# Patient Record
Sex: Male | Born: 1935 | Race: White | Hispanic: No | Smoking: Former smoker
Health system: Southern US, Community
[De-identification: ages and names within clinical notes are randomized; demographics above are authoritative.]

## PROBLEM LIST (undated history)

## (undated) DIAGNOSIS — I480 Paroxysmal atrial fibrillation: Secondary | ICD-10-CM

## (undated) DIAGNOSIS — I2510 Atherosclerotic heart disease of native coronary artery without angina pectoris: Secondary | ICD-10-CM

## (undated) DIAGNOSIS — W19XXXA Unspecified fall, initial encounter: Secondary | ICD-10-CM

## (undated) DIAGNOSIS — N400 Enlarged prostate without lower urinary tract symptoms: Secondary | ICD-10-CM

## (undated) DIAGNOSIS — E039 Hypothyroidism, unspecified: Secondary | ICD-10-CM

## (undated) DIAGNOSIS — E785 Hyperlipidemia, unspecified: Secondary | ICD-10-CM

## (undated) DIAGNOSIS — C61 Malignant neoplasm of prostate: Secondary | ICD-10-CM

## (undated) DIAGNOSIS — Z978 Presence of other specified devices: Secondary | ICD-10-CM

## (undated) DIAGNOSIS — J209 Acute bronchitis, unspecified: Secondary | ICD-10-CM

## (undated) DIAGNOSIS — N390 Urinary tract infection, site not specified: Secondary | ICD-10-CM

## (undated) DIAGNOSIS — Z960 Presence of urogenital implants: Secondary | ICD-10-CM

## (undated) DIAGNOSIS — F0390 Unspecified dementia without behavioral disturbance: Secondary | ICD-10-CM

## (undated) HISTORY — PX: ANKLE SURGERY: SHX546

## (undated) HISTORY — PX: CORONARY ARTERY BYPASS GRAFT: SHX141

## (undated) HISTORY — PX: BONE GRAFT HIP ILIAC CREST: SUR159

---

## 1998-08-23 ENCOUNTER — Encounter: Payer: Self-pay | Admitting: Emergency Medicine

## 1998-08-23 ENCOUNTER — Emergency Department (HOSPITAL_COMMUNITY): Admission: EM | Admit: 1998-08-23 | Discharge: 1998-08-23 | Payer: Self-pay | Admitting: *Deleted

## 2005-02-27 ENCOUNTER — Encounter: Admission: RE | Admit: 2005-02-27 | Discharge: 2005-02-27 | Payer: Self-pay | Admitting: Internal Medicine

## 2005-03-12 ENCOUNTER — Ambulatory Visit: Payer: Self-pay | Admitting: Internal Medicine

## 2005-03-30 ENCOUNTER — Ambulatory Visit: Payer: Self-pay | Admitting: Internal Medicine

## 2005-03-30 ENCOUNTER — Encounter (INDEPENDENT_AMBULATORY_CARE_PROVIDER_SITE_OTHER): Payer: Self-pay | Admitting: Specialist

## 2005-06-26 ENCOUNTER — Ambulatory Visit (HOSPITAL_COMMUNITY): Admission: RE | Admit: 2005-06-26 | Discharge: 2005-06-26 | Payer: Self-pay | Admitting: Orthopedic Surgery

## 2005-06-26 ENCOUNTER — Ambulatory Visit (HOSPITAL_BASED_OUTPATIENT_CLINIC_OR_DEPARTMENT_OTHER): Admission: RE | Admit: 2005-06-26 | Discharge: 2005-06-26 | Payer: Self-pay | Admitting: Orthopedic Surgery

## 2005-07-19 ENCOUNTER — Ambulatory Visit (HOSPITAL_COMMUNITY): Admission: RE | Admit: 2005-07-19 | Discharge: 2005-07-19 | Payer: Self-pay | Admitting: Orthopedic Surgery

## 2005-07-19 ENCOUNTER — Ambulatory Visit (HOSPITAL_BASED_OUTPATIENT_CLINIC_OR_DEPARTMENT_OTHER): Admission: RE | Admit: 2005-07-19 | Discharge: 2005-07-19 | Payer: Self-pay | Admitting: Orthopedic Surgery

## 2005-09-04 ENCOUNTER — Emergency Department (HOSPITAL_COMMUNITY): Admission: EM | Admit: 2005-09-04 | Discharge: 2005-09-04 | Payer: Self-pay | Admitting: Family Medicine

## 2007-05-07 ENCOUNTER — Encounter: Admission: RE | Admit: 2007-05-07 | Discharge: 2007-05-07 | Payer: Self-pay | Admitting: Urology

## 2011-02-02 NOTE — Op Note (Signed)
NAME:  [name], [name] NO.:  [id number]   MEDICAL RECORD NO.:  [id number]          PATIENT TYPE:  AMB   LOCATION:  DSC                          FACILITY:  [facility]   PHYSICIAN:  Adam Nielsen, M.D. DATE OF BIRTH:  27-Jun-1925   DATE OF PROCEDURE:  06/26/2005  DATE OF DISCHARGE:                                 OPERATIVE REPORT   PREOPERATIVE DIAGNOSIS:  Right carpal tunnel syndrome, severe.   POSTOPERATIVE DIAGNOSIS:  Right carpal tunnel syndrome, severe.   OPERATIONS:  Release of right transverse carpal ligament.   OPERATIONS:  Adam Nielsen, M.D.   ASSISTANT:  Adam Maduro Dasnoit PA-C.   ANESTHESIA:  General by LMA, supervising anesthesiologist is Adam Nielsen.   INDICATIONS:  Adam Nielsen is a 77 year old gentleman referred through the  courtesy of Adam Nielsen, M.D., for evaluation and management of hand  numbness and discomfort.   Clinical examination revealed signs of chronic carpal tunnel syndrome and  thenar atrophy.   Electrodiagnostic studies completed by Adam Nielsen revealed severe right  carpal tunnel syndrome and moderate left carpal tunnel syndrome.   Due to a failure to respond to nonoperative measures, he is brought to the  operating room at this time for release of his right transverse carpal  ligament.   PROCEDURE:  Adam Nielsen is brought to the operating room and placed in the  supine position on the operating table.   Following an anesthesia consultation by Adam Nielsen, general anesthesia by LMA  technique was selected.   Following induction under Adam Nielsen direct supervision, the right arm was  prepped with Betadine soap and solution and sterilely draped.   The right arm was exsanguinated with an Esmarch bandage, an arterial  tourniquet on the proximal brachium inflated to 220 mmHg.  The procedure  commenced with a short incision in the line of the ring finger in the palm.  The subcutaneous tissues were carefully divided  revealing the palmar fascia.  Adam Nielsen had a variant of palmar fascia where he had essentially two  discrete layers, one an extension of the palmaris longus, the second a  fascial layer that blended with the transverse carpal ligament distally.   The transverse carpal ligament distal margin was carefully dissected free,  followed by release of the volar forearm fascia as a single layer proximally  in the forearm and after identification of the common sensory branches of  the median nerve, the ulnar aspect of the transverse carpal ligament was  released with scissors under direct vision into the distal forearm.   This widely opened carpal canal.   One interesting notation during surgery was that Adam Nielsen had chronic  fasciculations of his hypothenar and thenar muscles.   There has been no mention of this predicament previously.  He did not have  an EMG provided preoperatively.   The wound was inspected for bleeding points, which were electrocauterized  with bipolar current, followed by repair of the skin with intradermal 3-0  Prolene suture.   A compressive dressing was applied with a volar plaster splint maintaining  the wrist in 5 degrees  of dorsiflexion.   For aftercare, he is provided a prescription for Percocet 5 mg one .p.o.a.4-  66h. p.r.n. pain, a total of 20 tablets without refill.   He is discharged to the care of his family with instructions to return to  our office for follow-up in six to eight days.      Adam Fitch Nielsen, M.D.  Electronically Signed     RVS/MEDQ  D:  06/26/2005  T:  06/26/2005  Job:  [id number]   cc:   Adam Nielsen, M.D.  Fax: [phone]

## 2011-02-02 NOTE — Op Note (Signed)
NAME:  Adam Nielsen, Adam Nielsen NO.:  [id number]   MEDICAL RECORD NO.:  [id number]          PATIENT TYPE:  AMB   LOCATION:  DSC                          FACILITY:  [facility]   PHYSICIAN:  Katy Fitch. Sypher, M.D. DATE OF BIRTH:  1925-06-04   DATE OF PROCEDURE:  07/19/2005  DATE OF DISCHARGE:                                 OPERATIVE REPORT   PREOP DIAGNOSIS:  Entrapment neuropathy, median nerve left carpal tunnel.   POSTOP DIAGNOSIS:  Entrapment neuropathy, median nerve left carpal tunnel.   OPERATION:  Release of left transverse carpal ligament.   OPERATIONS:  Josephine Igo.   ASSISTANT:  Annye Rusk PA-C.   ANESTHESIA:  General by LMA supervising anesthesiologist is Dr. Krista Blue.   INDICATIONS:  Martese Vanatta is a 77 year old gentleman referred by Dr. Othelia Pulling for evaluation and management of a painful and numb left hand.  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 Electrodiagnostic  confirmed significant carpal tunnel syndrome.   Due to failure to respond to nonoperative measures, he is brought to the  operating at this time for release of left transverse carpal ligament.   PROCEDURE:  Jakari Sada is brought to the operating room and placed supine  position on the operating table.   Following induction general anesthesia by LMA technique, the left arm was  prepped with Betadine soap solution, sterilely draped.   Following exsanguination of limb with Esmarch bandage, arterial tourniquet  was inflated to 220 mmHg. The procedure commenced with short incision in  line of the ring finger in the palm. The subcutaneous tissues were carefully  divided to revealing palmar fascia. This was noted to be double layered as  it was on the right side previously. After rather detailed dissection of the  distal margin of the transverse carpal ligament and palmar fascia, there was  noted to be a connection between the palmaris longus and the palmar fascia  that was two layered.   Care was taken to identify the motor branch of the median nerve distally and  separate the fascia layers from the motor branch. The superficial palmar  arch and ulnar artery were isolated.   The transverse carpal ligament distal margin was then identified and  subsequently released with scissors including release of the entire  transverse carpal ligament along its ulnar border extending into the distal  forearm.   This widely opened the carpal canal. The contents of the carpal canal  inspected and found to reveal no masses or other predicaments other than a  fibrotic ulnar bursa.   Bleeding points along the margin of the released ligament were  electrocauterized with bipolar current followed by repair of the palmar skin  wound with intradermal 3-0 Prolene suture.   A compressive dressing was applied with a volar plaster splint maintaining  the wrist in 5 degrees of dorsiflexion. There no apparent complications.   Mr. Keady tolerated surgery and anesthesia well. He was transferred to  recovery room with stable signs.   He will be discharged to the  care of his family with prescription for  Vicodin 5 milligrams one p.o. q. 4 to 6 hours p.r.n. pain 20 tablets without  refill.      Katy Fitch Sypher, M.D.  Electronically Signed     RVS/MEDQ  D:  07/19/2005  T:  07/19/2005  Job:  [id number]   cc:   Geoffry Paradise, M.D.  Fax: [phone]

## 2011-05-22 ENCOUNTER — Inpatient Hospital Stay (INDEPENDENT_AMBULATORY_CARE_PROVIDER_SITE_OTHER)
Admission: RE | Admit: 2011-05-22 | Discharge: 2011-05-22 | Disposition: A | Discharge status: Home / Self Care | Payer: Medicare Other | Source: Ambulatory Visit | Attending: Family Medicine | Admitting: Family Medicine

## 2011-05-22 DIAGNOSIS — R339 Retention of urine, unspecified: Secondary | ICD-10-CM

## 2012-01-28 ENCOUNTER — Other Ambulatory Visit: Payer: Self-pay | Admitting: Cardiology

## 2012-01-28 ENCOUNTER — Ambulatory Visit
Admission: RE | Admit: 2012-01-28 | Discharge: 2012-01-28 | Disposition: A | Discharge status: Home / Self Care | Payer: Medicare Other | Source: Ambulatory Visit | Attending: Cardiology | Admitting: Cardiology

## 2012-01-28 DIAGNOSIS — R0789 Other chest pain: Secondary | ICD-10-CM

## 2012-08-07 ENCOUNTER — Other Ambulatory Visit (HOSPITAL_COMMUNITY): Payer: Self-pay | Admitting: Urology

## 2012-08-07 DIAGNOSIS — C61 Malignant neoplasm of prostate: Secondary | ICD-10-CM

## 2012-08-29 ENCOUNTER — Encounter (HOSPITAL_COMMUNITY)
Admission: RE | Admit: 2012-08-29 | Discharge: 2012-08-29 | Disposition: A | Discharge status: Home / Self Care | Payer: Medicare Other | Source: Ambulatory Visit | Attending: Urology | Admitting: Urology

## 2012-08-29 DIAGNOSIS — C61 Malignant neoplasm of prostate: Secondary | ICD-10-CM | POA: Insufficient documentation

## 2012-08-29 DIAGNOSIS — C7951 Secondary malignant neoplasm of bone: Secondary | ICD-10-CM | POA: Insufficient documentation

## 2012-08-29 DIAGNOSIS — C7952 Secondary malignant neoplasm of bone marrow: Secondary | ICD-10-CM | POA: Insufficient documentation

## 2012-08-29 MED ORDER — TECHNETIUM TC 99M MEDRONATE IV KIT
24.8000 | PACK | Freq: Once | INTRAVENOUS | Status: AC | PRN
  Administered 2012-08-29: 24.8 via INTRAVENOUS

## 2014-12-23 ENCOUNTER — Emergency Department (HOSPITAL_COMMUNITY): Payer: No Typology Code available for payment source

## 2014-12-23 ENCOUNTER — Encounter (HOSPITAL_COMMUNITY): Payer: Self-pay

## 2014-12-23 ENCOUNTER — Emergency Department (HOSPITAL_COMMUNITY)
Admission: EM | Admit: 2014-12-23 | Discharge: 2014-12-23 | Disposition: A | Discharge status: Home / Self Care | Payer: No Typology Code available for payment source | Attending: Emergency Medicine | Admitting: Emergency Medicine

## 2014-12-23 DIAGNOSIS — Z8679 Personal history of other diseases of the circulatory system: Secondary | ICD-10-CM | POA: Diagnosis not present

## 2014-12-23 DIAGNOSIS — Y92410 Unspecified street and highway as the place of occurrence of the external cause: Secondary | ICD-10-CM | POA: Diagnosis not present

## 2014-12-23 DIAGNOSIS — S3991XA Unspecified injury of abdomen, initial encounter: Secondary | ICD-10-CM | POA: Diagnosis present

## 2014-12-23 DIAGNOSIS — S301XXA Contusion of abdominal wall, initial encounter: Secondary | ICD-10-CM

## 2014-12-23 DIAGNOSIS — Z951 Presence of aortocoronary bypass graft: Secondary | ICD-10-CM | POA: Diagnosis not present

## 2014-12-23 DIAGNOSIS — Y9389 Activity, other specified: Secondary | ICD-10-CM | POA: Diagnosis not present

## 2014-12-23 DIAGNOSIS — Y998 Other external cause status: Secondary | ICD-10-CM | POA: Insufficient documentation

## 2014-12-23 DIAGNOSIS — Z87891 Personal history of nicotine dependence: Secondary | ICD-10-CM | POA: Diagnosis not present

## 2014-12-23 LAB — CBC WITH DIFFERENTIAL/PLATELET
Basophils Absolute: 0 10*3/uL (ref 0.0–0.1)
Basophils Relative: 1 % (ref 0–1)
EOS PCT: 4 % (ref 0–5)
Eosinophils Absolute: 0.2 10*3/uL (ref 0.0–0.7)
HCT: 38.1 % — ABNORMAL LOW (ref 39.0–52.0)
Hemoglobin: 13.3 g/dL (ref 13.0–17.0)
Lymphocytes Relative: 20 % (ref 12–46)
Lymphs Abs: 1.1 10*3/uL (ref 0.7–4.0)
MCH: 31.2 pg (ref 26.0–34.0)
MCHC: 34.9 g/dL (ref 30.0–36.0)
MCV: 89.4 fL (ref 78.0–100.0)
Monocytes Absolute: 0.4 10*3/uL (ref 0.1–1.0)
Monocytes Relative: 6 % (ref 3–12)
Neutro Abs: 3.9 10*3/uL (ref 1.7–7.7)
Neutrophils Relative %: 69 % (ref 43–77)
Platelets: 212 10*3/uL (ref 150–400)
RBC: 4.26 MIL/uL (ref 4.22–5.81)
RDW: 13.7 % (ref 11.5–15.5)
WBC: 5.6 10*3/uL (ref 4.0–10.5)

## 2014-12-23 LAB — COMPREHENSIVE METABOLIC PANEL
ALT: 11 U/L (ref 0–53)
ANION GAP: 11 (ref 5–15)
AST: 17 U/L (ref 0–37)
Albumin: 3.5 g/dL (ref 3.5–5.2)
Alkaline Phosphatase: 52 U/L (ref 39–117)
BUN: 16 mg/dL (ref 6–23)
CHLORIDE: 105 mmol/L (ref 96–112)
CO2: 21 mmol/L (ref 19–32)
CREATININE: 1.29 mg/dL (ref 0.50–1.35)
Calcium: 9.6 mg/dL (ref 8.4–10.5)
GFR calc Af Amer: 55 mL/min — ABNORMAL LOW (ref >90)
GFR calc non Af Amer: 48 mL/min — ABNORMAL LOW (ref >90)
Glucose, Bld: 142 mg/dL — ABNORMAL HIGH (ref 70–99)
Potassium: 4.2 mmol/L (ref 3.5–5.1)
Sodium: 137 mmol/L (ref 135–145)
TOTAL PROTEIN: 6.6 g/dL (ref 6.0–8.3)
Total Bilirubin: 0.4 mg/dL (ref 0.3–1.2)

## 2014-12-23 LAB — PROTIME-INR
INR: 0.97 (ref 0.00–1.49)
Prothrombin Time: 13 seconds (ref 11.6–15.2)

## 2014-12-23 MED ORDER — IOHEXOL 300 MG/ML  SOLN
100.0000 mL | Freq: Once | INTRAMUSCULAR | Status: AC | PRN
  Administered 2014-12-23: 100 mL via INTRAVENOUS

## 2014-12-23 NOTE — ED Notes (Signed)
Resident at bedside.  
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
## 2014-12-23 NOTE — Discharge Instructions (Signed)
Blunt Abdominal Trauma A blunt injury to the abdomen can cause pain. The pain is most likely from bruising and stretching of your muscles. This pain is often made worse with movement. Most often these injuries are not serious and get better within 1 week with rest and mild pain medicine. However, internal organs (liver, spleen, kidneys) can be injured with blunt trauma. If you do not get better or if you get worse, further examination may be needed. Continue with your regular daily activities, but avoid any strenuous activities until your pain is improved. If your stomach is upset, stick to a clear liquid diet and slowly advance to solid food.  SEEK IMMEDIATE MEDICAL CARE IF:   You develop increasing pain, nausea, or repeated vomiting.  You develop chest pain or breathing difficulty.  You develop blood in the urine, vomit, or stool.  You develop weakness, fainting, fever, or other serious complaints. Document Released: 10/11/2004 Document Revised: 11/26/2011 Document Reviewed: 01/27/2009 [facility] Patient Information 2015 [place], [place]. This information is not intended to replace advice given to you by your health care provider. Make sure you discuss any questions you have with your health care provider.

## 2014-12-27 ENCOUNTER — Ambulatory Visit: Payer: Medicare Other | Admitting: Podiatry

## 2015-08-10 ENCOUNTER — Other Ambulatory Visit: Payer: Self-pay | Admitting: Urology

## 2015-08-10 DIAGNOSIS — M810 Age-related osteoporosis without current pathological fracture: Secondary | ICD-10-CM

## 2015-09-13 ENCOUNTER — Encounter (HOSPITAL_COMMUNITY): Payer: Self-pay | Admitting: Emergency Medicine

## 2015-09-13 ENCOUNTER — Emergency Department (HOSPITAL_COMMUNITY): Payer: Medicare Other

## 2015-09-13 ENCOUNTER — Emergency Department (HOSPITAL_COMMUNITY)
Admission: EM | Admit: 2015-09-13 | Discharge: 2015-09-13 | Disposition: A | Discharge status: Home / Self Care | Payer: Medicare Other | Attending: Emergency Medicine | Admitting: Emergency Medicine

## 2015-09-13 DIAGNOSIS — Z8546 Personal history of malignant neoplasm of prostate: Secondary | ICD-10-CM | POA: Insufficient documentation

## 2015-09-13 DIAGNOSIS — Z79899 Other long term (current) drug therapy: Secondary | ICD-10-CM | POA: Insufficient documentation

## 2015-09-13 DIAGNOSIS — M25152 Fistula, left hip: Secondary | ICD-10-CM | POA: Insufficient documentation

## 2015-09-13 DIAGNOSIS — Z87891 Personal history of nicotine dependence: Secondary | ICD-10-CM | POA: Diagnosis not present

## 2015-09-13 DIAGNOSIS — Z7982 Long term (current) use of aspirin: Secondary | ICD-10-CM | POA: Diagnosis not present

## 2015-09-13 DIAGNOSIS — E039 Hypothyroidism, unspecified: Secondary | ICD-10-CM | POA: Insufficient documentation

## 2015-09-13 DIAGNOSIS — Z951 Presence of aortocoronary bypass graft: Secondary | ICD-10-CM | POA: Insufficient documentation

## 2015-09-13 DIAGNOSIS — I4891 Unspecified atrial fibrillation: Secondary | ICD-10-CM | POA: Insufficient documentation

## 2015-09-13 DIAGNOSIS — F0390 Unspecified dementia without behavioral disturbance: Secondary | ICD-10-CM | POA: Insufficient documentation

## 2015-09-13 DIAGNOSIS — L988 Other specified disorders of the skin and subcutaneous tissue: Secondary | ICD-10-CM

## 2015-09-13 DIAGNOSIS — M25852 Other specified joint disorders, left hip: Secondary | ICD-10-CM | POA: Diagnosis present

## 2015-09-13 HISTORY — DX: Malignant neoplasm of prostate: C61

## 2015-09-13 HISTORY — DX: Hypothyroidism, unspecified: E03.9

## 2015-09-13 NOTE — ED Provider Notes (Signed)
CSN: [id number]     Arrival date & time 09/13/15  1135 History   First MD Initiated Contact with Patient 09/13/15 1501     Chief Complaint  Patient presents with  . hip issue     The history is provided by the patient and a relative. No language interpreter was used.   Adam Nielsen is a 81 y.o. male who presents to the Emergency Department complaining of hole in his hip.  History is limited secondary to the patient's dementia. He has noticed a hole on his left hip several months ago.  He has noticed intermittent drainage for the area for months.  It is sometimes blood tinged.  He denies any fevers,abdominal pain, hip pain, pain with range of motion. He has a hx/o prostate cancer with known metastatic disease, not currently on any medications.  Past Medical History  Diagnosis Date  . Atrial fibrillation ([place])   . Hypothyroid   . Prostate CA [facility])    Past Surgical History  Procedure Laterality Date  . Coronary artery bypass graft     History reviewed. No pertinent family history. Social History  Substance Use Topics  . Smoking status: Former Research scientist (life sciences)  . Smokeless tobacco: None  . Alcohol Use: No    Review of Systems  All other systems reviewed and are negative.     Allergies  Review of patient's allergies indicates no known allergies.  Home Medications   Prior to Admission medications   Medication Sig Start Date End Date Taking? Authorizing Provider  aspirin 325 MG tablet Take 325 mg by mouth daily.   Yes Historical Provider, MD  diltiazem (CARDIZEM) 60 MG tablet Take 60 mg by mouth 2 (two) times daily.   Yes Historical Provider, MD  donepezil (ARICEPT) 10 MG tablet Take 10 mg by mouth at bedtime.   Yes Historical Provider, MD  finasteride (PROSCAR) 5 MG tablet Take 5 mg by mouth daily.   Yes Historical Provider, MD  levothyroxine (SYNTHROID, LEVOTHROID) 25 MCG tablet Take 50 mcg by mouth daily before breakfast.    Yes Historical Provider, MD  metoprolol tartrate  (LOPRESSOR) 25 MG tablet Take 25 mg by mouth daily.    Yes Historical Provider, MD  niacin 500 MG tablet Take 500 mg by mouth at bedtime.   Yes Historical Provider, MD  silodosin (RAPAFLO) 8 MG CAPS capsule Take 8 mg by mouth daily with breakfast.   Yes Historical Provider, MD   BP 135/78 mmHg  Pulse 74  Temp(Src) 97.4 F (36.3 C) (Oral)  Resp 17  SpO2 100% Physical Exam  Constitutional: He is oriented to person, place, and time. He appears well-developed and well-nourished.  HENT:  Head: Normocephalic and atraumatic.  Cardiovascular: Normal rate and regular rhythm.   No murmur heard. Pulmonary/Chest: Effort normal and breath sounds normal. No respiratory distress.  Abdominal: Soft. There is no tenderness. There is no rebound and no guarding.  Musculoskeletal: He exhibits no edema or tenderness.  The left lateral hip just inferior to the iliac crest with a invagination of the skin that looks like a fistula. There is no active drainage or erythema. No tenderness to the area. Full range of motion in the hip.  Neurological: He is alert and oriented to person, place, and time.  Skin: Skin is warm and dry.  Psychiatric: He has a normal mood and affect. His behavior is normal.  Nursing note and vitals reviewed.   ED Course  Procedures (including critical care time) Labs Review  Labs Reviewed - No data to display  Imaging Review Dg Hip Unilat With Pelvis 2-3 Views Left  09/13/2015  CLINICAL DATA:  Left hip pain.  Status post fall. EXAM: DG HIP (WITH OR WITHOUT PELVIS) 2-3V LEFT COMPARISON:  None. FINDINGS: There is no evidence of hip fracture or dislocation. There is old posttraumatic deformity of the left ilium. There is bridging heterotopic ossification along the medial aspect of the proximal right femur. There are mild degenerative changes of bilateral SI joints. There is a sclerotic lesion in the left superior acetabulum and other faint sclerotic lesions in the left ilium concerning for  metastatic prostate cancer. IMPRESSION: No acute osseous injury of the left hip. Electronically Signed   By: Kathreen Devoid   On: 09/13/2015 12:27   I have personally reviewed and evaluated these images and lab results as part of my medical decision-making.   EKG Interpretation None      MDM   Final diagnoses:  Fistula    Patient here for evaluation of hole in his left hip. He states this is been present for several months but his family just noticed it today. There is no current evidence of infection. Discussed with patient that this is likely a fistula or an invagination of the skin. Discussed outpatient follow-up with urology and potentially orthopedics. Home care and return precautions for evidence of infection were discussed.    Quintella Reichert, MD 09/14/15 1455

## 2015-09-13 NOTE — Discharge Instructions (Signed)
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  Keep the area clean and dry.  If you develop fevers, pain, or new concerning symptoms please get rechecked right away.

## 2015-09-13 NOTE — ED Notes (Signed)
Awake. Verbally responsive. A/O x4. Resp even and unlabored. No audible adventitious breath sounds noted. ABC's intact.  

## 2015-09-13 NOTE — ED Notes (Signed)
Pt noticed he has a 1cm "hole" in the side of his L hip. Denies any injury or hip procedures. Skin intact, no drainage.

## 2015-09-13 NOTE — ED Notes (Signed)
Awake. Verbally responsive. A/O x2. Resp even and unlabored. No audible adventitious breath sounds noted. ABC's intact. Pt ambulated to BR with steady gait. Family at bedside.

## 2015-09-13 NOTE — ED Notes (Signed)
Pt and family reported an open area to lt hip at old surgical site. Pt reported area has drainage but does not know the color. Denies fever/odor/pain. (+)PMS, CRT brisk, full ROM, pt denies injury/trauma, no swelling/bruising/deformity noted. Noted slight redness and superficial abrasion to lt hip.

## 2015-09-16 ENCOUNTER — Other Ambulatory Visit: Payer: Medicare Other

## 2015-10-19 ENCOUNTER — Ambulatory Visit
Admission: RE | Admit: 2015-10-19 | Discharge: 2015-10-19 | Disposition: A | Discharge status: Home / Self Care | Payer: Medicare Other | Source: Ambulatory Visit | Attending: Urology | Admitting: Urology

## 2015-10-19 DIAGNOSIS — M810 Age-related osteoporosis without current pathological fracture: Secondary | ICD-10-CM

## 2015-10-21 ENCOUNTER — Encounter: Payer: Self-pay | Admitting: Emergency Medicine

## 2015-10-21 ENCOUNTER — Emergency Department
Admission: EM | Admit: 2015-10-21 | Discharge: 2015-10-21 | Disposition: A | Discharge status: Home / Self Care | Payer: Medicare Other | Attending: Emergency Medicine | Admitting: Emergency Medicine

## 2015-10-21 DIAGNOSIS — N390 Urinary tract infection, site not specified: Secondary | ICD-10-CM | POA: Diagnosis not present

## 2015-10-21 DIAGNOSIS — Z87891 Personal history of nicotine dependence: Secondary | ICD-10-CM | POA: Diagnosis not present

## 2015-10-21 DIAGNOSIS — R338 Other retention of urine: Secondary | ICD-10-CM

## 2015-10-21 DIAGNOSIS — R339 Retention of urine, unspecified: Secondary | ICD-10-CM | POA: Diagnosis present

## 2015-10-21 LAB — URINALYSIS COMPLETE WITH MICROSCOPIC (ARMC ONLY)
Bacteria, UA: NONE SEEN
Bilirubin Urine: NEGATIVE
GLUCOSE, UA: NEGATIVE mg/dL
KETONES UR: NEGATIVE mg/dL
NITRITE: NEGATIVE
PH: 5 (ref 5.0–8.0)
Protein, ur: 30 mg/dL — AB
Specific Gravity, Urine: 1.014 (ref 1.005–1.030)
Squamous Epithelial / LPF: NONE SEEN

## 2015-10-21 LAB — BASIC METABOLIC PANEL
Anion gap: 10 (ref 5–15)
BUN: 22 mg/dL — ABNORMAL HIGH (ref 6–20)
CHLORIDE: 106 mmol/L (ref 101–111)
CO2: 23 mmol/L (ref 22–32)
CREATININE: 1.32 mg/dL — AB (ref 0.61–1.24)
Calcium: 9.6 mg/dL (ref 8.9–10.3)
GFR calc Af Amer: 54 mL/min — ABNORMAL LOW (ref >60)
GFR, EST NON AFRICAN AMERICAN: 46 mL/min — AB (ref >60)
Glucose, Bld: 111 mg/dL — ABNORMAL HIGH (ref 65–99)
Potassium: 3.9 mmol/L (ref 3.5–5.1)
SODIUM: 139 mmol/L (ref 135–145)

## 2015-10-21 LAB — CBC
HCT: 39.3 % — ABNORMAL LOW (ref 40.0–52.0)
Hemoglobin: 13.1 g/dL (ref 13.0–18.0)
MCH: 31.2 pg (ref 26.0–34.0)
MCHC: 33.3 g/dL (ref 32.0–36.0)
MCV: 93.7 fL (ref 80.0–100.0)
Platelets: 196 10*3/uL (ref 150–440)
RBC: 4.2 MIL/uL — ABNORMAL LOW (ref 4.40–5.90)
RDW: 13.7 % (ref 11.5–14.5)
WBC: 8.5 10*3/uL (ref 3.8–10.6)

## 2015-10-21 MED ORDER — SULFAMETHOXAZOLE-TRIMETHOPRIM 800-160 MG PO TABS: 1.0000 | ORAL_TABLET | Freq: Two times a day (BID) | ORAL | Status: DC

## 2015-10-21 MED ORDER — DEXTROSE 5 % IV SOLN
1.0000 g | Freq: Once | INTRAVENOUS | Status: AC
  Administered 2015-10-21: 1 g via INTRAVENOUS

## 2015-10-21 MED ORDER — DEXTROSE 5 % IV SOLN
INTRAVENOUS | Status: AC
  Administered 2015-10-21: 1 g via INTRAVENOUS
  Filled 2015-10-21: qty 10

## 2015-10-21 NOTE — ED Notes (Addendum)
Patient from [facility] with c/o urinary retention for 24 hours. Current prostate cancer

## 2015-10-21 NOTE — ED Provider Notes (Signed)
[name] Provider Note     Time seen: ----------------------------------------- 7:44 PM on 10/21/2015 -----------------------------------------  L5 caveat: Review of systems and history is limited by dementia  I have reviewed the triage vital signs and the nursing notes.   HISTORY  Chief Complaint Urinary Retention    HPI Adam Nielsen is a 82 y.o. male who presents to ER for urinary retention for the past 24 hours. Patient does currently have prostate cancer and is receiving treatment for this. Patient states he hasn't been able to make urine since yesterday, otherwise he denies any complaints, fever or abdominal pain.   Past Medical History  Diagnosis Date  . Atrial fibrillation ([place])   . Hypothyroid   . Prostate CA ([place])     There are no active problems to display for this patient.   Past Surgical History  Procedure Laterality Date  . Coronary artery bypass graft      Allergies Review of patient's allergies indicates no known allergies.  Social History Social History  Substance Use Topics  . Smoking status: Former Research scientist (life sciences)  . Smokeless tobacco: None  . Alcohol Use: No    Review of Systems Genitourinary: Positive urinary retention Review of systems otherwise unknown  ____________________________________________   PHYSICAL EXAM:  VITAL SIGNS: ED Triage Vitals  Enc Vitals Group     BP 10/21/15 1913 128/76 mmHg     Pulse Rate 10/21/15 1913 74     Resp 10/21/15 1913 18     Temp 10/21/15 1913 97.8 F (36.6 C)     Temp src --      SpO2 10/21/15 1913 98 %     Weight 10/21/15 1913 160 lb (72.576 kg)     Height --      Head Cir --      Peak Flow --      Pain Score --      Pain Loc --      Pain Edu? --      Excl. in [place]? --     Constitutional: Alert but disoriented. Well appearing and in no distress. Eyes: Conjunctivae are normal. PERRL. Normal extraocular movements. ENT   Head: Normocephalic  and atraumatic.   Nose: No congestion/rhinnorhea.   Mouth/Throat: Mucous membranes are moist.   Neck: No stridor. Cardiovascular: Normal rate, regular rhythm. Normal and symmetric distal pulses are present in all extremities. No murmurs, rubs, or gallops. Respiratory: Normal respiratory effort without tachypnea nor retractions. Breath sounds are clear and equal bilaterally. No wheezes/rales/rhonchi. Gastrointestinal: Soft and nontender. No distention. No abdominal bruits.  Musculoskeletal: Nontender with normal range of motion in all extremities. No joint effusions.  No lower extremity tenderness Neurologic:  Normal speech and language. No gross focal neurologic deficits are appreciated. Speech is normal. Skin:  Skin is warm, dry and intact. No rash noted. ____________________________________________  ED COURSE:  Pertinent labs & imaging results that were available during my care of the patient were reviewed by me and considered in my medical decision making (see chart for details). Patient does not appear to be in any acute distress. Bladder scan shows over 315 MLS in the bladder. We will place a Foley catheter and check basic labs. ____________________________________________    LABS (pertinent positives/negatives)  Labs Reviewed  BASIC METABOLIC PANEL - Abnormal; Notable for the following:    Glucose, Bld 111 (*)    BUN 22 (*)    Creatinine, Ser 1.32 (*)    GFR calc non Af Wyvonnia Lora  46 (*)    GFR calc Af Amer 54 (*)    All other components within normal limits  CBC - Abnormal; Notable for the following:    RBC 4.20 (*)    HCT 39.3 (*)    All other components within normal limits  URINALYSIS COMPLETEWITH MICROSCOPIC ([facility] ONLY) - Abnormal; Notable for the following:    Color, Urine YELLOW (*)    APPearance CLOUDY (*)    Hgb urine dipstick 3+ (*)    Protein, ur 30 (*)    Leukocytes, UA 3+ (*)    All other components within normal limits  URINE CULTURE    ___________________________________________  FINAL ASSESSMENT AND PLAN  Acute urinary retention, UTI  Plan: Patient with labs and imaging as dictated above. I have sent off for urine culture, he's been given a gram of Rocephin. Foley catheter was placed which drained out over 400 cc of urine. We will leave this in place and have him follow-up with his urologist as an outpatient. He'll be discharged with Septra. He is in no acute distress, is afebrile with a normal white count.   Earleen Newport, MD   Earleen Newport, MD 10/21/15 2016

## 2015-10-21 NOTE — Discharge Instructions (Signed)
Acute Urinary Retention, Male Acute urinary retention is the temporary inability to urinate. This is a common problem in older men. As men age their prostates become larger and block the flow of urine from the bladder. This is usually a problem that has come on gradually.  HOME CARE INSTRUCTIONS If you are sent home with a Foley catheter and a drainage system, you will need to discuss the best course of action with your health care provider. While the catheter is in, maintain a good intake of fluids. Keep the drainage bag emptied and lower than your catheter. This is so that contaminated urine will not flow back into your bladder, which could lead to a urinary tract infection. There are two main types of drainage bags. One is a large bag that usually is used at night. It has a good capacity that will allow you to sleep through the night without having to empty it. The second type is called a leg bag. It has a smaller capacity, so it needs to be emptied more frequently. However, the main advantage is that it can be attached by a leg strap and can go underneath your clothing, allowing you the freedom to move about or leave your home. Only take over-the-counter or prescription medicines for pain, discomfort, or fever as directed by your health care provider.  SEEK MEDICAL CARE IF:  You develop a low-grade fever.  You experience spasms or leakage of urine with the spasms. SEEK IMMEDIATE MEDICAL CARE IF:   You develop chills or fever.  Your catheter stops draining urine.  Your catheter falls out.  You start to develop increased bleeding that does not respond to rest and increased fluid intake. MAKE SURE YOU:  Understand these instructions.  Will watch your condition.  Will get help right away if you are not doing well or get worse.   This information is not intended to replace advice given to you by your health care provider. Make sure you discuss any questions you have with your health care  provider.   Document Released: 12/10/2000 Document Revised: 01/18/2015 Document Reviewed: 02/12/2013 Elsevier Interactive Patient Education 2016 Elsevier Inc[employer]  Urinary Tract Infection Urinary tract infections (UTIs) can develop anywhere along your urinary tract. Your urinary tract is your body's drainage system for removing wastes and extra water. Your urinary tract includes two kidneys, two ureters, a bladder, and a urethra. Your kidneys are a pair of bean-shaped organs. Each kidney is about the size of your fist. They are located below your ribs, one on each side of your spine. CAUSES Infections are caused by microbes, which are microscopic organisms, including fungi, viruses, and bacteria. These organisms are so small that they can only be seen through a microscope. Bacteria are the microbes that most commonly cause UTIs. SYMPTOMS  Symptoms of UTIs may vary by age and gender of the patient and by the location of the infection. Symptoms in young women typically include a frequent and intense urge to urinate and a painful, burning feeling in the bladder or urethra during urination. Older women and men are more likely to be tired, shaky, and weak and have muscle aches and abdominal pain. A fever may mean the infection is in your kidneys. Other symptoms of a kidney infection include pain in your back or sides below the ribs, nausea, and vomiting. DIAGNOSIS To diagnose a UTI, your caregiver will ask you about your symptoms. Your caregiver will also ask you to provide a urine sample. The urine sample  sample will be tested for bacteria and white blood cells. White blood cells are made by your body to help fight infection. °TREATMENT  °Typically, UTIs can be treated with medication. Because most UTIs are caused by a bacterial infection, they usually can be treated with the use of antibiotics. The choice of antibiotic and length of treatment depend on your symptoms and the type of bacteria causing your  infection. °HOME CARE INSTRUCTIONS °· If you were prescribed antibiotics, take them exactly as your caregiver instructs you. Finish the medication even if you feel better after you have only taken some of the medication. °· Drink enough water and fluids to keep your urine clear or pale yellow. °· Avoid caffeine, tea, and carbonated beverages. They tend to irritate your bladder. °· Empty your bladder often. Avoid holding urine for long periods of time. °· Empty your bladder before and after sexual intercourse. °· After a bowel movement, women should cleanse from front to back. Use each tissue only once. °SEEK MEDICAL CARE IF:  °· You have back pain. °· You develop a fever. °· Your symptoms do not begin to resolve within 3 days. °SEEK IMMEDIATE MEDICAL CARE IF:  °· You have severe back pain or lower abdominal pain. °· You develop chills. °· You have nausea or vomiting. °· You have continued burning or discomfort with urination. °MAKE SURE YOU:  °· Understand these instructions. °· Will watch your condition. °· Will get help right away if you are not doing well or get worse. °  °This information is not intended to replace advice given to you by your health care provider. Make sure you discuss any questions you have with your health care provider. °  °Document Released: 06/13/2005 Document Revised: 05/25/2015 Document Reviewed: 10/12/2011 °Elsevier Interactive Patient Education ©2016 Elsevier Inc. ° °

## 2015-10-22 ENCOUNTER — Emergency Department
Admission: EM | Admit: 2015-10-22 | Discharge: 2015-10-22 | Disposition: A | Discharge status: Home / Self Care | Payer: Medicare Other | Attending: Emergency Medicine | Admitting: Emergency Medicine

## 2015-10-22 ENCOUNTER — Encounter: Payer: Self-pay | Admitting: Emergency Medicine

## 2015-10-22 DIAGNOSIS — Z79899 Other long term (current) drug therapy: Secondary | ICD-10-CM | POA: Insufficient documentation

## 2015-10-22 DIAGNOSIS — R339 Retention of urine, unspecified: Secondary | ICD-10-CM | POA: Diagnosis not present

## 2015-10-22 DIAGNOSIS — Z792 Long term (current) use of antibiotics: Secondary | ICD-10-CM | POA: Insufficient documentation

## 2015-10-22 DIAGNOSIS — Z7982 Long term (current) use of aspirin: Secondary | ICD-10-CM | POA: Diagnosis not present

## 2015-10-22 DIAGNOSIS — Z87891 Personal history of nicotine dependence: Secondary | ICD-10-CM | POA: Insufficient documentation

## 2015-10-22 DIAGNOSIS — Z87898 Personal history of other specified conditions: Secondary | ICD-10-CM

## 2015-10-22 NOTE — ED Notes (Signed)
Discussed discharge instructions and follow-up care with the patient and care giver (son). No questions or concerns at this time. Pt stable at discharge. Son transporting pt back to [facility].

## 2015-10-22 NOTE — Clinical Social Work Note (Signed)
Clinical Social Work Assessment  Patient Details  Name: Adam Nielsen MRN: [id number] Date of Birth: Nov 17, 1924  Date of referral:  10/22/15               Reason for consult:   (from [facility])                Permission sought to share information with:  Facility Sport and exercise psychologist, Family Supports Permission granted to share information::  Yes, Verbal Permission Granted  Name::      (son Adam Nielsen [phone])  Agency::     Relationship::     Contact Information:     Housing/Transportation Living arrangements for the past 2 months:  [place] of Information:  Medical Team, Facility, Adult Children Patient Interpreter Needed:  None Criminal Activity/Legal Involvement Pertinent to Current Situation/Hospitalization:    Significant Relationships:  Adult Children, Community Support Lives with:    Do you feel safe going back to the place where you live?  Yes Need for family participation in patient care:  Yes (Comment)  Care giving concerns:  None at this time   Facilities manager / plan:  Holiday representative (CSW) consult, patient is from [facility] and they will not allow him to return.   Patient was alert, oriented he min participated in conversation.  (Additional Information provided by son Adam Nielsen at bedside).  CSW introduced self and explained role of CSW department Patient has lived at [facility] for the past month, prior to [facility] patient lived home alone.  Per son would like patient to return but they will not allow him to return due to his foley.   Call to [facility], spoke to [place], states they are unable to take patient with the foley due to not having a RN on this weekend.  CSW informed Adam Nielsen that patient was not going to be admitted to the hospital and discussed other options for patient.     Adam Nielsen states she would need to made a phone call to her administrator and called CSW back. Return call from  [facility], states they are able to take patient back.   CSW spoke with patient's son Adam Nielsen who is in agreement for patient to return.  CSW spoke with EDP and provided an update that patient is able to return to [facility].   Employment status:    Insurance information:    PT Recommendations:  Not assessed at this time Information / Referral to community resources:   (none at this time)  Patient/Family's Response to care:  Patient's son was appreciative of information provided by CSW and assisting to get patient back to facility.   Patient/Family's Understanding of and Emotional Response to Diagnosis, Current Treatment, and Prognosis:  Patient will discharge back to ALF.  Emotional Assessment Appearance:  Appears stated age Attitude/Demeanor/Rapport:    Affect (typically observed):  Accepting, Adaptable, Pleasant, Calm Orientation:  Oriented to Self, Oriented to Place, Oriented to Situation Alcohol / Substance use:    Psych involvement (Current and /or in the community):  No (Comment)  Discharge Needs  Concerns to be addressed:  Care Coordination Readmission within the last 30 days:  No Current discharge risk:  Chronically ill, Cognitively Impaired Barriers to Discharge:  Barriers Resolved   Adam Cane, LCSW 10/22/2015, 2:16 PM

## 2015-10-22 NOTE — ED Provider Notes (Signed)
[facility] Emergency Department Provider Note  ____________________________________________  Time seen: On arrival  I have reviewed the triage vital signs and the nursing notes.   HISTORY  Chief Complaint Urinary Retention    HPI Adam Nielsen is a 82 y.o. male who presents to the emergency department because he was told that he could not be at [facility] with Foley catheter which was placed in the emergency department last night for urinary retention. He has no new symptoms. His son brought him to the emergency department in hopes of finding a solution    Past Medical History  Diagnosis Date  . Atrial fibrillation ([place])   . Hypothyroid   . Prostate CA ([place])     There are no active problems to display for this patient.   Past Surgical History  Procedure Laterality Date  . Coronary artery bypass graft      Current Outpatient Rx  Name  Route  Sig  Dispense  Refill  . aspirin 325 MG tablet   Oral   Take 325 mg by mouth daily.         Marland Kitchen diltiazem (CARDIZEM) 60 MG tablet   Oral   Take 60 mg by mouth 2 (two) times daily.         Marland Kitchen donepezil (ARICEPT) 10 MG tablet   Oral   Take 10 mg by mouth at bedtime.         . finasteride (PROSCAR) 5 MG tablet   Oral   Take 5 mg by mouth daily.         Marland Kitchen levothyroxine (SYNTHROID, LEVOTHROID) 25 MCG tablet   Oral   Take 50 mcg by mouth daily before breakfast.          . metoprolol tartrate (LOPRESSOR) 25 MG tablet   Oral   Take 25 mg by mouth daily.          . niacin 500 MG tablet   Oral   Take 500 mg by mouth at bedtime.         . silodosin (RAPAFLO) 8 MG CAPS capsule   Oral   Take 8 mg by mouth daily with breakfast.         . sulfamethoxazole-trimethoprim (BACTRIM DS) 800-160 MG tablet   Oral   Take 1 tablet by mouth 2 (two) times daily.   20 tablet   0     Allergies Review of patient's allergies indicates no known allergies.  History reviewed.  No pertinent family history.  Social History Social History  Substance Use Topics  . Smoking status: Former Research scientist (life sciences)  . Smokeless tobacco: None  . Alcohol Use: No    Review of Systems  Constitutional: Negative for fever.    Genitourinary: Negative for dysuria. Musculoskeletal: Negative for back pain.    ____________________________________________   PHYSICAL EXAM:  VITAL SIGNS: ED Triage Vitals  Enc Vitals Group     BP 10/22/15 1108 124/78 mmHg     Pulse Rate 10/22/15 1108 94     Resp 10/22/15 1108 20     Temp 10/22/15 1108 97.5 F (36.4 C)     Temp Source 10/22/15 1108 Tympanic     SpO2 10/22/15 1108 98 %     Weight 10/22/15 1108 160 lb (72.576 kg)     Height 10/22/15 1108 5\' 7"  (1.702 m)     Head Cir --      Peak Flow --      Pain Score --  Pain Loc --      Pain Edu? --      Excl. in [place]? --      Constitutional: Alert. Well appearing and in no distress. Eyes: Conjunctivae are normal.  ENT   Head: Normocephalic and atraumatic.   Mouth/Throat: Mucous membranes are moist. Cardiovascular: Normal rate, regular rhythm.  Respiratory: Normal respiratory effort without tachypnea nor retractions.  Gastrointestinal: Soft and non-tender in all quadrants. No distention. There is no CVA tenderness. Musculoskeletal: Nontender with normal range of motion in all extremities. Neurologic:  Normal speech and language. No gross focal neurologic deficits are appreciated. Skin:  Skin is warm, dry and intact. No rash noted. Psychiatric: Mood and affect are normal. Patient exhibits appropriate insight and judgment.  ____________________________________________    LABS (pertinent positives/negatives)  Labs Reviewed - No data to display  ____________________________________________     ____________________________________________    RADIOLOGY I have personally reviewed any xrays that were ordered on this  patient: None  ____________________________________________   PROCEDURES  Procedure(s) performed: none   ____________________________________________   INITIAL IMPRESSION / ASSESSMENT AND PLAN / ED COURSE  Pertinent labs & imaging results that were available during my care of the patient were reviewed by me and considered in my medical decision making (see chart for details).  Unclear why patient was told he could not be an assisted living with Foley. I consulted social work to assist [place] with the situation  Social work discussed with Estate manager/land agent who reports the patient is fine to go back there  ____________________________________________   FINAL CLINICAL IMPRESSION(S) / ED DIAGNOSES  Final diagnoses:  H/O urinary retention     Lavonia Drafts, MD 10/22/15 1540

## 2015-10-22 NOTE — ED Notes (Signed)
Per MD, social work consult called. CSW has 2 other cases first and then will be down to speak with pt and son. Family/pt made aware.

## 2015-10-22 NOTE — ED Notes (Signed)
Pt to ed with c/o urinary retention last night.  Pt was seen here for same and was sent back to [facility].  Per staff at care home they do not care for pts with catheters and therefore he would need to have it removed this am.  Pt family brought pt back to er for possible admission, in order to keep catheter in place.

## 2015-10-22 NOTE — ED Notes (Signed)
Per [facility] is willing to accept pt back with catheter intact. Pt and son are in agreement with plan.

## 2015-10-23 LAB — URINE CULTURE

## 2015-11-16 ENCOUNTER — Other Ambulatory Visit: Payer: Self-pay | Admitting: Urology

## 2015-11-22 ENCOUNTER — Emergency Department: Payer: Medicare Other

## 2015-11-22 ENCOUNTER — Encounter: Payer: Self-pay | Admitting: Emergency Medicine

## 2015-11-22 ENCOUNTER — Observation Stay
Admission: EM | Admit: 2015-11-22 | Discharge: 2015-11-25 | Payer: Medicare Other | Attending: Internal Medicine | Admitting: Internal Medicine

## 2015-11-22 DIAGNOSIS — Z809 Family history of malignant neoplasm, unspecified: Secondary | ICD-10-CM | POA: Diagnosis not present

## 2015-11-22 DIAGNOSIS — R410 Disorientation, unspecified: Secondary | ICD-10-CM | POA: Insufficient documentation

## 2015-11-22 DIAGNOSIS — J209 Acute bronchitis, unspecified: Principal | ICD-10-CM | POA: Insufficient documentation

## 2015-11-22 DIAGNOSIS — I4891 Unspecified atrial fibrillation: Secondary | ICD-10-CM | POA: Diagnosis not present

## 2015-11-22 DIAGNOSIS — F0390 Unspecified dementia without behavioral disturbance: Secondary | ICD-10-CM | POA: Diagnosis present

## 2015-11-22 DIAGNOSIS — E785 Hyperlipidemia, unspecified: Secondary | ICD-10-CM | POA: Insufficient documentation

## 2015-11-22 DIAGNOSIS — C61 Malignant neoplasm of prostate: Secondary | ICD-10-CM | POA: Diagnosis present

## 2015-11-22 DIAGNOSIS — Z7982 Long term (current) use of aspirin: Secondary | ICD-10-CM | POA: Diagnosis not present

## 2015-11-22 DIAGNOSIS — F0391 Unspecified dementia with behavioral disturbance: Secondary | ICD-10-CM | POA: Diagnosis not present

## 2015-11-22 DIAGNOSIS — Z8249 Family history of ischemic heart disease and other diseases of the circulatory system: Secondary | ICD-10-CM | POA: Insufficient documentation

## 2015-11-22 DIAGNOSIS — N400 Enlarged prostate without lower urinary tract symptoms: Secondary | ICD-10-CM | POA: Insufficient documentation

## 2015-11-22 DIAGNOSIS — Z8546 Personal history of malignant neoplasm of prostate: Secondary | ICD-10-CM | POA: Diagnosis not present

## 2015-11-22 DIAGNOSIS — E039 Hypothyroidism, unspecified: Secondary | ICD-10-CM | POA: Diagnosis present

## 2015-11-22 DIAGNOSIS — Z951 Presence of aortocoronary bypass graft: Secondary | ICD-10-CM | POA: Insufficient documentation

## 2015-11-22 DIAGNOSIS — I959 Hypotension, unspecified: Secondary | ICD-10-CM | POA: Diagnosis not present

## 2015-11-22 DIAGNOSIS — W19XXXA Unspecified fall, initial encounter: Secondary | ICD-10-CM | POA: Diagnosis present

## 2015-11-22 DIAGNOSIS — Z79899 Other long term (current) drug therapy: Secondary | ICD-10-CM | POA: Insufficient documentation

## 2015-11-22 DIAGNOSIS — Z87891 Personal history of nicotine dependence: Secondary | ICD-10-CM | POA: Insufficient documentation

## 2015-11-22 DIAGNOSIS — T83511A Infection and inflammatory reaction due to indwelling urethral catheter, initial encounter: Secondary | ICD-10-CM

## 2015-11-22 DIAGNOSIS — N390 Urinary tract infection, site not specified: Secondary | ICD-10-CM | POA: Diagnosis present

## 2015-11-22 DIAGNOSIS — J40 Bronchitis, not specified as acute or chronic: Secondary | ICD-10-CM | POA: Diagnosis present

## 2015-11-22 HISTORY — DX: Urinary tract infection, site not specified: N39.0

## 2015-11-22 HISTORY — DX: Unspecified dementia, unspecified severity, without behavioral disturbance, psychotic disturbance, mood disturbance, and anxiety: F03.90

## 2015-11-22 HISTORY — DX: Acute bronchitis, unspecified: J20.9

## 2015-11-22 LAB — CBC WITH DIFFERENTIAL/PLATELET
BASOS ABS: 0 10*3/uL (ref 0–0.1)
BASOS PCT: 1 %
EOS ABS: 0.3 10*3/uL (ref 0–0.7)
Eosinophils Relative: 5 %
HCT: 33.3 % — ABNORMAL LOW (ref 40.0–52.0)
Hemoglobin: 11.5 g/dL — ABNORMAL LOW (ref 13.0–18.0)
LYMPHS PCT: 27 %
Lymphs Abs: 1.6 10*3/uL (ref 1.0–3.6)
MCH: 31.3 pg (ref 26.0–34.0)
MCHC: 34.4 g/dL (ref 32.0–36.0)
MCV: 91.1 fL (ref 80.0–100.0)
MONO ABS: 0.6 10*3/uL (ref 0.2–1.0)
Monocytes Relative: 10 %
Neutro Abs: 3.4 10*3/uL (ref 1.4–6.5)
Neutrophils Relative %: 57 %
PLATELETS: 173 10*3/uL (ref 150–440)
RBC: 3.66 MIL/uL — AB (ref 4.40–5.90)
RDW: 13.6 % (ref 11.5–14.5)
WBC: 6 10*3/uL (ref 3.8–10.6)

## 2015-11-22 LAB — URINALYSIS COMPLETE WITH MICROSCOPIC (ARMC ONLY)
BILIRUBIN URINE: NEGATIVE
GLUCOSE, UA: NEGATIVE mg/dL
KETONES UR: NEGATIVE mg/dL
Nitrite: NEGATIVE
PH: 5 (ref 5.0–8.0)
Protein, ur: NEGATIVE mg/dL
Specific Gravity, Urine: 1.012 (ref 1.005–1.030)
Squamous Epithelial / LPF: NONE SEEN

## 2015-11-22 LAB — COMPREHENSIVE METABOLIC PANEL
ALBUMIN: 3.7 g/dL (ref 3.5–5.0)
ALK PHOS: 53 U/L (ref 38–126)
ALT: 13 U/L — ABNORMAL LOW (ref 17–63)
AST: 18 U/L (ref 15–41)
Anion gap: 7 (ref 5–15)
BILIRUBIN TOTAL: 0.6 mg/dL (ref 0.3–1.2)
BUN: 22 mg/dL — AB (ref 6–20)
CALCIUM: 8.9 mg/dL (ref 8.9–10.3)
CO2: 23 mmol/L (ref 22–32)
Chloride: 105 mmol/L (ref 101–111)
Creatinine, Ser: 1.11 mg/dL (ref 0.61–1.24)
GFR calc Af Amer: >60 mL/min (ref >60)
GFR, EST NON AFRICAN AMERICAN: 57 mL/min — AB (ref >60)
GLUCOSE: 100 mg/dL — AB (ref 65–99)
POTASSIUM: 3.9 mmol/L (ref 3.5–5.1)
Sodium: 135 mmol/L (ref 135–145)
TOTAL PROTEIN: 6.6 g/dL (ref 6.5–8.1)

## 2015-11-22 LAB — TROPONIN I

## 2015-11-22 MED ORDER — DEXTROSE 5 % IV SOLN
1.0000 g | Freq: Once | INTRAVENOUS | Status: AC
  Administered 2015-11-22: 1 g via INTRAVENOUS
  Filled 2015-11-22: qty 10

## 2015-11-22 MED ORDER — ONDANSETRON HCL 4 MG PO TABS: 4.0000 mg | ORAL_TABLET | Freq: Four times a day (QID) | ORAL | Status: DC | PRN

## 2015-11-22 MED ORDER — HYDROCODONE-ACETAMINOPHEN 5-325 MG PO TABS
1.0000 | ORAL_TABLET | Freq: Once | ORAL | Status: AC
  Administered 2015-11-22: 1 via ORAL
  Filled 2015-11-22: qty 1

## 2015-11-22 MED ORDER — FINASTERIDE 5 MG PO TABS
5.0000 mg | ORAL_TABLET | Freq: Every day | ORAL | Status: DC
  Administered 2015-11-23 – 2015-11-25 (×3): 5 mg via ORAL
  Filled 2015-11-22 (×3): qty 1

## 2015-11-22 MED ORDER — HYDROCODONE-ACETAMINOPHEN 5-325 MG PO TABS: 1.0000 | ORAL_TABLET | Freq: Four times a day (QID) | ORAL | Status: DC | PRN

## 2015-11-22 MED ORDER — SODIUM CHLORIDE 0.9 % IV BOLUS (SEPSIS)
500.0000 mL | Freq: Once | INTRAVENOUS | Status: AC
  Administered 2015-11-22: 500 mL via INTRAVENOUS

## 2015-11-22 MED ORDER — DILTIAZEM HCL 30 MG PO TABS
60.0000 mg | ORAL_TABLET | Freq: Two times a day (BID) | ORAL | Status: DC
  Administered 2015-11-23 – 2015-11-25 (×4): 60 mg via ORAL
  Filled 2015-11-22 (×5): qty 2

## 2015-11-22 MED ORDER — ACETAMINOPHEN 650 MG RE SUPP: 650.0000 mg | Freq: Four times a day (QID) | RECTAL | Status: DC | PRN

## 2015-11-22 MED ORDER — ENOXAPARIN SODIUM 40 MG/0.4ML ~~LOC~~ SOLN
40.0000 mg | SUBCUTANEOUS | Status: DC
  Administered 2015-11-23 – 2015-11-25 (×3): 40 mg via SUBCUTANEOUS
  Filled 2015-11-22 (×3): qty 0.4

## 2015-11-22 MED ORDER — ACETAMINOPHEN 325 MG PO TABS: 650.0000 mg | ORAL_TABLET | Freq: Four times a day (QID) | ORAL | Status: DC | PRN

## 2015-11-22 MED ORDER — GUAIFENESIN 100 MG/5ML PO SOLN
5.0000 mL | ORAL | Status: DC | PRN
  Administered 2015-11-23: 100 mg via ORAL
  Filled 2015-11-22: qty 10

## 2015-11-22 MED ORDER — DONEPEZIL HCL 5 MG PO TABS
10.0000 mg | ORAL_TABLET | Freq: Every day | ORAL | Status: DC
  Administered 2015-11-23 – 2015-11-25 (×3): 10 mg via ORAL
  Filled 2015-11-22 (×3): qty 2

## 2015-11-22 MED ORDER — TAMSULOSIN HCL 0.4 MG PO CAPS
0.4000 mg | ORAL_CAPSULE | Freq: Every day | ORAL | Status: DC
  Administered 2015-11-23 – 2015-11-24 (×2): 0.4 mg via ORAL
  Filled 2015-11-22 (×2): qty 1

## 2015-11-22 MED ORDER — ASPIRIN EC 325 MG PO TBEC
325.0000 mg | DELAYED_RELEASE_TABLET | Freq: Every day | ORAL | Status: DC
  Administered 2015-11-23 – 2015-11-25 (×3): 325 mg via ORAL
  Filled 2015-11-22 (×3): qty 1

## 2015-11-22 MED ORDER — BICALUTAMIDE 50 MG PO TABS
50.0000 mg | ORAL_TABLET | Freq: Every day | ORAL | Status: DC
  Administered 2015-11-23 – 2015-11-25 (×3): 50 mg via ORAL
  Filled 2015-11-22 (×3): qty 1

## 2015-11-22 MED ORDER — METOPROLOL SUCCINATE ER 25 MG PO TB24
25.0000 mg | ORAL_TABLET | Freq: Every day | ORAL | Status: DC
  Administered 2015-11-23 – 2015-11-25 (×3): 25 mg via ORAL
  Filled 2015-11-22 (×3): qty 1

## 2015-11-22 MED ORDER — BENZONATATE 100 MG PO CAPS
200.0000 mg | ORAL_CAPSULE | Freq: Three times a day (TID) | ORAL | Status: DC
  Administered 2015-11-23 – 2015-11-24 (×5): 200 mg via ORAL
  Filled 2015-11-22 (×7): qty 2

## 2015-11-22 MED ORDER — HYDROXYZINE HCL 25 MG PO TABS
25.0000 mg | ORAL_TABLET | Freq: Four times a day (QID) | ORAL | Status: DC | PRN
  Filled 2015-11-22: qty 1

## 2015-11-22 MED ORDER — AZITHROMYCIN 250 MG PO TABS
250.0000 mg | ORAL_TABLET | Freq: Every day | ORAL | Status: DC
  Administered 2015-11-23 – 2015-11-25 (×3): 250 mg via ORAL
  Filled 2015-11-22 (×3): qty 1

## 2015-11-22 MED ORDER — ONDANSETRON HCL 4 MG/2ML IJ SOLN: 4.0000 mg | Freq: Four times a day (QID) | INTRAMUSCULAR | Status: DC | PRN

## 2015-11-22 MED ORDER — ESCITALOPRAM OXALATE 10 MG PO TABS
5.0000 mg | ORAL_TABLET | Freq: Every day | ORAL | Status: DC
  Administered 2015-11-23 – 2015-11-25 (×3): 5 mg via ORAL
  Filled 2015-11-22 (×3): qty 1

## 2015-11-22 MED ORDER — AZITHROMYCIN 250 MG PO TABS
500.0000 mg | ORAL_TABLET | Freq: Once | ORAL | Status: AC
  Administered 2015-11-23: 500 mg via ORAL
  Filled 2015-11-22: qty 2

## 2015-11-22 MED ORDER — LEVOTHYROXINE SODIUM 50 MCG PO TABS
50.0000 ug | ORAL_TABLET | Freq: Every day | ORAL | Status: DC
  Administered 2015-11-23 – 2015-11-25 (×3): 50 ug via ORAL
  Filled 2015-11-22 (×3): qty 1

## 2015-11-22 NOTE — ED Notes (Signed)
Patient transported to CT 

## 2015-11-22 NOTE — H&P (Addendum)
[place] [facility] [place] NAME: Adam Nielsen    MR#:  [id number]  DATE OF BIRTH:  [birth year]  DATE OF ADMISSION:  11/22/2015  PRIMARY CARE PHYSICIAN: No primary care provider on file.   REQUESTING/REFERRING PHYSICIAN: Marcelene Butte, MD  CHIEF COMPLAINT:   Chief Complaint  Patient presents with  . Fall    HISTORY OF PRESENT ILLNESS:  Adam Nielsen  is a 82 y.o. male who presents with fall at his nursing home. Patient is unable to relate the events of his fall today. His son states that the facility nurse told him that the patient's fall seem to be mechanical. He was trying to sit down in his rolling walker, which has small bench on it, and likely miss a bench and fell straight on the floor. There was no loss of consciousness. However, the patient is also speaking in a somewhat confused manner tonight. He was sent to the ED for evaluation, and was found to have a UTI here on urinalysis. Patient has prostate cancer and has a chronic indwelling Foley, and has had urinary tract infections due to the same in the past. He states he has also had something of a cough recently. He is not bringing up any sputum, though he feels like his cough is "rattly." Hospitalists were called for admission for UTI.  PAST MEDICAL HISTORY:   Past Medical History  Diagnosis Date  . Atrial fibrillation ([place])   . Hypothyroid   . Prostate CA ([place])   . Dementia     PAST SURGICAL HISTORY:   Past Surgical History  Procedure Laterality Date  . Coronary artery bypass graft      SOCIAL HISTORY:   Social History  Substance Use Topics  . Smoking status: Former Research scientist (life sciences)  . Smokeless tobacco: Not on file  . Alcohol Use: No    FAMILY HISTORY:   Family History  Problem Relation Age of Onset  . Cancer Brother   . Heart disease Father     DRUG ALLERGIES:  No Known Allergies  MEDICATIONS AT HOME:   Prior to Admission medications   Medication Sig Start Date End  Date Taking? Authorizing Provider  alendronate (FOSAMAX) 70 MG tablet Take 70 mg by mouth once a week. Pt takes on Friday.   Take with a full glass of water on an empty stomach.   Yes Historical Provider, MD  aspirin EC 325 MG tablet Take 325 mg by mouth daily.   Yes Historical Provider, MD  bicalutamide (CASODEX) 50 MG tablet Take 50 mg by mouth daily.   Yes Historical Provider, MD  clobetasol ointment (TEMOVATE) [id number] % Apply 1 application topically 2 (two) times daily.   Yes Historical Provider, MD  diltiazem (CARDIZEM) 60 MG tablet Take 60 mg by mouth 2 (two) times daily.   Yes Historical Provider, MD  donepezil (ARICEPT) 10 MG tablet Take 10 mg by mouth daily.    Yes Historical Provider, MD  escitalopram (LEXAPRO) 5 MG tablet Take 5 mg by mouth daily.   Yes Historical Provider, MD  finasteride (PROSCAR) 5 MG tablet Take 5 mg by mouth daily.   Yes Historical Provider, MD  hydrOXYzine (ATARAX/VISTARIL) 25 MG tablet Take 25 mg by mouth every 6 (six) hours as needed for itching.   Yes Historical Provider, MD  levothyroxine (SYNTHROID, LEVOTHROID) 25 MCG tablet Take 50 mcg by mouth daily before breakfast.    Yes Historical Provider, MD  metoprolol succinate (TOPROL-XL) 25 MG 24  hr tablet Take 25 mg by mouth daily.   Yes Historical Provider, MD  niacin 500 MG tablet Take 500 mg by mouth daily.    Yes Historical Provider, MD  silodosin (RAPAFLO) 8 MG CAPS capsule Take 8 mg by mouth daily with breakfast.   Yes Historical Provider, MD  zolpidem (AMBIEN) 5 MG tablet Take 5 mg by mouth at bedtime as needed for sleep.   Yes Historical Provider, MD  sulfamethoxazole-trimethoprim (BACTRIM DS) 800-160 MG tablet Take 1 tablet by mouth 2 (two) times daily. Patient not taking: Reported on 11/22/2015 10/21/15   Earleen Newport, MD    REVIEW OF SYSTEMS:  Review of Systems  Constitutional: Negative for fever, chills, weight loss and malaise/fatigue.  HENT: Negative for ear pain, hearing loss and tinnitus.    Eyes: Negative for blurred vision, double vision, pain and redness.  Respiratory: Negative for cough, hemoptysis and shortness of breath.   Cardiovascular: Negative for chest pain, palpitations, orthopnea and leg swelling.  Gastrointestinal: Negative for nausea, vomiting, abdominal pain, diarrhea and constipation.  Genitourinary: Negative for dysuria, frequency and hematuria.  Musculoskeletal: Positive for falls. Negative for back pain, joint pain and neck pain.  Skin:       No acne, rash, or lesions  Neurological: Negative for dizziness, tremors, focal weakness and weakness.  Endo/Heme/Allergies: Negative for polydipsia. Does not bruise/bleed easily.  Psychiatric/Behavioral: Negative for depression. The patient is not nervous/anxious and does not have insomnia.      VITAL SIGNS:   Filed Vitals:   11/22/15 2027 11/22/15 2034 11/22/15 2215  BP: 147/78    Pulse: 92  97  Temp: 97.7 F (36.5 C) 99.4 F (37.4 C)   TempSrc: Oral Rectal   Resp: 14  14  SpO2: 100%  100%   Wt Readings from Last 3 Encounters:  10/22/15 72.576 kg (160 lb)  10/21/15 72.576 kg (160 lb)    PHYSICAL EXAMINATION:  Physical Exam  Vitals reviewed. Constitutional: He appears well-developed and well-nourished. No distress.  HENT:  Head: Normocephalic and atraumatic.  Mouth/Throat: Oropharynx is clear and moist.  Eyes: Conjunctivae and EOM are normal. Pupils are equal, round, and reactive to light. No scleral icterus.  Neck: Normal range of motion. Neck supple. No JVD present. No thyromegaly present.  Cardiovascular: Normal rate, regular rhythm and intact distal pulses.  Exam reveals no gallop and no friction rub.   No murmur heard. Respiratory: Effort normal. No respiratory distress. He has no wheezes. He has no rales.  Coarse congested breath sounds right lower lobe  GI: Soft. Bowel sounds are normal. He exhibits no distension. There is no tenderness.  Musculoskeletal: Normal range of motion. He exhibits  no edema.  No arthritis, no gout  Lymphadenopathy:    He has no cervical adenopathy.  Neurological: He is alert. No cranial nerve deficit.  No dysarthria, no aphasia  Skin: Skin is warm and dry. No rash noted. No erythema.  Psychiatric:  Somewhat confused, though cooperative. Unable to completely assess due to dementia and current medical condition.    LABORATORY PANEL:   CBC  Recent Labs Lab 11/22/15 2034  WBC 6.0  HGB 11.5*  HCT 33.3*  PLT 173   ------------------------------------------------------------------------------------------------------------------  Chemistries   Recent Labs Lab 11/22/15 2034  NA 135  K 3.9  CL 105  CO2 23  GLUCOSE 100*  BUN 22*  CREATININE 1.11  CALCIUM 8.9  AST 18  ALT 13*  ALKPHOS 53  BILITOT 0.6   ------------------------------------------------------------------------------------------------------------------  Cardiac  Enzymes  Recent Labs Lab 11/22/15 2034  TROPONINI <0.03   ------------------------------------------------------------------------------------------------------------------  RADIOLOGY:  Dg Chest 2 View  11/22/2015  CLINICAL DATA:  Cough and chest pain EXAM: CHEST  2 VIEW COMPARISON:  12/23/2014 FINDINGS: Postop CABG. Negative for heart failure. Lungs are clear without infiltrate effusion or mass. Unchanged from the prior study. IMPRESSION: No active cardiopulmonary disease. Electronically Signed   By: Franchot Gallo M.D.   On: 11/22/2015 21:17   Ct Head Wo Contrast  11/22/2015  CLINICAL DATA:  Fall x3, possible UTI EXAM: CT HEAD WITHOUT CONTRAST TECHNIQUE: Contiguous axial images were obtained from the base of the skull through the vertex without intravenous contrast. COMPARISON:  12/23/2014 FINDINGS: No evidence of parenchymal hemorrhage or extra-axial fluid collection. No mass lesion, mass effect, or midline shift. No CT evidence of acute infarction. Extensive subcortical white matter and periventricular small  vessel ischemic changes. Intracranial atherosclerosis. Global cortical and central atrophy.  No ventriculomegaly. Partial opacification of the bilateral ethmoid sinuses. Mucosal thickening with layering fluid in the bilateral maxillary sinuses. Mastoid air cells are clear. Old right lamina papyracea fracture. No evidence of calvarial fracture. IMPRESSION: No evidence of acute intracranial abnormality. Atrophy with small vessel ischemic changes. Electronically Signed   By: Julian Hy M.D.   On: 11/22/2015 21:03    EKG:   Orders placed or performed during the hospital encounter of 11/22/15  . ED EKG  . ED EKG  . EKG 12-Lead  . EKG 12-Lead    IMPRESSION AND PLAN:  Principal Problem:   UTI (lower urinary tract infection) - IV antibiotics started in the ED, continues on admission. Urine culture sent, follow-up for antibiotic tailoring. Active Problems:   Fall - seems to be mechanical in nature, however we will have him on fall precautions here.   Prostate cancer ([place]) - continue daily home dose chemotherapy. Continue chronic indwelling Foley catheter, though exchange for a clean one.   Atrial fibrillation ([place]) - continue home meds   Bronchitis - and azithromycin to his antibiotic regimen, and when necessary antitussives   Hypothyroidism - continue home dose thyroid replacement   Dementia - continue home meds  All the records are reviewed and case discussed with ED provider. Management plans discussed with the patient and/or family.  DVT PROPHYLAXIS: SubQ lovenox  GI PROPHYLAXIS: None  ADMISSION STATUS: Inpatient  CODE STATUS: Full Code Status History    This patient does not have a recorded code status. Please follow your organizational policy for patients in this situation.      TOTAL TIME TAKING CARE OF THIS PATIENT: 45 minutes.    Geri Hepler G. L. Garcia 11/22/2015, 10:41 PM  [facility]  Office  [phone]  CC: Primary care physician; No primary  care provider on file.

## 2015-11-22 NOTE — ED Notes (Signed)
Patient returned to room.  Monitor reapplied.  Family at bedside.

## 2015-11-22 NOTE — ED Notes (Signed)
Pt from [facility] for fall x3 times today. Denies LOC, A&O at baseline. Pt has catheter, has questionable UTI. Pt c/o soreness of left sided chest/rib area.

## 2015-11-22 NOTE — ED Provider Notes (Signed)
Time Seen: Approximately 2020  I have reviewed the triage notes  Chief Complaint: Fall   History of Present Illness: Adam Nielsen is a 82 y.o. male who has a history of atrial fibrillation, coronary artery disease, and prostate cancer. Patient has a chronic indwelling Foley catheter and is scheduled for surgery later this month. Patient apparently has had 3 falls today. No loss of consciousness. He seems to have some mild dementia is unaware of the exact date. He describes some chest discomfort and according to him that occurred after one of his falls. This is a wet sounding cough at the bedside and states he's had this cough now for "" a month "". He was concerns that he may have a urinary tract infection with his Foley catheter. Patient denies any pain outside of his chest discomfort and is awake and alert and follows commands and very cooperative. There is no history of head trauma, loss of consciousness, neck pain, thoracic or lumbar spine pain, etc.   Past Medical History  Diagnosis Date  . Atrial fibrillation ([place])   . Hypothyroid   . Prostate CA ([place])     There are no active problems to display for this patient.   Past Surgical History  Procedure Laterality Date  . Coronary artery bypass graft      Past Surgical History  Procedure Laterality Date  . Coronary artery bypass graft      Current Outpatient Rx  Name  Route  Sig  Dispense  Refill  . aspirin 325 MG tablet   Oral   Take 325 mg by mouth daily.         Marland Kitchen diltiazem (CARDIZEM) 60 MG tablet   Oral   Take 60 mg by mouth 2 (two) times daily.         Marland Kitchen donepezil (ARICEPT) 10 MG tablet   Oral   Take 10 mg by mouth at bedtime.         . finasteride (PROSCAR) 5 MG tablet   Oral   Take 5 mg by mouth daily.         Marland Kitchen levothyroxine (SYNTHROID, LEVOTHROID) 25 MCG tablet   Oral   Take 50 mcg by mouth daily before breakfast.          . metoprolol tartrate (LOPRESSOR) 25 MG tablet   Oral   Take 25  mg by mouth daily.          . niacin 500 MG tablet   Oral   Take 500 mg by mouth at bedtime.         . silodosin (RAPAFLO) 8 MG CAPS capsule   Oral   Take 8 mg by mouth daily with breakfast.         . sulfamethoxazole-trimethoprim (BACTRIM DS) 800-160 MG tablet   Oral   Take 1 tablet by mouth 2 (two) times daily.   20 tablet   0     Allergies:  Review of patient's allergies indicates no known allergies.  Family History: No family history on file.  Social History: Social History  Substance Use Topics  . Smoking status: Former Research scientist (life sciences)  . Smokeless tobacco: None  . Alcohol Use: No     Review of Systems:   10 point review of systems was performed and was otherwise negative:  Constitutional: No fever Eyes: No visual disturbances ENT: No sore throat, ear pain Cardiac: Substernal chest discomfort that is worse with movement and deep inspiration Respiratory: No shortness of breath,  wheezing, or stridor Abdomen: No abdominal pain, no vomiting, No diarrhea Endocrine: No weight loss, No night sweats Extremities: No peripheral edema, cyanosis Skin: No rashes, easy bruising Neurologic: No focal weakness, trouble with speech or swollowing Urologic: Foley catheter in place No dysuria, Hematuria, or urinary frequency   Physical Exam:  ED Triage Vitals  Enc Vitals Group     BP 11/22/15 2027 147/78 mmHg     Pulse Rate 11/22/15 2027 92     Resp 11/22/15 2027 14     Temp 11/22/15 2027 97.7 F (36.5 C)     Temp Source 11/22/15 2027 Oral     SpO2 11/22/15 2027 100 %     Weight --      Height --      Head Cir --      Peak Flow --      Pain Score 11/22/15 2020 5     Pain Loc --      Pain Edu? --      Excl. in [place]? --     General: Awake , Alert , and Oriented times 3; GCS 15 Head: Normal cephalic , atraumatic Eyes: Pupils equal , round, reactive to light Nose/Throat: No nasal drainage, patent upper airway without erythema or exudate.  Neck: Supple, Full range of  motion, No anterior adenopathy or palpable thyroid masses Lungs: Clear to ascultation without wheezes , rhonchi, or rales Heart: Regular rate, regular rhythm without murmurs , gallops , or rubs Abdomen: Soft, non tender without rebound, guarding , or rigidity; bowel sounds positive and symmetric in all 4 quadrants. No organomegaly .        Extremities: 2 plus symmetric pulses. No edema, clubbing or cyanosis Neurologic: normal ambulation, Motor symmetric without deficits, sensory intact Skin: warm, dry, no rashes   Labs:   All laboratory work was reviewed including any pertinent negatives or positives listed below:  Labs Reviewed  [place] ([place])  COMPREHENSIVE METABOLIC PANEL  CBC WITH DIFFERENTIAL/PLATELET  TROPONIN I   reviewed the patient's laboratory work shows a urinary tract infection. Urine culture is pending.  EKG: ED ECG REPORT I, Daymon Larsen, the attending physician, personally viewed and interpreted this ECG.  Date: 11/22/2015 EKG Time: 2025 Rate: 92 Rhythm: Atrial fibrillation QRS Axis: normal Intervals: normal ST/T Wave abnormalities: normal Conduction Disturbances: none Narrative Interpretation: unremarkable No acute ischemic changes noted, poor R-wave progression noticed in the anterior leads.   Radiology:     Narrative:   CLINICAL DATA: Cough and chest pain  EXAM: CHEST 2 VIEW  COMPARISON: 12/23/2014  FINDINGS: Postop CABG. Negative for heart failure. Lungs are clear without infiltrate effusion or mass. Unchanged from the prior study.  IMPRESSION: No active cardiopulmonary disease.   Electronically Signed By: Franchot Gallo M.D. On: 11/22/2015 21:17          CT Head Wo Contrast (Final result) Result time: 11/22/15 21:03:33   Final result by Rad Results In Interface (11/22/15 21:03:33)   Narrative:   CLINICAL DATA: Fall x3, possible UTI  EXAM: CT HEAD WITHOUT  CONTRAST  TECHNIQUE: Contiguous axial images were obtained from the base of the skull through the vertex without intravenous contrast.  COMPARISON: 12/23/2014  FINDINGS: No evidence of parenchymal hemorrhage or extra-axial fluid collection. No mass lesion, mass effect, or midline shift.  No CT evidence of acute infarction.  Extensive subcortical white matter and periventricular small vessel ischemic changes. Intracranial atherosclerosis.  Global cortical and central atrophy. No ventriculomegaly.  Partial opacification  of the bilateral ethmoid sinuses. Mucosal thickening with layering fluid in the bilateral maxillary sinuses. Mastoid air cells are clear.  Old right lamina papyracea fracture. No evidence of calvarial fracture.  IMPRESSION: No evidence of acute intracranial abnormality.  Atrophy with small vessel ischemic changes.   Electronically Signed By: Julian Hy M.D.      I personally reviewed the radiologic studies    ED Course:  Patient's stay here was uneventful he does not appear to have suffered any significant injury from his falls and there is no current focal neurologic deficits or indications on his head CT of a cerebral vascular accident. Does have risk factor of having chronic atrial fibrillation which is not being anticoagulated at this time. Patient appears to have a urinary tract infection with his Foley that appears to be draining in a non-complicated fashion. Patient was started on IV antibiotics and consultation was established with the hospitalist team.   Assessment: * Recurrent falls Urinary tract infection Chronic indwelling catheter     Plan:  Inpatient management          Daymon Larsen, MD 11/22/15 2347

## 2015-11-23 DIAGNOSIS — N390 Urinary tract infection, site not specified: Secondary | ICD-10-CM | POA: Diagnosis present

## 2015-11-23 LAB — MRSA PCR SCREENING: MRSA by PCR: NEGATIVE

## 2015-11-23 LAB — BASIC METABOLIC PANEL
Anion gap: 9 (ref 5–15)
BUN: 17 mg/dL (ref 6–20)
CHLORIDE: 108 mmol/L (ref 101–111)
CO2: 21 mmol/L — ABNORMAL LOW (ref 22–32)
Calcium: 8.8 mg/dL — ABNORMAL LOW (ref 8.9–10.3)
Creatinine, Ser: 0.99 mg/dL (ref 0.61–1.24)
GFR calc Af Amer: >60 mL/min (ref >60)
GFR calc non Af Amer: >60 mL/min (ref >60)
GLUCOSE: 98 mg/dL (ref 65–99)
POTASSIUM: 3.9 mmol/L (ref 3.5–5.1)
SODIUM: 138 mmol/L (ref 135–145)

## 2015-11-23 LAB — CBC
HEMATOCRIT: 33.9 % — AB (ref 40.0–52.0)
HEMATOCRIT: 32.9 % — AB (ref 40.0–52.0)
HEMOGLOBIN: 11.5 g/dL — AB (ref 13.0–18.0)
Hemoglobin: 12 g/dL — ABNORMAL LOW (ref 13.0–18.0)
MCH: 32.4 pg (ref 26.0–34.0)
MCH: 31.6 pg (ref 26.0–34.0)
MCHC: 35.5 g/dL (ref 32.0–36.0)
MCHC: 35 g/dL (ref 32.0–36.0)
MCV: 91.3 fL (ref 80.0–100.0)
MCV: 90.4 fL (ref 80.0–100.0)
PLATELETS: 167 10*3/uL (ref 150–440)
Platelets: 176 10*3/uL (ref 150–440)
RBC: 3.71 MIL/uL — ABNORMAL LOW (ref 4.40–5.90)
RBC: 3.64 MIL/uL — ABNORMAL LOW (ref 4.40–5.90)
RDW: 13.5 % (ref 11.5–14.5)
RDW: 13.6 % (ref 11.5–14.5)
WBC: 7.4 10*3/uL (ref 3.8–10.6)
WBC: 6.9 10*3/uL (ref 3.8–10.6)

## 2015-11-23 LAB — INFLUENZA PANEL BY PCR (TYPE A & B)
H1N1 flu by pcr: NOT DETECTED
INFLBPCR: NEGATIVE
Influenza A By PCR: NEGATIVE

## 2015-11-23 LAB — CREATININE, SERUM
Creatinine, Ser: 1.06 mg/dL (ref 0.61–1.24)
GFR calc Af Amer: >60 mL/min (ref >60)
GFR calc non Af Amer: >60 mL/min (ref >60)

## 2015-11-23 MED ORDER — QUETIAPINE FUMARATE 25 MG PO TABS
25.0000 mg | ORAL_TABLET | Freq: Every day | ORAL | Status: DC
  Administered 2015-11-23 – 2015-11-24 (×2): 25 mg via ORAL
  Filled 2015-11-23 (×2): qty 1

## 2015-11-23 MED ORDER — DEXTROSE 5 % IV SOLN
2.0000 g | INTRAVENOUS | Status: DC
  Filled 2015-11-23: qty 2

## 2015-11-23 MED ORDER — HALOPERIDOL 0.5 MG PO TABS
0.5000 mg | ORAL_TABLET | Freq: Four times a day (QID) | ORAL | Status: DC | PRN
  Administered 2015-11-23: 0.5 mg via ORAL
  Filled 2015-11-23: qty 1

## 2015-11-23 MED ORDER — BUDESONIDE 0.25 MG/2ML IN SUSP
0.2500 mg | Freq: Two times a day (BID) | RESPIRATORY_TRACT | Status: DC
  Administered 2015-11-23 – 2015-11-25 (×4): 0.25 mg via RESPIRATORY_TRACT
  Filled 2015-11-23 (×4): qty 2

## 2015-11-23 MED ORDER — DEXTROSE 5 % IV SOLN
1.0000 g | INTRAVENOUS | Status: DC
  Administered 2015-11-23 – 2015-11-25 (×3): 1 g via INTRAVENOUS
  Filled 2015-11-23 (×3): qty 10

## 2015-11-23 MED ORDER — PREDNISONE 20 MG PO TABS
20.0000 mg | ORAL_TABLET | Freq: Every day | ORAL | Status: DC
  Administered 2015-11-23: 20 mg via ORAL
  Filled 2015-11-23: qty 1

## 2015-11-23 MED ORDER — IPRATROPIUM-ALBUTEROL 0.5-2.5 (3) MG/3ML IN SOLN
3.0000 mL | Freq: Four times a day (QID) | RESPIRATORY_TRACT | Status: DC
  Administered 2015-11-23 – 2015-11-25 (×8): 3 mL via RESPIRATORY_TRACT
  Filled 2015-11-23 (×8): qty 3

## 2015-11-23 NOTE — Progress Notes (Signed)
Pharmacy Antibiotic Note  Adam Nielsen is a 82 y.o. male admitted on 11/22/2015 with UTI.  Pharmacy has been consulted for ceftriaxone dosing.   Plan: Patient is not septic, will change ceftriaxone to 1 gram q 24 hours.  Height: 5\' 7"  (170.2 cm) Weight: 158 lb 12.8 oz (72.031 kg) IBW/kg (Calculated) : 66.1  Temp (24hrs), Avg:98.3 F (36.8 C), Min:97.7 F (36.5 C), Max:99.4 F (37.4 C)   Recent Labs Lab 11/22/15 2034 11/23/15 0102 11/23/15 0535  WBC 6.0 7.4 6.9  CREATININE 1.11 1.06 0.99    Estimated Creatinine Clearance: 48.2 mL/min (by C-G formula based on Cr of 0.99).    No Known Allergies  Antimicrobials this admission:  ceftriaxone 3/7 >>    Azithromycin 3/7>>   Dose adjustments this admission:   Microbiology results:  3/7 UCx: pending  3/7  MRSA PCR: neg  UA: LE(+) NO2(-) WBC TNTC CXR: no active disease   Thank you for allowing pharmacy to be a part of this patient's care.  Ramond Dial 11/23/2015 9:12 AM

## 2015-11-23 NOTE — Clinical Social Work Note (Signed)
Clinical Social Work Assessment  Patient Details  Name: Adam Nielsen MRN: [id number] Date of Birth: 1925/07/26  Date of referral:  11/23/15               Reason for consult:  Facility Placement                Permission sought to share information with:  Family Supports Permission granted to share information::  Yes, Verbal Permission Granted  Name::     Adam Nielsen, son  Housing/Transportation Living arrangements for the past 2 months:  [place] of Information:  Adult Children Patient Interpreter Needed:  None Criminal Activity/Legal Involvement Pertinent to Current Situation/Hospitalization:  No - Comment as needed Significant Relationships:  Adult Children Lives with:  Facility Resident Do you feel safe going back to the place where you live?  Yes Need for family participation in patient care:  Yes (Comment)  Care giving concerns:  No caregiving concerns identified.   Social Worker assessment / plan:  CSW spokew with pt's son to address consult. CSW introduced herself and explained role of social work. CSW also explained the process of returning to ALF. Pt was admitted from [facility]. PT is recommending HHPT. Pt's on would like for pt to return to facility and he will provide the transportation at discharge. Pt's son does not want him traveling via EMS due to the cost.   CSW spoke with [facility] and pt is able to return at discharge with [facility] also confirmed that pt is open to [place]. RNCM is following for HHPT needs. CSW will continue to follow.   Employment status:  Retired Nurse, adult PT Recommendations:  Home with [place] / Referral to community resources:  Other (Comment Required) ([facility])  Patient/Family's Response to care:  Pt's son was appreciative of CSW support.   Patient/Family's Understanding of and Emotional Response to Diagnosis, Current Treatment, and Prognosis:   Pt's son understands that pt is to return to facility at discharge.   Emotional Assessment Appearance:  Appears stated age Attitude/Demeanor/Rapport:  Other Affect (typically observed):  Other Orientation:  Oriented to Self Alcohol / Substance use:  Never Used Psych involvement (Current and /or in the community):  No (Comment)  Discharge Needs  Concerns to be addressed:  Adjustment to Illness Readmission within the last 30 days:  No Current discharge risk:  Chronically ill Barriers to Discharge:  Continued Medical Work up   [employer], LCSW 11/23/2015, 4:19 PM

## 2015-11-23 NOTE — Progress Notes (Signed)
Son questioning nurse as to why pt is confused stating this is not him , nurse called Dr Leslye Peer and ask him to speak with pt son , Dr Leslye Peer then talked to son

## 2015-11-23 NOTE — Progress Notes (Signed)
Patient ID: Adam Nielsen, male   DOB: [birth year], 82 y.o.   MRN: [id number] [facility] PROGRESS NOTE  Adam Nielsen [id number] DOB: [birth year] DOA: 11/22/2015 PCP: No primary care provider on file.  HPI/Subjective: Patient coughing constantly in room.  Patient has dementia, so he is not the best historian. The patient stated that he had a fall from the tree outside. I spoke with his son and he does have dementia. He does have a Foley catheter and they plan on doing a urological procedure at the end of the month.  Objective: Filed Vitals:   11/23/15 0437 11/23/15 0812  BP: 115/62 120/96  Pulse: 108 98  Temp: 98.1 F (36.7 C) 98 F (36.7 C)  Resp: 19 21    Filed Weights   11/22/15 2340  Weight: 72.031 kg (158 lb 12.8 oz)    ROS: Review of Systems  Unable to perform ROS Respiratory: Positive for cough and shortness of breath.   Cardiovascular: Positive for chest pain.  Gastrointestinal: Negative for nausea, vomiting and abdominal pain.  Musculoskeletal: Negative for joint pain.   review of systems is limited and unreliable with dementia  Exam: Physical Exam  HENT:  Nose: No mucosal edema.  Mouth/Throat: No oropharyngeal exudate or posterior oropharyngeal edema.  Eyes: Conjunctivae, EOM and lids are normal. Pupils are equal, round, and reactive to light.  Neck: No JVD present. Carotid bruit is not present. No edema present. No thyroid mass and no thyromegaly present.  Cardiovascular: S1 normal and S2 normal.  Exam reveals no gallop.   No murmur heard. Pulses:      Dorsalis pedis pulses are 2+ on the right side, and 2+ on the left side.  Respiratory: No respiratory distress. He has decreased breath sounds in the right upper field, the right middle field, the right lower field, the left upper field, the left middle field and the left lower field. He has wheezes in the right upper field, the right middle field, the right lower field, the left upper field, the  left middle field and the left lower field. He has no rhonchi. He has no rales.  GI: Soft. Bowel sounds are normal. There is no tenderness.  Musculoskeletal:       Right ankle: He exhibits swelling.       Left ankle: He exhibits swelling.  Lymphadenopathy:    He has no cervical adenopathy.  Neurological: He is alert.  Skin: Skin is warm. No rash noted. Nails show no clubbing.  Psychiatric: He has a normal mood and affect.      Data Reviewed: Basic Metabolic Panel:  Recent Labs Lab 11/22/15 2034 11/23/15 0102 11/23/15 0535  NA 135  --  138  K 3.9  --  3.9  CL 105  --  108  CO2 23  --  21*  GLUCOSE 100*  --  98  BUN 22*  --  17  CREATININE 1.11 1.06 0.99  CALCIUM 8.9  --  8.8*   Liver Function Tests:  Recent Labs Lab 11/22/15 2034  AST 18  ALT 13*  ALKPHOS 53  BILITOT 0.6  PROT 6.6  ALBUMIN 3.7   CBC:  Recent Labs Lab 11/22/15 2034 11/23/15 0102 11/23/15 0535  WBC 6.0 7.4 6.9  NEUTROABS 3.4  --   --   HGB 11.5* 12.0* 11.5*  HCT 33.3* 33.9* 32.9*  MCV 91.1 91.3 90.4  PLT 173 167 176   Cardiac Enzymes:  Recent Labs Lab 11/22/15 2034  TROPONINI <  0.03    Recent Results (from the past 240 hour(s))  MRSA PCR Screening     Status: None   Collection Time: 11/22/15 11:56 PM  Result Value Ref Range Status   MRSA by PCR NEGATIVE NEGATIVE Final    Comment:        The GeneXpert MRSA Assay (FDA approved for NASAL specimens only), is one component of a comprehensive MRSA colonization surveillance program. It is not intended to diagnose MRSA infection nor to guide or monitor treatment for MRSA infections.      Studies: Dg Chest 2 View  11/22/2015  CLINICAL DATA:  Cough and chest pain EXAM: CHEST  2 VIEW COMPARISON:  12/23/2014 FINDINGS: Postop CABG. Negative for heart failure. Lungs are clear without infiltrate effusion or mass. Unchanged from the prior study. IMPRESSION: No active cardiopulmonary disease. Electronically Signed   By: Franchot Gallo  M.D.   On: 11/22/2015 21:17   Ct Head Wo Contrast  11/22/2015  CLINICAL DATA:  Fall x3, possible UTI EXAM: CT HEAD WITHOUT CONTRAST TECHNIQUE: Contiguous axial images were obtained from the base of the skull through the vertex without intravenous contrast. COMPARISON:  12/23/2014 FINDINGS: No evidence of parenchymal hemorrhage or extra-axial fluid collection. No mass lesion, mass effect, or midline shift. No CT evidence of acute infarction. Extensive subcortical white matter and periventricular small vessel ischemic changes. Intracranial atherosclerosis. Global cortical and central atrophy.  No ventriculomegaly. Partial opacification of the bilateral ethmoid sinuses. Mucosal thickening with layering fluid in the bilateral maxillary sinuses. Mastoid air cells are clear. Old right lamina papyracea fracture. No evidence of calvarial fracture. IMPRESSION: No evidence of acute intracranial abnormality. Atrophy with small vessel ischemic changes. Electronically Signed   By: Julian Hy M.D.   On: 11/22/2015 21:03    Scheduled Meds: . aspirin EC  325 mg Oral Daily  . azithromycin  250 mg Oral Daily  . benzonatate  200 mg Oral TID  . bicalutamide  50 mg Oral Daily  . budesonide (PULMICORT) nebulizer solution  0.25 mg Nebulization BID  . cefTRIAXone (ROCEPHIN)  IV  2 g Intravenous Q24H  . diltiazem  60 mg Oral BID  . donepezil  10 mg Oral Daily  . enoxaparin (LOVENOX) injection  40 mg Subcutaneous Q24H  . escitalopram  5 mg Oral Daily  . finasteride  5 mg Oral Daily  . ipratropium-albuterol  3 mL Nebulization Q6H  . levothyroxine  50 mcg Oral QAC breakfast  . metoprolol succinate  25 mg Oral Daily  . tamsulosin  0.4 mg Oral QPC breakfast    Assessment/Plan:  1. Asthmatic bronchitis. Since the flu has been ready prevalent in [place] I will get a flu swab. Continue antibiotics Rocephin and Zithromax. Add nebulizers with budesonide and low-dose prednisone. I'm hesitant with prednisone with  somebody with dementia. Patient needs better air entry prior to getting out of the hospital 2. Dementia without behavioral disturbance continue Aricept 3. BPH and history of prostate cancer with Foley catheter. Foley catheter was changed yesterday. I would not treat catheter urinary tract infection with septic. Side don't care with the urine culture shows. Continue BPH medications 4. History of atrial fibrillation rate controlled on diltiazem and metoprolol 5. Hypothyroidism unspecified continue levothyroxine  Code Status:     Code Status Orders        Start     Ordered   11/22/15 2341  Full code   Continuous     11/22/15 2340    Code Status History  Date Active Date Inactive Code Status Order ID Comments User Context   This patient has a current code status but no historical code status.     Family Communication: Spoke with son on phone Disposition Plan: Get physical therapy consultation to see if he is a candidate to go back to [facility] house  Antibiotics:  Rocephin  Zithromax  Time spent: 30 minutes  Loletha Grayer  [facility]

## 2015-11-23 NOTE — Progress Notes (Signed)
Pt impulsive trying to get out of bed calling for wife , nurse called son [name] who said wife died 4 yrs ago, and that he was on way to visit pt

## 2015-11-23 NOTE — Evaluation (Signed)
Physical Therapy Evaluation Patient Details Name: Adam Nielsen MRN: [id number] DOB: 1924-12-10 Today's Date: 11/23/2015   History of Present Illness  Pt is a 82 y.o. male with PMH of A-Fib, hypothyroid, dementia and postate cancer.  Pt presented with a fall and confusion from his assisted living center.  Pt was admitted for UTI.      Clinical Impression  Prior to admission pt reported was independent with RW; however, pt reported that he had one fall.  Pt lives in assisted living facility.  Pt was CGA for bed mobility, sit to stand and ambulation with RW.  Pt ambulated 100 feet with RW.  Pt required VC's and tactile cues for walker placement.  Due to aforementioned function and strength deficits, pt is in need of skilled physical therapy.  It is recommended that pt is discharged back to his assisted living facility with home health PT when medically appropriate.     Follow Up Recommendations Home health PT    Equipment Recommendations       Recommendations for Other Services       Precautions / Restrictions Precautions Precautions: Fall Restrictions Weight Bearing Restrictions: No      Mobility  Bed Mobility Overal bed mobility: Needs Assistance Bed Mobility: Rolling;Sidelying to Sit Rolling: Min guard Sidelying to sit: Min guard          Transfers Overall transfer level: Needs assistance Equipment used: Rolling walker (2 wheeled) Transfers: Sit to/from Stand Sit to Stand: Min guard            Ambulation/Gait Ambulation/Gait assistance: Min guard Ambulation Distance (Feet): 100 Feet Assistive device: Rolling walker (2 wheeled) Gait Pattern/deviations: Decreased step length - left;Decreased step length - right;Trunk flexed Gait velocity: decreased    Required VC's and tactile cues for walker placement.  Stairs            Wheelchair Mobility    Modified Rankin (Stroke Patients Only)       Balance Overall balance assessment: Needs  assistance Sitting-balance support: Feet supported Sitting balance-Leahy Scale: Good     Standing balance support: Bilateral upper extremity supported (RW) Standing balance-Leahy Scale: Good                               Pertinent Vitals/Pain Pain Assessment: No/denies pain  See flow sheet for vitals.     Home Living Family/patient expects to be discharged to:: Assisted living               Home Equipment: Walker - 2 wheels      Prior Function Level of Independence: Independent with assistive device(s) (Pt reports he is independent with functional mobility (RW))         Comments: Utilizes RW for ambulation.     Hand Dominance        Extremity/Trunk Assessment   Upper Extremity Assessment: Overall WFL for tasks assessed           Lower Extremity Assessment: Generalized weakness      Cervical / Trunk Assessment: Normal  Communication   Communication: No difficulties  Cognition Arousal/Alertness: Awake/alert Behavior During Therapy: WFL for tasks assessed/performed Overall Cognitive Status: Within Functional Limits for tasks assessed                      General Comments   Nursing was contacted and cleared pt for physical therapy.  Pt was agreeable and tolerated session well.  A tornado drill was performed at the end of session and PT remained with pt in hallway during drill.     Exercises        Assessment/Plan    PT Assessment Patient needs continued PT services  PT Diagnosis Generalized weakness   PT Problem List Decreased strength;Decreased activity tolerance;Decreased balance;Decreased mobility;Decreased knowledge of use of DME  PT Treatment Interventions DME instruction;Gait training;Functional mobility training;Therapeutic activities;Therapeutic exercise;Balance training;Patient/family education   PT Goals (Current goals can be found in the Care Plan section) Acute Rehab PT Goals Patient Stated Goal: to go back to  his assisted living facility PT Goal Formulation: With patient Time For Goal Achievement: 12/07/15 Potential to Achieve Goals: Good    Frequency Min 2X/week   Barriers to discharge        Co-evaluation               End of Session Equipment Utilized During Treatment: Gait belt Activity Tolerance: Patient tolerated treatment well Patient left: in chair;with call bell/phone within reach;with chair alarm set Nurse Communication: Mobility status         Time: [id number] PT Time Calculation (min) (ACUTE ONLY): 22 min   Charges:         PT G Codes:       [name], SPT [name] 11/23/2015, 12:48 PM

## 2015-11-23 NOTE — NC FL2 (Signed)
[place] LEVEL OF CARE SCREENING TOOL     IDENTIFICATION  Patient Name: Adam Nielsen Birthdate: October 21, 1924 Sex: male Admission Date (Current Location): 11/22/2015  [facility] and [place] Number:  Engineering geologist and Address:  [facility], [address] [place], [place] [address]      Provider Number:    Attending Physician Name and Address:  Loletha Grayer, MD  Relative Name and Phone Number:       Current Level of Care: Hospital Recommended Level of Care: [place] Prior Approval Number:    Date Approved/Denied:   PASRR Number:    Discharge Plan: Domiciliary (Rest home)    Current Diagnoses: Patient Active Problem List   Diagnosis Date Noted  . UTI (urinary tract infection) 11/23/2015  . UTI (lower urinary tract infection) 11/22/2015  . Prostate cancer ([place]) 11/22/2015  . Hypothyroidism 11/22/2015  . Atrial fibrillation ([place]) 11/22/2015  . Dementia 11/22/2015  . Fall 11/22/2015  . Bronchitis 11/22/2015    Orientation RESPIRATION BLADDER Height & Weight     Self  Normal Continent Weight: 158 lb 12.8 oz (72.031 kg) Height:  5\' 7"  (170.2 cm)  BEHAVIORAL SYMPTOMS/MOOD NEUROLOGICAL BOWEL NUTRITION STATUS      Continent Diet (Heart Healthy Diet, Thin Liquids)  AMBULATORY STATUS COMMUNICATION OF NEEDS Skin   Supervision Verbally Normal                       Personal Care Assistance Level of Assistance  Bathing, Feeding, Dressing Bathing Assistance: Independent Feeding assistance: Independent Dressing Assistance: Independent     Functional Limitations Info  Sight, Hearing, Speech Sight Info: Adequate Hearing Info: Adequate Speech Info: Adequate    SPECIAL CARE FACTORS FREQUENCY  PT (By licensed PT)     PT Frequency: 3x/week              Contractures      Additional Factors Info  Code Status, Allergies, Psychotropic Code Status Info: Full Code Allergies Info: No known  allergies Psychotropic Info: Medications         Current Medications (11/23/2015):  This is the current hospital active medication list Current Facility-Administered Medications  Medication Dose Route Frequency Provider Last Rate Last Dose  . acetaminophen (TYLENOL) tablet 650 mg  650 mg Oral Q6H PRN Lance Coon, MD       Or  . acetaminophen (TYLENOL) suppository 650 mg  650 mg Rectal Q6H PRN Lance Coon, MD      . aspirin EC tablet 325 mg  325 mg Oral Daily Lance Coon, MD   325 mg at 11/23/15 [id number]  . azithromycin [facility]) tablet 250 mg  250 mg Oral Daily Lance Coon, MD   250 mg at 11/23/15 0905  . benzonatate (TESSALON) capsule 200 mg  200 mg Oral TID Lance Coon, MD   200 mg at 11/23/15 1535  . bicalutamide (CASODEX) tablet 50 mg  50 mg Oral Daily Lance Coon, MD   50 mg at 11/23/15 0906  . budesonide (PULMICORT) nebulizer solution 0.25 mg  0.25 mg Nebulization BID Loletha Grayer, MD   0.25 mg at 11/23/15 0915  . cefTRIAXone (ROCEPHIN) 1 g in dextrose 5 % 50 mL IVPB  1 g Intravenous Q24H Loletha Grayer, MD   1 g at 11/23/15 1130  . diltiazem (CARDIZEM) tablet 60 mg  60 mg Oral BID Lance Coon, MD   60 mg at 11/23/15 0904  . donepezil (ARICEPT) tablet 10 mg  10 mg Oral Daily Lance Coon, MD   10 mg at 11/23/15 0905  . enoxaparin (LOVENOX) injection 40 mg  40 mg Subcutaneous Q24H Lance Coon, MD   40 mg at 11/23/15 0022  . escitalopram (LEXAPRO) tablet 5 mg  5 mg Oral Daily Lance Coon, MD   5 mg at 11/23/15 0905  . finasteride (PROSCAR) tablet 5 mg  5 mg Oral Daily Lance Coon, MD   5 mg at 11/23/15 0905  . guaiFENesin (ROBITUSSIN) 100 MG/5ML solution 100 mg  5 mL Oral Q4H PRN Lance Coon, MD   100 mg at 11/23/15 [id number]  . haloperidol (HALDOL) tablet 0.5 mg  0.5 mg Oral Q6H PRN Loletha Grayer, MD      . HYDROcodone-acetaminophen (NORCO/VICODIN) 5-325 MG per tablet 1 tablet  1 tablet Oral Q6H PRN Lance Coon, MD      . hydrOXYzine (ATARAX/VISTARIL) tablet 25 mg  25 mg Oral  Q6H PRN Lance Coon, MD      . ipratropium-albuterol (DUONEB) 0.5-2.5 (3) MG/3ML nebulizer solution 3 mL  3 mL Nebulization Q6H Loletha Grayer, MD   3 mL at 11/23/15 1438  . levothyroxine (SYNTHROID, LEVOTHROID) tablet 50 mcg  50 mcg Oral QAC breakfast Lance Coon, MD   50 mcg at 11/23/15 0905  . metoprolol succinate (TOPROL-XL) 24 hr tablet 25 mg  25 mg Oral Daily Lance Coon, MD   25 mg at 11/23/15 0905  . ondansetron (ZOFRAN) tablet 4 mg  4 mg Oral Q6H PRN Lance Coon, MD       Or  . ondansetron [facility]) injection 4 mg  4 mg Intravenous Q6H PRN Lance Coon, MD      . QUEtiapine (SEROQUEL) tablet 25 mg  25 mg Oral QHS Loletha Grayer, MD      . tamsulosin [facility]) capsule 0.4 mg  0.4 mg Oral QPC breakfast Lance Coon, MD   0.4 mg at 11/23/15 [id number]     Discharge Medications: Please see discharge summary for a list of discharge medications.  Relevant Imaging Results:  Relevant Lab Results:   Additional Information SSN:  [id number]   Pt will have HHPT through Abner Greenspan, LCSW

## 2015-11-23 NOTE — Progress Notes (Signed)
Pharmacy Antibiotic Note  Adam Nielsen is a 82 y.o. male admitted on 11/22/2015 with UTI.  Pharmacy has been consulted for ceftriaxone dosing.  Plan: Ceftriaxone 2 grams q 24 hours ordered.  Height: 5\' 7"  (170.2 cm) Weight: 158 lb 12.8 oz (72.031 kg) IBW/kg (Calculated) : 66.1  Temp (24hrs), Avg:98.4 F (36.9 C), Min:97.7 F (36.5 C), Max:99.4 F (37.4 C)   Recent Labs Lab 11/22/15 2034  WBC 6.0  CREATININE 1.11    Estimated Creatinine Clearance: 43 mL/min (by C-G formula based on Cr of 1.11).    No Known Allergies  Antimicrobials this admission:   >>    >>   Dose adjustments this admission:   Microbiology results:  3/7 UCx: pending  3/7  MRSA PCR: pending  UA: LE(+) NO2(-) WBC TNTC CXR: no active disease   Thank you for allowing pharmacy to be a part of this patient's care.  Aileana Hodder S 11/23/2015 12:29 AM

## 2015-11-23 NOTE — Care Management Obs Status (Signed)
[facility] NOTIFICATION   Patient Details  Name: Adam Nielsen MRN: [id number] Date of Birth: [birth year]   Medicare Observation Status Notification Given:  Yes Code [address] RN 11/23/2015, 3:53 PM

## 2015-11-23 NOTE — Care Management (Addendum)
Spoke with patient's son- he agrees to home health services at [facility]. Referral called to [place]. Confirmed that patient is open to [place] for nursing- please add PT and F2F.

## 2015-11-24 MED ORDER — AZITHROMYCIN 250 MG PO TABS: ORAL_TABLET | ORAL | Status: DC

## 2015-11-24 MED ORDER — QUETIAPINE FUMARATE 25 MG PO TABS: ORAL_TABLET | ORAL | Status: DC

## 2015-11-24 MED ORDER — SODIUM CHLORIDE 0.9 % IV BOLUS (SEPSIS)
500.0000 mL | Freq: Once | INTRAVENOUS | Status: AC
  Administered 2015-11-24: 500 mL via INTRAVENOUS

## 2015-11-24 MED ORDER — BENZONATATE 100 MG PO CAPS
200.0000 mg | ORAL_CAPSULE | Freq: Three times a day (TID) | ORAL | Status: DC
  Administered 2015-11-24: 200 mg via ORAL
  Filled 2015-11-24 (×2): qty 2

## 2015-11-24 MED ORDER — BENZONATATE 200 MG PO CAPS: 200.0000 mg | ORAL_CAPSULE | Freq: Three times a day (TID) | ORAL | Status: DC

## 2015-11-24 MED ORDER — ALBUTEROL SULFATE HFA 108 (90 BASE) MCG/ACT IN AERS: 2.0000 | INHALATION_SPRAY | Freq: Four times a day (QID) | RESPIRATORY_TRACT | Status: AC | PRN

## 2015-11-24 MED ORDER — SODIUM CHLORIDE 0.9 % IV SOLN
INTRAVENOUS | Status: DC
  Administered 2015-11-24: 16:00:00 via INTRAVENOUS

## 2015-11-24 MED ORDER — BISACODYL 10 MG RE SUPP
10.0000 mg | Freq: Once | RECTAL | Status: AC
  Administered 2015-11-24: 10 mg via RECTAL
  Filled 2015-11-24: qty 1

## 2015-11-24 NOTE — Progress Notes (Signed)
Patient is medically stable for D/C back to [facility] today. Clinical Education officer, museum (CSW) contacted Occupational hygienist at [facility] who reported that patient can return today. CSW faxed D/C Summary, FL2 and prescriptions to [place]. Patient is aware of above. Patient's son Adam Nielsen is at bedside and aware of above. Patient's son Adam Nielsen will transport back to [facility]. Please reconsult if future social work needs arise. CSW signing off.   Blima Rich, LCSW [phone]

## 2015-11-24 NOTE — Progress Notes (Signed)
Patient ID: Adam Nielsen, male   DOB: 1924/12/11, 82 y.o.   MRN: [id number]  BP came up after fluid bolus Son wanted tessolon pearles added to instructions  Patient still has cough Patient still needs meds to control Heart rate  Lungs- still with good air entry, scattered rhonchi  Dr Leslye Peer

## 2015-11-24 NOTE — Discharge Summary (Addendum)
[facility] NAME: Adam Nielsen    MR#:  [id number]  DATE OF BIRTH:  05-05-1925  DATE OF ADMISSION:  11/22/2015 ADMITTING PHYSICIAN: Lance Coon, MD  DATE OF DISCHARGE: 11/25/2015  PRIMARY CARE PHYSICIAN: No primary care provider on file.    ADMISSION DIAGNOSIS:  Acute bronchitis  DISCHARGE DIAGNOSIS:  Acute bronchitis  SECONDARY DIAGNOSIS:   Past Medical History  Diagnosis Date  . Atrial fibrillation ([place])   . Hypothyroid   . Prostate CA ([place])   . Dementia     HOSPITAL COURSE:   1. Acute bronchitis. Chest x-ray was negative. Flu swab was negative. Patient was given Rocephin and Zithromax initially and be continued on Zithromax as outpatient. I did prescribe nebulizer treatments while here and lungs are now clear after the nebulizer treatments. 2. Dementia with behavioral disturbance. Patient had an acute delirium because he did not sleep the night before. I gave him a dose of Seroquel last night and he slept well and his mental status is improved today. 3. Chronic Foley catheter. I do not treat catheter associated infections unless the patient is septic. The patient does not meet septic criteria I do not care what the culture result shows. No need to treat this. Change Foley catheter every 3 weeks. 4. Atrial fibrillation continue usual medications and aspirin for anticoagulation 5. Hypothyroidism unspecified continue levothyroxine 6. History of prostate cancer and BPH with chronic Foley catheter. Follow-up with urology as outpatient 7. Hyperlipidemia unspecified on niacin 8. Hypotension yesterday- improved with fluid bolus. I held the patients flomax (he takes rapaflo as outpatient which is fine to go back on)  DISCHARGE CONDITIONS:   Satisfactory  CONSULTS OBTAINED:  None  DRUG ALLERGIES:  No Known Allergies  DISCHARGE MEDICATIONS:   Current Discharge Medication List    START taking these medications    Details  albuterol (PROVENTIL HFA;VENTOLIN HFA) 108 (90 Base) MCG/ACT inhaler Inhale 2 puffs into the lungs every 6 (six) hours as needed for wheezing or shortness of breath. Qty: 1 Inhaler, Refills: 0    azithromycin (ZITHROMAX) 250 MG tablet Take one tab daily for 3 more days Qty: 6 each, Refills: 0    benzonatate (TESSALON) 200 MG capsule Take 1 capsule (200 mg total) by mouth 3 (three) times daily. Qty: 20 capsule, Refills: 0    QUEtiapine (SEROQUEL) 25 MG tablet 0.5 tabs po nightly for insomnia Qty: 30 tablet, Refills: 0      CONTINUE these medications which have NOT CHANGED   Details  alendronate (FOSAMAX) 70 MG tablet Take 70 mg by mouth once a week. Pt takes on Friday.   Take with a full glass of water on an empty stomach.    aspirin EC 325 MG tablet Take 325 mg by mouth daily.    bicalutamide (CASODEX) 50 MG tablet Take 50 mg by mouth daily.    clobetasol ointment (TEMOVATE) AB-123456789 % Apply 1 application topically 2 (two) times daily.    diltiazem (CARDIZEM) 60 MG tablet Take 60 mg by mouth 2 (two) times daily.    donepezil (ARICEPT) 10 MG tablet Take 10 mg by mouth daily.     escitalopram (LEXAPRO) 5 MG tablet Take 5 mg by mouth daily.    finasteride (PROSCAR) 5 MG tablet Take 5 mg by mouth daily.    hydrOXYzine (ATARAX/VISTARIL) 25 MG tablet Take 25 mg by mouth every 6 (six) hours as needed for itching.    levothyroxine (SYNTHROID, LEVOTHROID)  25 MCG tablet Take 50 mcg by mouth daily before breakfast.     metoprolol succinate (TOPROL-XL) 25 MG 24 hr tablet Take 25 mg by mouth daily.    niacin 500 MG tablet Take 500 mg by mouth daily.     silodosin (RAPAFLO) 8 MG CAPS capsule Take 8 mg by mouth daily with breakfast.      STOP taking these medications     zolpidem (AMBIEN) 5 MG tablet      sulfamethoxazole-trimethoprim (BACTRIM DS) 800-160 MG tablet          DISCHARGE INSTRUCTIONS:   Follow-up with your medical doctor one week  If you experience  worsening of your admission symptoms, develop shortness of breath, life threatening emergency, suicidal or homicidal thoughts you must seek medical attention immediately by calling 911 or calling your MD immediately  if symptoms less severe.  You Must read complete instructions/literature along with all the possible adverse reactions/side effects for all the Medicines you take and that have been prescribed to you. Take any new Medicines after you have completely understood and accept all the possible adverse reactions/side effects.   Please note  You were cared for by a hospitalist during your hospital stay. If you have any questions about your discharge medications or the care you received while you were in the hospital after you are discharged, you can call the unit and asked to speak with the hospitalist on call if the hospitalist that took care of you is not available. Once you are discharged, your primary care physician will handle any further medical issues. Please note that NO REFILLS for any discharge medications will be authorized once you are discharged, as it is imperative that you return to your primary care physician (or establish a relationship with a primary care physician if you do not have one) for your aftercare needs so that they can reassess your need for medications and monitor your lab values.    Today   CHIEF COMPLAINT:   Chief Complaint  Patient presents with  . Fall    HISTORY OF PRESENT ILLNESS:  Adam Nielsen  is a 82 y.o. male with a known history of dementia presented after a fall. Found to have an acute bronchitis   VITAL SIGNS:  Blood pressure 144/92, pulse 78, temperature 98.2 F (36.8 C), temperature source Oral, resp. rate 20, height 5\' 7"  (1.702 m), weight 72.031 kg (158 lb 12.8 oz), SpO2 92 %.    PHYSICAL EXAMINATION:  GENERAL:  82 y.o.-year-old patient lying in the bed with no acute distress.  EYES: Pupils equal, round, reactive to light and  accommodation. No scleral icterus. HEENT: Head atraumatic, normocephalic. Oropharynx and nasopharynx clear.  NECK:  Supple, no jugular venous distention. No thyroid enlargement, no tenderness.  LUNGS: Normal breath sounds bilaterally, no wheezing, rales,rhonchi or crepitation. No use of accessory muscles of respiration.  CARDIOVASCULAR: S1, S2 normal. No murmurs, rubs, or gallops.  ABDOMEN: Soft, non-tender, non-distended. Bowel sounds present. No organomegaly or mass.  EXTREMITIES: Trace edema, no cyanosis, or clubbing.  NEUROLOGIC: Cranial nerves II through XII are intact. Muscle strength 5/5 in all extremities. PSYCHIATRIC: The patient is alert.  SKIN: No obvious rash, lesion, or ulcer.   DATA REVIEW:   CBC  Recent Labs Lab 11/23/15 0535  WBC 6.9  HGB 11.5*  HCT 32.9*  PLT 176    Chemistries   Recent Labs Lab 11/22/15 2034  11/23/15 0535  NA 135  --  138  K 3.9  --  3.9  CL 105  --  108  CO2 23  --  21*  GLUCOSE 100*  --  98  BUN 22*  --  17  CREATININE 1.11  < > 0.99  CALCIUM 8.9  --  8.8*  AST 18  --   --   ALT 13*  --   --   ALKPHOS 53  --   --   BILITOT 0.6  --   --   < > = values in this interval not displayed.  Cardiac Enzymes  Recent Labs Lab 11/22/15 2034  TROPONINI <0.03    Microbiology Results  Results for orders placed or performed during the hospital encounter of 11/22/15  Urine culture     Status: None   Collection Time: 11/22/15  8:33 PM  Result Value Ref Range Status   Specimen Description URINE, CATHETERIZED  Final   Special Requests Normal  Final   Culture MULTIPLE SPECIES PRESENT, SUGGEST RECOLLECTION  Final   Report Status 11/25/2015 FINAL  Final  MRSA PCR Screening     Status: None   Collection Time: 11/22/15 11:56 PM  Result Value Ref Range Status   MRSA by PCR NEGATIVE NEGATIVE Final    Comment:        The GeneXpert MRSA Assay (FDA approved for NASAL specimens only), is one component of a comprehensive MRSA  colonization surveillance program. It is not intended to diagnose MRSA infection nor to guide or monitor treatment for MRSA infections.     RADIOLOGY:  No results found.  Management plans discussed with the patient, family and they are in agreement.  CODE STATUS:     Code Status Orders        Start     Ordered   11/22/15 2341  Full code   Continuous     11/22/15 2340    Code Status History    Date Active Date Inactive Code Status Order ID Comments User Context   This patient has a current code status but no historical code status.      TOTAL TIME TAKING CARE OF THIS PATIENT: 35 minutes.    Loletha Grayer M.D on 11/25/2015 at 10:04 AM  Between 7am to 6pm - Pager - [phone]  After 6pm go to [email or web address] - password [email or web address] [facility]  Office  [phone]  CC: Primary care physician; No primary care provider on file.

## 2015-11-24 NOTE — Progress Notes (Signed)
Pt with constipation . Passing flatus. Spoke with dr Leslye Peer in person.. md ordered  Dulcolax supp once 10gr

## 2015-11-24 NOTE — NC FL2 (Signed)
[place] LEVEL OF CARE SCREENING TOOL     IDENTIFICATION  Patient Name: Adam Nielsen Birthdate: March 02, 1925 Sex: male Admission Date (Current Location): 11/22/2015  [facility] and [place] Number:  Engineering geologist and Address:  [facility], [address], [place], [place] [address]      Provider Number:    Attending Physician Name and Address:  Loletha Grayer, MD  Relative Name and Phone Number:       Current Level of Care: Hospital Recommended Level of Care: [place] Prior Approval Number:    Date Approved/Denied:   PASRR Number:    Discharge Plan: Domiciliary (Rest home)    Current Diagnoses: Patient Active Problem List   Diagnosis Date Noted  . UTI (urinary tract infection) 11/23/2015  . UTI (lower urinary tract infection) 11/22/2015  . Prostate cancer ([place]) 11/22/2015  . Hypothyroidism 11/22/2015  . Atrial fibrillation ([place]) 11/22/2015  . Dementia 11/22/2015  . Fall 11/22/2015  . Bronchitis 11/22/2015    Orientation RESPIRATION BLADDER Height & Weight     Self  Normal Continent Weight: 158 lb 12.8 oz (72.031 kg) Height:  5\' 7"  (170.2 cm)  BEHAVIORAL SYMPTOMS/MOOD NEUROLOGICAL BOWEL NUTRITION STATUS      Continent Diet: Heart Healthy   AMBULATORY STATUS COMMUNICATION OF NEEDS Skin   Supervision Verbally Normal                       Personal Care Assistance Level of Assistance  Bathing, Feeding, Dressing Bathing Assistance: Independent Feeding assistance: Independent Dressing Assistance: Independent     Functional Limitations Info  Sight, Hearing, Speech Sight Info: Adequate Hearing Info: Adequate Speech Info: Adequate    SPECIAL CARE FACTORS FREQUENCY  PT (By licensed PT)     PT Frequency: 3x/week              Contractures      Additional Factors Info  Code Status, Allergies, Psychotropic Code Status Info: Full Code Allergies Info: No known allergies Psychotropic  Info: Medications        Discharge Medications: Please see discharge summary for a list of discharge medications. DISCHARGE MEDICATIONS:   Current Discharge Medication List    START taking these medications   Details  azithromycin (ZITHROMAX) 250 MG tablet Take one tab daily for 3 more days Qty: 6 each, Refills: 0    QUEtiapine (SEROQUEL) 25 MG tablet 0.5 tabs po nightly for insomnia Qty: 30 tablet, Refills: 0      CONTINUE these medications which have NOT CHANGED   Details  alendronate (FOSAMAX) 70 MG tablet Take 70 mg by mouth once a week. Pt takes on Friday.  Take with a full glass of water on an empty stomach.    aspirin EC 325 MG tablet Take 325 mg by mouth daily.    bicalutamide (CASODEX) 50 MG tablet Take 50 mg by mouth daily.    clobetasol ointment (TEMOVATE) AB-123456789 % Apply 1 application topically 2 (two) times daily.    diltiazem (CARDIZEM) 60 MG tablet Take 60 mg by mouth 2 (two) times daily.    donepezil (ARICEPT) 10 MG tablet Take 10 mg by mouth daily.     escitalopram (LEXAPRO) 5 MG tablet Take 5 mg by mouth daily.    finasteride (PROSCAR) 5 MG tablet Take 5 mg by mouth daily.    hydrOXYzine (ATARAX/VISTARIL) 25 MG tablet Take 25 mg by mouth every 6 (six) hours as needed for itching.  levothyroxine (SYNTHROID, LEVOTHROID) 25 MCG tablet Take 50 mcg by mouth daily before breakfast.     metoprolol succinate (TOPROL-XL) 25 MG 24 hr tablet Take 25 mg by mouth daily.    niacin 500 MG tablet Take 500 mg by mouth daily.     silodosin (RAPAFLO) 8 MG CAPS capsule Take 8 mg by mouth daily with breakfast.           Relevant Imaging Results:  Relevant Lab Results:   Additional Information SSN:  [id number]  Loralyn Freshwater, LCSW

## 2015-11-24 NOTE — Discharge Instructions (Signed)
Foley catheter need to be changed every three weeks

## 2015-11-24 NOTE — Progress Notes (Signed)
Patient ID: Adam Nielsen, male   DOB: 10-Jun-1925, 82 y.o.   MRN: [id number] [facility] PROGRESS NOTE  Adam Nielsen [id number] DOB: 1925/07/08 DOA: 11/22/2015 PCP: No primary care provider on file.  HPI/Subjective: Patient seen earlier in again and this afternoon. Patient was answering all questions appropriately and following commands better than yesterday. They called me a second time that his blood pressure dropped down.  Objective: Filed Vitals:   11/24/15 1352 11/24/15 1428  BP: 104/61 98/60  Pulse: 103 106  Temp: 98.8 F (37.1 C) 98.2 F (36.8 C)  Resp: 18 18    Filed Weights   11/22/15 2340  Weight: 72.031 kg (158 lb 12.8 oz)    ROS: Review of Systems  Unable to perform ROS Respiratory: Positive for cough and shortness of breath.   Cardiovascular: Negative for chest pain.  Gastrointestinal: Negative for nausea, vomiting and abdominal pain.  Musculoskeletal: Negative for joint pain.  Neurological: Positive for weakness.   review of systems is limited and unreliable with dementia  Exam: Physical Exam  HENT:  Nose: No mucosal edema.  Mouth/Throat: No oropharyngeal exudate or posterior oropharyngeal edema.  Eyes: Conjunctivae, EOM and lids are normal. Pupils are equal, round, and reactive to light.  Neck: No JVD present. Carotid bruit is not present. No edema present. No thyroid mass and no thyromegaly present.  Cardiovascular: S1 normal and S2 normal.  Exam reveals no gallop.   No murmur heard. Pulses:      Dorsalis pedis pulses are 2+ on the right side, and 2+ on the left side.  Respiratory: No respiratory distress. He has no decreased breath sounds. He has no wheezes. He has rhonchi in the right lower field and the left lower field. He has no rales.  GI: Soft. Bowel sounds are normal. There is no tenderness.  Musculoskeletal:       Right ankle: He exhibits swelling.       Left ankle: He exhibits swelling.  Lymphadenopathy:    He has no  cervical adenopathy.  Neurological: He is alert.  Skin: Skin is warm. No rash noted. Nails show no clubbing.  Psychiatric: He has a normal mood and affect.      Data Reviewed: Basic Metabolic Panel:  Recent Labs Lab 11/22/15 2034 11/23/15 0102 11/23/15 0535  NA 135  --  138  K 3.9  --  3.9  CL 105  --  108  CO2 23  --  21*  GLUCOSE 100*  --  98  BUN 22*  --  17  CREATININE 1.11 1.06 0.99  CALCIUM 8.9  --  8.8*   Liver Function Tests:  Recent Labs Lab 11/22/15 2034  AST 18  ALT 13*  ALKPHOS 53  BILITOT 0.6  PROT 6.6  ALBUMIN 3.7   CBC:  Recent Labs Lab 11/22/15 2034 11/23/15 0102 11/23/15 0535  WBC 6.0 7.4 6.9  NEUTROABS 3.4  --   --   HGB 11.5* 12.0* 11.5*  HCT 33.3* 33.9* 32.9*  MCV 91.1 91.3 90.4  PLT 173 167 176   Cardiac Enzymes:  Recent Labs Lab 11/22/15 2034  TROPONINI <0.03    Recent Results (from the past 240 hour(s))  Urine culture     Status: None (Preliminary result)   Collection Time: 11/22/15  8:33 PM  Result Value Ref Range Status   Specimen Description URINE, CATHETERIZED  Final   Special Requests Normal  Final   Culture HOLDING FOR POSSIBLE PATHOGEN  Final  Report Status PENDING  Incomplete  MRSA PCR Screening     Status: None   Collection Time: 11/22/15 11:56 PM  Result Value Ref Range Status   MRSA by PCR NEGATIVE NEGATIVE Final    Comment:        The GeneXpert MRSA Assay (FDA approved for NASAL specimens only), is one component of a comprehensive MRSA colonization surveillance program. It is not intended to diagnose MRSA infection nor to guide or monitor treatment for MRSA infections.      Studies: Dg Chest 2 View  11/22/2015  CLINICAL DATA:  Cough and chest pain EXAM: CHEST  2 VIEW COMPARISON:  12/23/2014 FINDINGS: Postop CABG. Negative for heart failure. Lungs are clear without infiltrate effusion or mass. Unchanged from the prior study. IMPRESSION: No active cardiopulmonary disease. Electronically Signed   By:  Franchot Gallo M.D.   On: 11/22/2015 21:17   Ct Head Wo Contrast  11/22/2015  CLINICAL DATA:  Fall x3, possible UTI EXAM: CT HEAD WITHOUT CONTRAST TECHNIQUE: Contiguous axial images were obtained from the base of the skull through the vertex without intravenous contrast. COMPARISON:  12/23/2014 FINDINGS: No evidence of parenchymal hemorrhage or extra-axial fluid collection. No mass lesion, mass effect, or midline shift. No CT evidence of acute infarction. Extensive subcortical white matter and periventricular small vessel ischemic changes. Intracranial atherosclerosis. Global cortical and central atrophy.  No ventriculomegaly. Partial opacification of the bilateral ethmoid sinuses. Mucosal thickening with layering fluid in the bilateral maxillary sinuses. Mastoid air cells are clear. Old right lamina papyracea fracture. No evidence of calvarial fracture. IMPRESSION: No evidence of acute intracranial abnormality. Atrophy with small vessel ischemic changes. Electronically Signed   By: Julian Hy M.D.   On: 11/22/2015 21:03    Scheduled Meds: . aspirin EC  325 mg Oral Daily  . azithromycin  250 mg Oral Daily  . benzonatate  200 mg Oral TID  . bicalutamide  50 mg Oral Daily  . budesonide (PULMICORT) nebulizer solution  0.25 mg Nebulization BID  . cefTRIAXone (ROCEPHIN)  IV  1 g Intravenous Q24H  . diltiazem  60 mg Oral BID  . donepezil  10 mg Oral Daily  . enoxaparin (LOVENOX) injection  40 mg Subcutaneous Q24H  . escitalopram  5 mg Oral Daily  . finasteride  5 mg Oral Daily  . ipratropium-albuterol  3 mL Nebulization Q6H  . levothyroxine  50 mcg Oral QAC breakfast  . metoprolol succinate  25 mg Oral Daily  . QUEtiapine  25 mg Oral QHS  . tamsulosin  0.4 mg Oral QPC breakfast    Assessment/Plan:  1. Hypotension unspecified with 2 episodes. I will give a fluid bolus and continuous fluids and watch again overnight. Check orthostatic vital signs.  2. Asthmatic bronchitis. Better breath  sounds today. Continue antibiotics Rocephin and Zithromax. Continue nebulizer treatments  3. Dementia without behavioral disturbance continue Aricept. Seroquel at night  4. BPH and history of prostate cancer with Foley catheter. Foley catheter was changed yesterday. I would not treat catheter urinary tract infection with septic. Side don't care with the urine culture shows. Continue BPH medications 5. History of atrial fibrillation. Rate slightly elevated.   continue diltiazem and metoprolol 6. Hypothyroidism unspecified continue levothyroxine  Code Status:     Code Status Orders        Start     Ordered   11/22/15 2341  Full code   Continuous     11/22/15 2340    Code Status History  Date Active Date Inactive Code Status Order ID Comments User Context   This patient has a current code status but no historical code status.     Family Communication: Spoke with son on phoneEarlier this morning and at bedside now  Disposition Plan: [facility]  Antibiotics:  Rocephin  Zithromax  Time spent: 40  minutes  Loletha Grayer  [facility] [place] [facility]

## 2015-11-24 NOTE — Care Management (Signed)
I have notified [facility] of patient discharge today.

## 2015-11-24 NOTE — Care Management (Signed)
I have notified [facility] that patient discharge has been cancelled.

## 2015-11-24 NOTE — Progress Notes (Signed)
bp 82/50 for discharge. rn spoke with dr wieting . md ordered ns bolus 500cc over 1 hr. Pt switched to leg bag per family request. Apparently pt uses leg bag at facility

## 2015-11-24 NOTE — Progress Notes (Signed)
Called Dr about patient BP and if to give the Cardizem. Was advised to hold the Cardizem for now, will continue to monitor.

## 2015-11-24 NOTE — Progress Notes (Signed)
Patient's D/C was cancelled due to BP. Clinical Education officer, museum (CSW) notified [employer]. CSW will continue to follow and assist as needed.   Blima Rich, LCSW [phone]

## 2015-11-25 LAB — URINE CULTURE: SPECIAL REQUESTS: NORMAL

## 2015-11-25 NOTE — NC FL2 (Signed)
[place] LEVEL OF CARE SCREENING TOOL     IDENTIFICATION  Patient Name: Adam Nielsen Birthdate: 21-Jul-1925 Sex: male Admission Date (Current Location): 11/22/2015  [facility] and [place] Number:  Engineering geologist and Address:  [facility], [address] [place], [place] [address]      Provider Number:    Attending Physician Name and Address:  Loletha Grayer, MD  Relative Name and Phone Number:       Current Level of Care: Hospital Recommended Level of Care: [place] Prior Approval Number:    Date Approved/Denied:   PASRR Number:    Discharge Plan: Domiciliary (Rest home)    Current Diagnoses: Patient Active Problem List   Diagnosis Date Noted  . UTI (urinary tract infection) 11/23/2015  . UTI (lower urinary tract infection) 11/22/2015  . Prostate cancer ([place]) 11/22/2015  . Hypothyroidism 11/22/2015  . Atrial fibrillation ([place]) 11/22/2015  . Dementia 11/22/2015  . Fall 11/22/2015  . Bronchitis 11/22/2015    Orientation RESPIRATION BLADDER Height & Weight     Self  Normal Continent Weight: 158 lb 12.8 oz (72.031 kg) Height:  5\' 7"  (170.2 cm)  BEHAVIORAL SYMPTOMS/MOOD NEUROLOGICAL BOWEL NUTRITION STATUS      Continent Diet: Heart Healthy   AMBULATORY STATUS COMMUNICATION OF NEEDS Skin   Supervision Verbally Normal                       Personal Care Assistance Level of Assistance  Bathing, Feeding, Dressing Bathing Assistance: Independent Feeding assistance: Independent Dressing Assistance: Independent     Functional Limitations Info  Sight, Hearing, Speech Sight Info: Adequate Hearing Info: Adequate Speech Info: Adequate    SPECIAL CARE FACTORS FREQUENCY  PT (By licensed PT)     PT Frequency: 3x/week              Contractures      Additional Factors Info  Code Status, Allergies, Psychotropic Code Status Info: Full Code Allergies Info: No known allergies Psychotropic  Info: Medications        Discharge Medications: Please see discharge summary for a list of discharge medications. DISCHARGE MEDICATIONS:   Current Discharge Medication List    START taking these medications   Details  albuterol (PROVENTIL HFA;VENTOLIN HFA) 108 (90 Base) MCG/ACT inhaler Inhale 2 puffs into the lungs every 6 (six) hours as needed for wheezing or shortness of breath. Qty: 1 Inhaler, Refills: 0    azithromycin (ZITHROMAX) 250 MG tablet Take one tab daily for 3 more days Qty: 6 each, Refills: 0    benzonatate (TESSALON) 200 MG capsule Take 1 capsule (200 mg total) by mouth 3 (three) times daily. Qty: 20 capsule, Refills: 0    QUEtiapine (SEROQUEL) 25 MG tablet 0.5 tabs po nightly for insomnia Qty: 30 tablet, Refills: 0      CONTINUE these medications which have NOT CHANGED   Details  alendronate (FOSAMAX) 70 MG tablet Take 70 mg by mouth once a week. Pt takes on Friday.  Take with a full glass of water on an empty stomach.    aspirin EC 325 MG tablet Take 325 mg by mouth daily.    bicalutamide (CASODEX) 50 MG tablet Take 50 mg by mouth daily.    clobetasol ointment (TEMOVATE) AB-123456789 % Apply 1 application topically 2 (two) times daily.    diltiazem (CARDIZEM) 60 MG tablet Take 60 mg by mouth 2 (two) times daily.    donepezil (ARICEPT) 10  MG tablet Take 10 mg by mouth daily.     escitalopram (LEXAPRO) 5 MG tablet Take 5 mg by mouth daily.    finasteride (PROSCAR) 5 MG tablet Take 5 mg by mouth daily.    hydrOXYzine (ATARAX/VISTARIL) 25 MG tablet Take 25 mg by mouth every 6 (six) hours as needed for itching.    levothyroxine (SYNTHROID, LEVOTHROID) 25 MCG tablet Take 50 mcg by mouth daily before breakfast.     metoprolol succinate (TOPROL-XL) 25 MG 24 hr tablet Take 25 mg by mouth daily.    niacin 500 MG tablet Take 500 mg by mouth daily.     silodosin (RAPAFLO) 8 MG CAPS capsule Take 8 mg by  mouth daily with breakfast.           Relevant Imaging Results:  Relevant Lab Results:   Additional Information SSN:  [id number]  Loralyn Freshwater, LCSW

## 2015-11-25 NOTE — Care Management (Signed)
I have notified Sonia Side with [facility] of patient discharge today. Case closed.

## 2015-11-25 NOTE — Progress Notes (Signed)
Patient ID: Adam Nielsen, male   DOB: Sep 27, 1924, 82 y.o.   MRN: [id number]  Left message for son.  BP better this am Discharge today. I edited discharge summer for todays date and updated todays physical exam  Dr Leslye Peer

## 2015-11-25 NOTE — Plan of Care (Signed)
Problem: Education: Goal: Knowledge of [facility] Education information/materials will improve Outcome: Not Progressing Patient has dementia

## 2015-11-25 NOTE — Progress Notes (Signed)
Patient is medically stable for D/C to [facility] today. Per Med Tech at [facility] patient can return today. Clinical Education officer, museum (CSW) faxed D/C Summary, FL2 and prescriptions to [employer]. Patient's son Adam Nielsen will provide transport today. RN aware of above. Please reconsult if future social work needs arise. CSW signing off.   Adam Rich, LCSW [phone]

## 2015-11-29 ENCOUNTER — Encounter (HOSPITAL_COMMUNITY): Payer: Self-pay | Admitting: *Deleted

## 2015-12-01 ENCOUNTER — Other Ambulatory Visit (HOSPITAL_COMMUNITY): Payer: Medicare Other

## 2015-12-05 ENCOUNTER — Encounter (HOSPITAL_COMMUNITY): Payer: Self-pay | Admitting: *Deleted

## 2015-12-06 ENCOUNTER — Other Ambulatory Visit: Payer: Self-pay | Admitting: Urology

## 2015-12-06 ENCOUNTER — Encounter (HOSPITAL_COMMUNITY): Payer: Self-pay | Admitting: *Deleted

## 2015-12-08 NOTE — Progress Notes (Signed)
Spoke with personnel at [facility] and they have received preop instructions that were faxed earlier today.

## 2015-12-08 NOTE — Progress Notes (Signed)
Spoke with son Sincer Collie.  Son will bring him to hospital from [facility] and take him back to [place].  He is aware to have him here at 0700 on 12/12/2015 and surgery will be from 1000-1100am.  Son states patient gets around with a walker but he will use a wheelchair once in hospital to bring him to 3rd Floor.  He is aware to check in at Admitting with Insurance Card and picture ID. Son is also aware Duncan will receive a fax regarding the preop instructions.

## 2015-12-09 NOTE — Progress Notes (Signed)
Faxed preop instructions on 12/08/2015 x 2 to [facility].   Refaxed again on 12/09/2015 to [place] at [facility] stated she received preop instructions. Asked Hinton Dyer regarding patient medications for verification on Zolpidem since she handles patient medications she stated and she stated Zolpidem was 2.5 mg by mouth as needed.  Corrected dosage in Medications in EPIC.

## 2015-12-12 ENCOUNTER — Ambulatory Visit (HOSPITAL_COMMUNITY): Payer: Medicare Other | Admitting: Anesthesiology

## 2015-12-12 ENCOUNTER — Encounter (HOSPITAL_COMMUNITY)
Admission: RE | Disposition: A | Discharge status: Skilled Nursing Facility | Payer: Self-pay | Source: Ambulatory Visit | Attending: Urology

## 2015-12-12 ENCOUNTER — Ambulatory Visit (HOSPITAL_COMMUNITY)
Admission: RE | Admit: 2015-12-12 | Discharge: 2015-12-13 | Disposition: A | Discharge status: Skilled Nursing Facility | Payer: Medicare Other | Source: Ambulatory Visit | Attending: Urology | Admitting: Urology

## 2015-12-12 ENCOUNTER — Encounter (HOSPITAL_COMMUNITY): Payer: Self-pay | Admitting: Anesthesiology

## 2015-12-12 DIAGNOSIS — C61 Malignant neoplasm of prostate: Secondary | ICD-10-CM | POA: Diagnosis not present

## 2015-12-12 DIAGNOSIS — M1990 Unspecified osteoarthritis, unspecified site: Secondary | ICD-10-CM | POA: Diagnosis not present

## 2015-12-12 DIAGNOSIS — Z951 Presence of aortocoronary bypass graft: Secondary | ICD-10-CM | POA: Diagnosis not present

## 2015-12-12 DIAGNOSIS — I4891 Unspecified atrial fibrillation: Secondary | ICD-10-CM | POA: Diagnosis not present

## 2015-12-12 DIAGNOSIS — M810 Age-related osteoporosis without current pathological fracture: Secondary | ICD-10-CM | POA: Insufficient documentation

## 2015-12-12 DIAGNOSIS — J449 Chronic obstructive pulmonary disease, unspecified: Secondary | ICD-10-CM | POA: Insufficient documentation

## 2015-12-12 DIAGNOSIS — Z791 Long term (current) use of non-steroidal anti-inflammatories (NSAID): Secondary | ICD-10-CM | POA: Diagnosis not present

## 2015-12-12 DIAGNOSIS — Z7983 Long term (current) use of bisphosphonates: Secondary | ICD-10-CM | POA: Diagnosis not present

## 2015-12-12 DIAGNOSIS — F0390 Unspecified dementia without behavioral disturbance: Secondary | ICD-10-CM | POA: Diagnosis not present

## 2015-12-12 DIAGNOSIS — R338 Other retention of urine: Secondary | ICD-10-CM | POA: Diagnosis not present

## 2015-12-12 DIAGNOSIS — N401 Enlarged prostate with lower urinary tract symptoms: Secondary | ICD-10-CM | POA: Diagnosis present

## 2015-12-12 DIAGNOSIS — Z8546 Personal history of malignant neoplasm of prostate: Secondary | ICD-10-CM | POA: Diagnosis not present

## 2015-12-12 DIAGNOSIS — Z7902 Long term (current) use of antithrombotics/antiplatelets: Secondary | ICD-10-CM | POA: Diagnosis not present

## 2015-12-12 DIAGNOSIS — Z87891 Personal history of nicotine dependence: Secondary | ICD-10-CM | POA: Diagnosis not present

## 2015-12-12 DIAGNOSIS — C7800 Secondary malignant neoplasm of unspecified lung: Secondary | ICD-10-CM | POA: Insufficient documentation

## 2015-12-12 DIAGNOSIS — Z79899 Other long term (current) drug therapy: Secondary | ICD-10-CM | POA: Diagnosis not present

## 2015-12-12 DIAGNOSIS — N210 Calculus in bladder: Secondary | ICD-10-CM | POA: Insufficient documentation

## 2015-12-12 DIAGNOSIS — R339 Retention of urine, unspecified: Secondary | ICD-10-CM | POA: Diagnosis present

## 2015-12-12 HISTORY — DX: Presence of other specified devices: Z97.8

## 2015-12-12 HISTORY — DX: Urinary tract infection, site not specified: N39.0

## 2015-12-12 HISTORY — DX: Presence of urogenital implants: Z96.0

## 2015-12-12 HISTORY — DX: Hyperlipidemia, unspecified: E78.5

## 2015-12-12 HISTORY — DX: Acute bronchitis, unspecified: J20.9

## 2015-12-12 HISTORY — PX: TRANSURETHRAL RESECTION OF PROSTATE: SHX73

## 2015-12-12 HISTORY — DX: Benign prostatic hyperplasia without lower urinary tract symptoms: N40.0

## 2015-12-12 HISTORY — DX: Unspecified fall, initial encounter: W19.XXXA

## 2015-12-12 SURGERY — TRANSURETHRAL RESECTION OF THE PROSTATE WITH GYRUS INSTRUMENTS
Anesthesia: General

## 2015-12-12 MED ORDER — ACETAMINOPHEN 160 MG/5ML PO SOLN: 325.0000 mg | ORAL | Status: DC | PRN

## 2015-12-12 MED ORDER — MEPERIDINE HCL 50 MG/ML IJ SOLN: 6.2500 mg | INTRAMUSCULAR | Status: DC | PRN

## 2015-12-12 MED ORDER — PROMETHAZINE HCL 25 MG/ML IJ SOLN: 6.2500 mg | INTRAMUSCULAR | Status: DC | PRN

## 2015-12-12 MED ORDER — LACTATED RINGERS IV SOLN
INTRAVENOUS | Status: DC | PRN
  Administered 2015-12-12: 09:00:00 via INTRAVENOUS

## 2015-12-12 MED ORDER — CIPROFLOXACIN IN D5W 400 MG/200ML IV SOLN
INTRAVENOUS | Status: AC
  Filled 2015-12-12: qty 200

## 2015-12-12 MED ORDER — ACETAMINOPHEN 325 MG PO TABS: 325.0000 mg | ORAL_TABLET | ORAL | Status: DC | PRN

## 2015-12-12 MED ORDER — FENTANYL CITRATE (PF) 100 MCG/2ML IJ SOLN
INTRAMUSCULAR | Status: AC
  Filled 2015-12-12: qty 2

## 2015-12-12 MED ORDER — BELLADONNA ALKALOIDS-OPIUM 16.2-60 MG RE SUPP: 1.0000 | Freq: Four times a day (QID) | RECTAL | Status: DC | PRN

## 2015-12-12 MED ORDER — BACITRACIN-NEOMYCIN-POLYMYXIN 400-5-5000 EX OINT: 1.0000 "application " | TOPICAL_OINTMENT | Freq: Three times a day (TID) | CUTANEOUS | Status: DC | PRN

## 2015-12-12 MED ORDER — ONDANSETRON HCL 4 MG/2ML IJ SOLN: 4.0000 mg | INTRAMUSCULAR | Status: DC | PRN

## 2015-12-12 MED ORDER — SODIUM CHLORIDE 0.9 % IJ SOLN
INTRAMUSCULAR | Status: AC
  Filled 2015-12-12: qty 10

## 2015-12-12 MED ORDER — DEXAMETHASONE SODIUM PHOSPHATE 10 MG/ML IJ SOLN
INTRAMUSCULAR | Status: DC | PRN
  Administered 2015-12-12: 10 mg via INTRAVENOUS

## 2015-12-12 MED ORDER — LIDOCAINE HCL (CARDIAC) 20 MG/ML IV SOLN
INTRAVENOUS | Status: DC | PRN
  Administered 2015-12-12: 75 mg via INTRAVENOUS

## 2015-12-12 MED ORDER — ACETAMINOPHEN 325 MG PO TABS: 650.0000 mg | ORAL_TABLET | ORAL | Status: DC | PRN

## 2015-12-12 MED ORDER — PROPOFOL 10 MG/ML IV BOLUS
INTRAVENOUS | Status: DC | PRN
  Administered 2015-12-12: 90 mg via INTRAVENOUS

## 2015-12-12 MED ORDER — EPHEDRINE SULFATE 50 MG/ML IJ SOLN
INTRAMUSCULAR | Status: AC
  Filled 2015-12-12: qty 1

## 2015-12-12 MED ORDER — OXYCODONE HCL 5 MG/5ML PO SOLN
5.0000 mg | Freq: Once | ORAL | Status: DC | PRN
  Filled 2015-12-12: qty 5

## 2015-12-12 MED ORDER — SODIUM CHLORIDE 0.9 % IV SOLN
INTRAVENOUS | Status: DC
  Administered 2015-12-12 – 2015-12-13 (×2): via INTRAVENOUS

## 2015-12-12 MED ORDER — SODIUM CHLORIDE 0.9 % IR SOLN
Status: DC | PRN
  Administered 2015-12-12: 18000 mL via INTRAVESICAL

## 2015-12-12 MED ORDER — EPHEDRINE SULFATE 50 MG/ML IJ SOLN
INTRAMUSCULAR | Status: DC | PRN
  Administered 2015-12-12 (×3): 10 mg via INTRAVENOUS

## 2015-12-12 MED ORDER — CIPROFLOXACIN IN D5W 400 MG/200ML IV SOLN
400.0000 mg | INTRAVENOUS | Status: AC
  Administered 2015-12-12: 400 mg via INTRAVENOUS

## 2015-12-12 MED ORDER — FENTANYL CITRATE (PF) 100 MCG/2ML IJ SOLN: 25.0000 ug | INTRAMUSCULAR | Status: DC | PRN

## 2015-12-12 MED ORDER — DEXAMETHASONE SODIUM PHOSPHATE 10 MG/ML IJ SOLN
INTRAMUSCULAR | Status: AC
  Filled 2015-12-12: qty 1

## 2015-12-12 MED ORDER — PROPOFOL 10 MG/ML IV BOLUS
INTRAVENOUS | Status: AC
  Filled 2015-12-12: qty 20

## 2015-12-12 MED ORDER — LEVOFLOXACIN 500 MG PO TABS
500.0000 mg | ORAL_TABLET | Freq: Every day | ORAL | Status: DC
  Administered 2015-12-12: 500 mg via ORAL
  Filled 2015-12-12: qty 1

## 2015-12-12 MED ORDER — FENTANYL CITRATE (PF) 100 MCG/2ML IJ SOLN
INTRAMUSCULAR | Status: DC | PRN
  Administered 2015-12-12: 25 ug via INTRAVENOUS

## 2015-12-12 MED ORDER — ACETAMINOPHEN 500 MG PO TABS
1000.0000 mg | ORAL_TABLET | Freq: Four times a day (QID) | ORAL | Status: AC
  Administered 2015-12-12 – 2015-12-13 (×4): 1000 mg via ORAL
  Filled 2015-12-12 (×4): qty 2

## 2015-12-12 MED ORDER — OXYCODONE HCL 5 MG PO TABS: 5.0000 mg | ORAL_TABLET | Freq: Once | ORAL | Status: DC | PRN

## 2015-12-12 MED ORDER — CIPROFLOXACIN IN D5W 200 MG/100ML IV SOLN
200.0000 mg | Freq: Two times a day (BID) | INTRAVENOUS | Status: AC
  Administered 2015-12-12 – 2015-12-13 (×2): 200 mg via INTRAVENOUS
  Filled 2015-12-12 (×3): qty 100

## 2015-12-12 MED ORDER — HYDROCODONE-ACETAMINOPHEN 5-325 MG PO TABS: 1.0000 | ORAL_TABLET | ORAL | Status: DC | PRN

## 2015-12-12 MED ORDER — LIDOCAINE HCL (CARDIAC) 20 MG/ML IV SOLN
INTRAVENOUS | Status: AC
  Filled 2015-12-12: qty 5

## 2015-12-12 SURGICAL SUPPLY — 22 items
BAG URINE DRAINAGE (UROLOGICAL SUPPLIES) ×2 IMPLANT
BAG URO CATCHER STRL LF (MISCELLANEOUS) ×2 IMPLANT
BLADE SURG 15 STRL LF DISP TIS (BLADE) ×1 IMPLANT
BLADE SURG 15 STRL SS (BLADE) ×1
CATH FOLEY 3WAY 30CC 20FR (CATHETERS) ×2 IMPLANT
CATH FOLEY 3WAY 30CC 24FR (CATHETERS)
CATH URTH STD 24FR FL 3W 2 (CATHETERS) IMPLANT
ELECT REM PT RETURN 9FT ADLT (ELECTROSURGICAL)
ELECTRODE REM PT RTRN 9FT ADLT (ELECTROSURGICAL) IMPLANT
EVACUATOR MICROVAS BLADDER (UROLOGICAL SUPPLIES) ×2 IMPLANT
GLOVE BIOGEL M 8.0 STRL (GLOVE) ×2 IMPLANT
GOWN STRL REUS W/ TWL XL LVL3 (GOWN DISPOSABLE) ×1 IMPLANT
GOWN STRL REUS W/TWL XL LVL3 (GOWN DISPOSABLE) ×3 IMPLANT
KIT ASPIRATION TUBING (SET/KITS/TRAYS/PACK) ×2 IMPLANT
LOOP CUT BIPOLAR 24F LRG (ELECTROSURGICAL) ×2 IMPLANT
MANIFOLD NEPTUNE II (INSTRUMENTS) ×2 IMPLANT
NS IRRIG 1000ML POUR BTL (IV SOLUTION) IMPLANT
PACK CYSTO (CUSTOM PROCEDURE TRAY) ×2 IMPLANT
SUT ETHILON 3 0 PS 1 (SUTURE) IMPLANT
SYR 30ML LL (SYRINGE) ×2 IMPLANT
TUBING CONNECTING 10 (TUBING) ×2 IMPLANT
WIRE COONS/BENSON .038X145CM (WIRE) IMPLANT

## 2015-12-12 NOTE — H&P (Signed)
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   History of Present Illness BPH with LUTS: He developed AUR in 9/12 as well as previous episodes of retention in the past. He has been managed with alpha blockade therapy he and he indicated that he is not interested in surgical intervention. His voiding symptoms are primarily those of frequency and urgency with no nocturia. He began finasteride in '08 and is on an alpha-blocker. He performed CIC q.a.m.  IPSS (09/03/12) - 21/35 with a bother score of mixed.   PVR (09/03/12) - 230 cc    Adenocarcinoma prostate: He was found to have a hard, nodular prostate on exam in 11/12 but elected not to proceed with further evaluation due to a disinterest in treatment if prostate cancer was discovered. His PSA in 7/08 was 7.14 at which time Dr. Serita Butcher did a bone scan and it was noted to be negative for bony metastases. In 10/13 his PSA was noted to be 943.  TRUS/BX 08/05/12: 143 cc prostate.  Pathology: Gleason 5+4 equals 9 in all cores. Stage - T3,N1,M1  Metastatic workup: CT - pelvic lymphadenopathy and pulmonary metastases. Bone scan - positive.  CT scan 12/23/14: Right renal cyst with prostatic prostate cancer showing positive treatment response compared to CT scan in 11/13.  Treatment: ADT initiated 09/03/12 (Degarelix followed by Lupron)  Bone health: DEXA scan 2/14 - no evidence of osteoporosis.  DEXA scan 2/17 - mild osteoporosis therefore he was started on Fosamax 70 mg weekly with calcium and vitamin D supplementation.  He had been taking Xtandi for a while but got to the point where he can no longer take the medication due to the size of the pill. I therefore switched him to Casodex 50 mg q. day.     Gross hematuria: He was evaluated cystoscopically in 12/12 and found to have an irregular appearing prostate as if he had a prior TURP although he has not.    Urinary  retention: He had a catheter placed in the emergency room on 10/21/15 with greater than 400 cc drained from the bladder. This was after previous unsuccessful attempts at catheterization were undertaken.     Interval history: He failed his voiding trial last visit but only was able to hold 120 cc in his bladder. He had been placed on antibiotics and I recommended he remain on those and he then returned for a repeat voiding trial which he failed       IPSS 10/06/12 - 8   Past Medical History Problems  1. History of Arthritis 2. History of cardiac disorder (Z86.79) 3. History of depression (Z86.59) 4. History of heartburn (Z87.898) 5. History of hypercholesterolemia (Z86.39) 6. History of Urinary retention (R33.9)  Surgical History Problems  1. History of Ankle Repair 2. History of CABG 3. History of Needle Biopsy Of Prostate 4. History of Rotator Cuff Repair 5. History of Tonsillectomy  Current Meds 1. Alendronate Sodium 70 MG Oral Tablet; 1 TABLET ONCE A WEEK IN  THE AM WITH A  GLASS OF   WATER, 30 MIN. BEFOR E A  MEAL. REMAIN UPRIGHT;  Therapy: [id number] to (Last Rx:03Feb2017)  Requested for: 03Feb2017 Ordered 2. Bicalutamide 50 MG Oral Tablet; TAKE 1 TABLET ONCE DAILY;  Therapy: [id number] to (Evaluate:29Dec2017)  Requested for: [id number]; Last  Rx:03Jan2017 Ordered 3. Clopidogrel Bisulfate 75 MG Oral Tablet;  Therapy: [id number] to Recorded 4. Donepezil HCl - 10 MG Oral Tablet;  Therapy: [id number] to Recorded  5. Doxepin HCl - 75 MG Oral Capsule;  Therapy: (Recorded:11Aug2008) to Recorded 6. Finasteride 5 MG Oral Tablet; Take 1 tablet daily;  Therapy: 11Aug2008 to (Evaluate:23Dec2015)  Requested for: 29Sep2014; Last  Rx:29Sep2014; Status: ACTIVE - Renewal Denied Ordered 7. Meloxicam 15 MG Oral Tablet;  Therapy: [id number] to Recorded 8. Metoprolol Succinate ER 25 MG Oral Tablet Extended Release 24 Hour;  Therapy: 22Apr2013 to Recorded 9. MM Aspirin 325 MG Oral Tablet;   Therapy: ([id number]) to Recorded 10. Niacin 500 MG TBCR;   Therapy: ([id number]) to Recorded 11. Nitrostat 0.4 MG Sublingual Tablet Sublingual;   Therapy: [id number] to Recorded 12. Rapaflo 8 MG Oral Capsule; TAKE 1 CAPSULE DAILY WITH FOOD;   Therapy: 21Sep2012 to (Evaluate:10Aug2015)  Requested for: [id number]; Last   Rx:12Jan2015 Ordered  Allergies Medication  1. No Known Drug Allergies  Family History Problems  1. Family history of Acute Myocardial Infarction : Father 2. Family history of Death In The Family Father   56- MI 3. Family history of Death In The Family Mother : Mother   72- stroke 4. Family history of Family Health Status Number Of Children   1 son & 2 daughters 42. Family history of Stroke Syndrome : Mother  Social History Problems  1. Denied: History of Alcohol Use (History) 2. Caffeine Use   rare caffeine drink 3. Former smoker [phone])   smoked 1 ppd for 20 yrs & quit in 1968 4. Marital History - Currently Married 5. Marital History - Widowed 6. Occupation:   retired  Review of Systems Genitourinary and gastrointestinal system(s) were reviewed and pertinent findings if present are noted and are otherwise negative.  Genitourinary: urinary frequency and feelings of urinary urgency, but no nocturia and no hematuria.  Constitutional: no recent weight loss.  Cardiovascular: chest pain.  Respiratory: shortness of breath.  Musculoskeletal: no bone pain.   Vitals Vital Signs  Height: 5 ft 7 in Weight: 178 lb  BMI Calculated: 27.88 BSA Calculated: 1.92 Blood Pressure: 106 / 57 Heart Rate: 87    Physical Exam Constitutional: Well nourished and well developed.  ENT:. The ears and nose are normal in appearance. The oropharynx is normal.  Neck: The appearance of the neck is normal and no neck mass is present.  Pulmonary: No respiratory distress and clear bilateral breath sounds.  Cardiovascular: Heart rate and rhythm are normal .  No peripheral edema.  Abdomen: The abdomen is soft and nontender. No masses are palpated. The abdomen is no rebound. No CVA tenderness.  Genitourinary: Examination of the penis demonstrates an indwelling catheter, but no swelling, no tenderness, no balanitis, no lesions and a normal meatus. The penis is circumcised. The scrotum is normal in appearance, not erythematous and without lesions. The right vas deferens is is palpably normal. The left vas deferens is palpably normal. The right epididymis is palpably normal. The left epididymis is palpably normal. The right testis is non-tender and without masses. The left testis is non-tender and without masses.  Lymphatics: The posterior cervical and supraclavicular nodes are not enlarged or tender.  Skin: Normal skin turgor and no visible rash.  Neuro/Psych: Mood and affect are appropriate. No focal sensory deficits.      Assessment   We discussed the fact that he again did not void spontaneously. The options at this point would be to replace the Foley catheter and maintain this indwelling with catheter changes although he really did not like the catheter bag. The second option is to have him perform intermittent self-catheterization.  He has perform this in the past and understands that he would have to perform self-catheterization about 4 times a day and prior to going to bed at night. The third option would be a transurethral resection of the prostate. I have discussed how this procedure is performed with him and gone over it with him. We discussed the risks and complications as well as the need for an overnight stay in the hospital. In addition he will come off of his Plavix for 5 days prior to the procedure.       Plan   1. He has come off of his Plavix prior to surgery.  2. A urine culture performed one week prior to his surgery was found to be negative.  3. He is scheduled for channel TURP.

## 2015-12-12 NOTE — Anesthesia Preprocedure Evaluation (Addendum)
Anesthesia Evaluation  Patient identified by MRN, date of birth, ID band Patient awake    Reviewed: Allergy & Precautions, NPO status , Patient's Chart, lab work & pertinent test results  Airway Mallampati: II   Neck ROM: Full    Dental  (+) Dental Advisory Given, Teeth Intact   Pulmonary neg pulmonary ROS, COPD, former smoker,    breath sounds clear to auscultation       Cardiovascular + CABG  negative cardio ROS  + dysrhythmias Atrial Fibrillation  Rhythm:Irregular  AF, to hold plavix for 5 days, rate controlled, apparently not on Plavix   Neuro/Psych negative neurological ROS  negative psych ROS   GI/Hepatic negative GI ROS, Neg liver ROS,   Endo/Other  negative endocrine ROS  Renal/GU negative Renal ROSobstruction   Prostate CA negative genitourinary   Musculoskeletal negative musculoskeletal ROS (+)   Abdominal   Peds negative pediatric ROS (+)  Hematology negative hematology ROS (+) 11/32   Anesthesia Other Findings   Reproductive/Obstetrics negative OB ROS                            Anesthesia Physical Anesthesia Plan  ASA: III  Anesthesia Plan: General   Post-op Pain Management:    Induction: Intravenous  Airway Management Planned: Oral ETT and LMA  Additional Equipment:   Intra-op Plan:   Post-operative Plan:   Informed Consent: I have reviewed the patients History and Physical, chart, labs and discussed the procedure including the risks, benefits and alternatives for the proposed anesthesia with the patient or authorized representative who has indicated his/her understanding and acceptance.     Plan Discussed with:   Anesthesia Plan Comments:         Anesthesia Quick Evaluation

## 2015-12-12 NOTE — Anesthesia Procedure Notes (Signed)
Procedure Name: LMA Insertion Date/Time: 12/12/2015 10:04 AM Performed by: Danley Danker L Patient Re-evaluated:Patient Re-evaluated prior to inductionOxygen Delivery Method: Circle system utilized Preoxygenation: Pre-oxygenation with 100% oxygen Intubation Type: IV induction Ventilation: Mask ventilation without difficulty LMA Size: 4.0 Number of attempts: 1 Placement Confirmation: positive ETCO2 and breath sounds checked- equal and bilateral Tube secured with: Tape Dental Injury: Teeth and Oropharynx as per pre-operative assessment

## 2015-12-12 NOTE — Transfer of Care (Signed)
Immediate Anesthesia Transfer of Care Note  Patient: Adam Nielsen  Procedure(s) Performed: Procedure(s): TRANSURETHRAL RESECTION OF THE PROSTATE  (N/A)  Patient Location: PACU  Anesthesia Type:General  Level of Consciousness: awake and alert   Airway & Oxygen Therapy: Patient Spontanous Breathing and Patient connected to face mask oxygen  Post-op Assessment: Report given to RN and Post -op Vital signs reviewed and stable  Post vital signs: Reviewed and stable  Last Vitals:  Filed Vitals:   12/12/15 0729  BP: 108/78  Pulse: 95  Temp: 36.6 C  Resp: 18    Complications: No apparent anesthesia complications

## 2015-12-12 NOTE — Anesthesia Postprocedure Evaluation (Signed)
Anesthesia Post Note  Patient: Adam Nielsen  Procedure(s) Performed: Procedure(s) (LRB): TRANSURETHRAL RESECTION OF THE PROSTATE  (N/A)  Patient location during evaluation: PACU Anesthesia Type: General Level of consciousness: awake and alert Pain management: pain level controlled Vital Signs Assessment: post-procedure vital signs reviewed and stable Respiratory status: spontaneous breathing, nonlabored ventilation, respiratory function stable and patient connected to nasal cannula oxygen Cardiovascular status: blood pressure returned to baseline and stable Postop Assessment: no signs of nausea or vomiting Anesthetic complications: no    Last Vitals:  Filed Vitals:   12/12/15 1130 12/12/15 1137  BP: 124/73   Pulse: 102 100  Temp:    Resp: 24 20    Last Pain:  Filed Vitals:   12/12/15 1138  PainSc: 0-No pain                 Alexis Frock

## 2015-12-12 NOTE — Progress Notes (Signed)
Patient ID: Adam Nielsen, male   DOB: [birth year], 82 y.o.   MRN: [id number] Day of Surgery Subjective: The patient is doing well.  No SP pain, nausea or vomiting. He remains pleasantly demented.  Objective: Vital signs in last 24 hours: Temp:  [97.6 F (36.4 C)-98.1 F (36.7 C)] 98.1 F (36.7 C) (03/27 1316) Pulse Rate:  [82-102] 100 (03/27 1316) Resp:  [16-24] 16 (03/27 1316) BP: (108-124)/(71-89) 122/71 mmHg (03/27 1316) SpO2:  [96 %-100 %] 99 % (03/27 1316) Weight:  [71.033 kg (156 lb 9.6 oz)] 71.033 kg (156 lb 9.6 oz) (03/27 0729)  Intake/Output from previous day:   Intake/Output this shift: Total I/O In: 1161.3 [I.V.:1061.3; Other:100] Out: 900 [Urine:900]  Physical Exam:  General: Alert  CV: RRR Lungs: Clear bilaterally. GI: Soft, Nondistended. Urine: Red on CBI with no clots.   Lab Results: No results for input(s): HGB, HCT in the last 72 hours.  Assessment/Plan: POD# 1 s/p TURP  Per post op orders.

## 2015-12-12 NOTE — Discharge Instructions (Signed)
Post transurethral resection of the prostate (TURP) instructions  Your recent prostate surgery requires very special post hospital care. Despite the fact that no skin incisions were used the area around the prostate incision is quite raw and is covered with a scab to promote healing and prevent bleeding. Certain cautions are needed to assure that the scab is not disturbed of the next 2-3 weeks while the healing proceeds.  Because the raw surface in your prostate and the irritating effects of urine you may expect frequency of urination and/or urgency (a stronger desire to urinate) and perhaps even getting up at night more often. This will usually resolve or improve slowly over the healing period. You may see some blood in your urine over the first 6 weeks. Do not be alarmed, even if the urine was clear for a while. Get off your feet and drink lots of fluids until clearing occurs. If you start to pass clots or don't improve call us.  Catheter: (If you are discharged with a catheter.) 1. Keep your catheter secured to your leg at all times with tape or the supplied strap. 2. You may experience leakage of urine around your catheter- as long as the  catheter continues to drain, this is normal.  If your catheter stops draining  go to the ER. 3. You may also have blood in your urine, even after it has been clear for  several days; you may even pass some small blood clots or other material.  This  is normal as well.  If this happens, sit down and drink plenty of water to help  make urine to flush out your bladder.  If the blood in your urine becomes worse  after doing this, contact our office or return to the ER. 4. You may use the leg bag (small bag) during the day, but use the large bag at  night.  Diet:  You may return to your normal diet immediately. Because of the raw surface of your bladder, alcohol, spicy foods, foods high in acid and drinks with caffeine may cause irritation or frequency and  should be used in moderation. To keep your urine flowing freely and avoid constipation, drink plenty of fluids during the day (8-10 glasses). Tip: Avoid cranberry juice because it is very acidic.  Activity:  Your physical activity doesn't need to be restricted. However, if you are very active, you may see some blood in the urine. We suggest that you reduce your activity under the circumstances until the bleeding has stopped.  Bowels:  It is important to keep your bowels regular during the postoperative period. Straining with bowel movements can cause bleeding. A bowel movement every other day is reasonable. Use a mild laxative if needed, such as milk of magnesia 2-3 tablespoons, or 2 Dulcolax tablets. Call if you continue to have problems. If you had been taking narcotics for pain, before, during or after your surgery, you may be constipated. Take a laxative if necessary.  Medication:  You should resume your pre-surgery medications unless told not to. YOU MAY RESUME YOUR PLAVIX IN 48 HRS AS LONG AS THE URINE HAS REMAINED CLEAR. In addition you may be given an antibiotic to prevent or treat infection. Antibiotics are not always necessary. All medication should be taken as prescribed until the bottles are finished unless you are having an unusual reaction to one of the drugs.     Problems you should report to [place]:  a. Fever greater than 101F. b. Heavy bleeding,  or clots (see notes above about blood in urine). c. Inability to urinate. d. Drug reactions (hives, rash, nausea, vomiting, diarrhea). e. Severe burning or pain with urination that is not improving.

## 2015-12-12 NOTE — Op Note (Signed)
PATIENT:  Adam Nielsen  PRE-OPERATIVE DIAGNOSIS: BPH with urinary retention  POST-OPERATIVE DIAGNOSIS: 1. BPH with urinary retention 2. Bladder calculi  PROCEDURE: 1. TURP 2. Removal of bladder calculi  SURGEON:  Claybon Jabs  INDICATION: Adam Nielsen is a 82 year old male with a history of prostate cancer and BPH with outlet obstruction who developed urinary retention. Multiple attempts at voiding trials were undertaken with maximum medical management and were unsuccessful. We discussed the treatment options and the patient elected to proceed with surgical intervention.  ANESTHESIA:  General  EBL:  Minimal  DRAINS: 40 [place], three-way Foley catheter  SPECIMEN:  Prostate chips to pathology  After informed consent the patient was brought to the major OR and placed on the table. He was administered general anesthesia and then moved to the dorsal lithotomy position. His genitalia was sterilely prepped and draped and an official timeout was then performed.  Initially the 28 French resectoscope sheath with the visual obturator was passed into the bladder and the obturator removed. I then inserted the resectoscope element with 30 lens and  performed a systematic inspection of the bladder. I noted the bladder had 3-4+ trabeculation but was free of any tumor or inflammatory lesions other than the area on the posterior wall where the Foley catheter had caused some irritation. He had 2 small stones seen within the bladder. The ureteral orifices were noted to be of normal configuration and position. Withdrawing the scope into the prostatic urethra I noted obstructing bilobar hypertrophy with some elongation of the prostatic urethra and a high bladder neck but no significant median lobe component.  I was able to irrigate the stones out of his bladder.  Resection was then begun. I first resected the median bar/high bladder neck in the midline down to the level of the bladder floor. I then  began resecting the left lobe of the prostate by resecting first from the level of the bladder neck back to the level of the Veru at the 5:00 position and then progressed in a counterclockwise direction resecting all of the adenomatous tissue of the left lobe down to the surgical capsule. I noted primarily at the 5 to 7:00 portion of the prostate there were extensive prostatic calcifications that were identified as I unroofed them by resecting away the overlying prostatic tissue. These were flushed into the bladder and removed with the prostatic chips using the Microvasive evacuator.Bleeding points were cauterized as they were encountered. I then turned my attention to the right lobe of the prostate and it was resected in an identical fashion. Tissue in the area of the apex was then resected circumferentially with care being taken to maintain the resection proximal to the Veru at all times. The prostatic chips and remaining prostatic calculi  were then flushed into the bladder and the Microvasive evacuator was then used to evacuate all chips and calculi  from the bladder. Reinspection of the bladder revealed the mucosa to be intact, the ureteral orifices intact as well and well away from the bladder neck and area of resection. There were no prostatic chips remaining within the bladder. The prostatic capsule was intact throughout with no perforation and there was no active bleeding noted at the end of the procedure.  The resectoscope was therefore removed and the 20 French three-way Foley catheter was then inserted and balloon was filled to 30 cc. This was placed on mild traction and the bladder was irrigated with the irrigant returning clear. The catheter was then hooked  to closed system drainage and continuous irrigation and the patient was awakened and taken to recovery room in stable and satisfactory condition. He tolerated the procedure well and there were no intraoperative complications.  PLAN OF CARE:  Observation overnight with anticipated discharge in the morning.  PATIENT DISPOSITION:  PACU - Hemodynamically stable.

## 2015-12-13 ENCOUNTER — Inpatient Hospital Stay
Admission: EM | Admit: 2015-12-13 | Discharge: 2015-12-21 | DRG: 280 | Disposition: A | Discharge status: Skilled Nursing Facility | Payer: Medicare Other | Attending: Internal Medicine | Admitting: Internal Medicine

## 2015-12-13 DIAGNOSIS — F0390 Unspecified dementia without behavioral disturbance: Secondary | ICD-10-CM | POA: Diagnosis present

## 2015-12-13 DIAGNOSIS — I4891 Unspecified atrial fibrillation: Secondary | ICD-10-CM

## 2015-12-13 DIAGNOSIS — R296 Repeated falls: Secondary | ICD-10-CM | POA: Diagnosis present

## 2015-12-13 DIAGNOSIS — R310 Gross hematuria: Secondary | ICD-10-CM | POA: Diagnosis not present

## 2015-12-13 DIAGNOSIS — R339 Retention of urine, unspecified: Secondary | ICD-10-CM | POA: Insufficient documentation

## 2015-12-13 DIAGNOSIS — L8990 Pressure ulcer of unspecified site, unspecified stage: Secondary | ICD-10-CM | POA: Insufficient documentation

## 2015-12-13 DIAGNOSIS — Z809 Family history of malignant neoplasm, unspecified: Secondary | ICD-10-CM

## 2015-12-13 DIAGNOSIS — Z7982 Long term (current) use of aspirin: Secondary | ICD-10-CM

## 2015-12-13 DIAGNOSIS — E876 Hypokalemia: Secondary | ICD-10-CM | POA: Diagnosis not present

## 2015-12-13 DIAGNOSIS — E039 Hypothyroidism, unspecified: Secondary | ICD-10-CM | POA: Diagnosis present

## 2015-12-13 DIAGNOSIS — E785 Hyperlipidemia, unspecified: Secondary | ICD-10-CM | POA: Diagnosis present

## 2015-12-13 DIAGNOSIS — R338 Other retention of urine: Secondary | ICD-10-CM | POA: Diagnosis present

## 2015-12-13 DIAGNOSIS — Z66 Do not resuscitate: Secondary | ICD-10-CM | POA: Diagnosis present

## 2015-12-13 DIAGNOSIS — Z8546 Personal history of malignant neoplasm of prostate: Secondary | ICD-10-CM

## 2015-12-13 DIAGNOSIS — N401 Enlarged prostate with lower urinary tract symptoms: Secondary | ICD-10-CM | POA: Diagnosis not present

## 2015-12-13 DIAGNOSIS — R570 Cardiogenic shock: Secondary | ICD-10-CM | POA: Diagnosis present

## 2015-12-13 DIAGNOSIS — I480 Paroxysmal atrial fibrillation: Secondary | ICD-10-CM | POA: Diagnosis present

## 2015-12-13 DIAGNOSIS — W19XXXA Unspecified fall, initial encounter: Secondary | ICD-10-CM | POA: Diagnosis present

## 2015-12-13 DIAGNOSIS — R110 Nausea: Secondary | ICD-10-CM | POA: Diagnosis present

## 2015-12-13 DIAGNOSIS — Z87891 Personal history of nicotine dependence: Secondary | ICD-10-CM

## 2015-12-13 DIAGNOSIS — I2510 Atherosclerotic heart disease of native coronary artery without angina pectoris: Secondary | ICD-10-CM | POA: Diagnosis present

## 2015-12-13 DIAGNOSIS — R319 Hematuria, unspecified: Secondary | ICD-10-CM | POA: Insufficient documentation

## 2015-12-13 DIAGNOSIS — N390 Urinary tract infection, site not specified: Secondary | ICD-10-CM | POA: Diagnosis present

## 2015-12-13 DIAGNOSIS — I214 Non-ST elevation (NSTEMI) myocardial infarction: Secondary | ICD-10-CM | POA: Diagnosis not present

## 2015-12-13 DIAGNOSIS — I483 Typical atrial flutter: Secondary | ICD-10-CM | POA: Diagnosis present

## 2015-12-13 DIAGNOSIS — Z8249 Family history of ischemic heart disease and other diseases of the circulatory system: Secondary | ICD-10-CM

## 2015-12-13 DIAGNOSIS — I110 Hypertensive heart disease with heart failure: Secondary | ICD-10-CM | POA: Diagnosis present

## 2015-12-13 DIAGNOSIS — I493 Ventricular premature depolarization: Secondary | ICD-10-CM | POA: Diagnosis present

## 2015-12-13 DIAGNOSIS — W010XXA Fall on same level from slipping, tripping and stumbling without subsequent striking against object, initial encounter: Secondary | ICD-10-CM | POA: Diagnosis present

## 2015-12-13 DIAGNOSIS — Z951 Presence of aortocoronary bypass graft: Secondary | ICD-10-CM

## 2015-12-13 DIAGNOSIS — E8770 Fluid overload, unspecified: Secondary | ICD-10-CM | POA: Diagnosis not present

## 2015-12-13 DIAGNOSIS — I5021 Acute systolic (congestive) heart failure: Secondary | ICD-10-CM | POA: Diagnosis not present

## 2015-12-13 DIAGNOSIS — Z79899 Other long term (current) drug therapy: Secondary | ICD-10-CM

## 2015-12-13 DIAGNOSIS — Z7951 Long term (current) use of inhaled steroids: Secondary | ICD-10-CM

## 2015-12-13 DIAGNOSIS — Z9079 Acquired absence of other genital organ(s): Secondary | ICD-10-CM | POA: Insufficient documentation

## 2015-12-13 DIAGNOSIS — I255 Ischemic cardiomyopathy: Secondary | ICD-10-CM | POA: Diagnosis present

## 2015-12-13 DIAGNOSIS — I959 Hypotension, unspecified: Secondary | ICD-10-CM | POA: Insufficient documentation

## 2015-12-13 HISTORY — DX: Paroxysmal atrial fibrillation: I48.0

## 2015-12-13 HISTORY — DX: Atherosclerotic heart disease of native coronary artery without angina pectoris: I25.10

## 2015-12-13 LAB — URINALYSIS COMPLETE WITH MICROSCOPIC (ARMC ONLY)
BACTERIA UA: NONE SEEN
Bilirubin Urine: NEGATIVE
Glucose, UA: 50 mg/dL — AB
KETONES UR: NEGATIVE mg/dL
LEUKOCYTES UA: NEGATIVE
NITRITE: NEGATIVE
PH: 6 (ref 5.0–8.0)
PROTEIN: 100 mg/dL — AB
SPECIFIC GRAVITY, URINE: 1.019 (ref 1.005–1.030)
Squamous Epithelial / LPF: NONE SEEN

## 2015-12-13 NOTE — ED Notes (Signed)
Pt from [facility] via EMS, EMS was called out for unwitnessed fall, pt denies any pain or injury and cannot tell where he fell. Pt C/O "i cannot pee after they took my catheter out today"

## 2015-12-13 NOTE — Progress Notes (Signed)
After catheter was removed, pt had incontinent urine at 0830. Pt has no voided since then. Encouraged to try to void but he did not. Bladder scan only showed 200cc in bladder. Called to [facility] and spoke with med tech about keeping watch for a void. Just before pt was to leave he went to bathroom and voided. MD made aware. Callie Fielding RN

## 2015-12-13 NOTE — ED Provider Notes (Signed)
[facility] Emergency Department Provider Note  ____________________________________________  Time seen: 11:00 PM  I have reviewed the triage vital signs and the nursing notes.   HISTORY  Chief Complaint Fall and Urinary Retention      HPI Adam Nielsen is a 82 y.o. male with history of dementia atrial fibrillation prostate cancer urinary retention DC'd from [facility] today status post Foley catheter removal. Patient now returns to the emergency department stating that he tripped and fell at [facility]. Patient states that he has been unable to urinate since the catheter was removed today. Patient admitting to suprapubic discomfort.     Past Medical History  Diagnosis Date  . Atrial fibrillation ([place])   . Hypothyroid   . Prostate CA ([place])   . Dementia   . Acute bronchitis 11/22/2015   . Chronic indwelling Foley catheter   . BPH (benign prostatic hypertrophy)   . Hyperlipidemia   . Urinary tract infection 11/22/2015   . Fall     11/22/2015      Patient Active Problem List   Diagnosis Date Noted  . BPH (benign prostatic hypertrophy) with urinary retention 12/12/2015  . UTI (urinary tract infection) 11/23/2015  . UTI (lower urinary tract infection) 11/22/2015  . Prostate cancer ([place]) 11/22/2015  . Hypothyroidism 11/22/2015  . Atrial fibrillation ([place]) 11/22/2015  . Dementia 11/22/2015  . Fall 11/22/2015  . Bronchitis 11/22/2015    Past Surgical History  Procedure Laterality Date  . Coronary artery bypass graft    . Transurethral resection of prostate N/A 12/12/2015    Procedure: TRANSURETHRAL RESECTION OF THE PROSTATE ;  Surgeon: Kathie Rhodes, MD;  Location: [facility];  Service: Urology;  Laterality: N/A;    Current Outpatient Rx  Name  Route  Sig  Dispense  Refill  . albuterol (PROVENTIL HFA;VENTOLIN HFA) 108 (90 Base) MCG/ACT inhaler   Inhalation   Inhale 2 puffs into the lungs every 6 (six) hours as needed for wheezing or  shortness of breath.   1 Inhaler   0   . alendronate (FOSAMAX) 70 MG tablet   Oral   Take 70 mg by mouth once a week. Pt takes on Friday.   Take with a full glass of water on an empty stomach.         Marland Kitchen aspirin EC 325 MG tablet   Oral   Take 325 mg by mouth daily. Reported on 12/06/2015         . benzonatate (TESSALON) 200 MG capsule   Oral   Take 1 capsule (200 mg total) by mouth 3 (three) times daily.   20 capsule   0   . bicalutamide (CASODEX) 50 MG tablet   Oral   Take 50 mg by mouth daily. Reported on 12/13/2015         . clobetasol ointment (TEMOVATE) 0.05 %   Topical   Apply 1 application topically 2 (two) times daily.         Marland Kitchen diltiazem (CARDIZEM) 60 MG tablet   Oral   Take 60 mg by mouth 2 (two) times daily.         Marland Kitchen donepezil (ARICEPT) 10 MG tablet   Oral   Take 10 mg by mouth daily.          Marland Kitchen escitalopram (LEXAPRO) 5 MG tablet   Oral   Take 5 mg by mouth daily.         . finasteride (PROSCAR) 5 MG tablet  Oral   Take 5 mg by mouth daily.         . hydrOXYzine (ATARAX/VISTARIL) 25 MG tablet   Oral   Take 25 mg by mouth every 6 (six) hours as needed for itching.         . levothyroxine (SYNTHROID, LEVOTHROID) 25 MCG tablet   Oral   Take 50 mcg by mouth daily before breakfast.          . metoprolol succinate (TOPROL-XL) 25 MG 24 hr tablet   Oral   Take 25 mg by mouth daily.         . niacin 500 MG tablet   Oral   Take 500 mg by mouth daily.          Marland Kitchen RAPAFLO 8 MG CAPS capsule   Oral   Take 8 mg by mouth daily.           Dispense as written.   . zolpidem (AMBIEN) 5 MG tablet   Oral   Take 2.5 mg by mouth as needed. For sleep           Allergies No known drug allergies  Family History  Problem Relation Age of Onset  . Cancer Brother   . Heart disease Father     Social History Social History  Substance Use Topics  . Smoking status: Former Research scientist (life sciences)  . Smokeless tobacco: None  . Alcohol Use: No     Review of Systems  Constitutional: Negative for fever. Eyes: Negative for visual changes. ENT: Negative for sore throat. Cardiovascular: Negative for chest pain. Respiratory: Negative for shortness of breath. Gastrointestinal: Negative for abdominal pain, vomiting and diarrhea. Genitourinary: Negative for dysuria. Positive for urinary retention Musculoskeletal: Negative for back pain. Skin: Negative for rash. Neurological: Negative for headaches, focal weakness or numbness.   10-point ROS otherwise negative.  ____________________________________________   PHYSICAL EXAM:  VITAL SIGNS: ED Triage Vitals  Enc Vitals Group     BP 12/13/15 2230 102/68 mmHg     Pulse Rate 12/13/15 2230 97     Resp 12/13/15 2230 17     Temp 12/13/15 2138 97.6 F (36.4 C)     Temp src --      SpO2 12/13/15 2230 96 %     Weight --      Height --      Head Cir --      Peak Flow --      Pain Score 12/13/15 2138 0     Pain Loc --      Pain Edu? --      Excl. in [place]? --    . Constitutional: Alert and oriented. Well appearing and in no distress. Eyes: Conjunctivae are normal. PERRL. Normal extraocular movements. ENT   Head: Normocephalic and atraumatic.   Nose: No congestion/rhinnorhea.   Mouth/Throat: Mucous membranes are moist.   Neck: No stridor. Hematological/Lymphatic/Immunilogical: No cervical lymphadenopathy. Cardiovascular: Normal rate, regular rhythm. Normal and symmetric distal pulses are present in all extremities. No murmurs, rubs, or gallops. Respiratory: Normal respiratory effort without tachypnea nor retractions. Breath sounds are clear and equal bilaterally. No wheezes/rales/rhonchi. Gastrointestinal: Suprapubic tenderness palpation. No distention. There is no CVA tenderness. Genitourinary: deferred Musculoskeletal: Nontender with normal range of motion in all extremities. No joint effusions.  No lower extremity tenderness nor edema. Neurologic:  Normal speech  and language. No gross focal neurologic deficits are appreciated. Speech is normal.  Skin:  Skin is warm, dry and intact. No rash noted. Psychiatric:  Mood and affect are normal. Speech and behavior are normal. Patient exhibits appropriate insight and judgment.  ____________________________________________    LABS (pertinent positives/negatives)  Labs Reviewed  URINALYSIS COMPLETEWITH MICROSCOPIC ([facility] ONLY) - Abnormal; Notable for the following:    Color, Urine AMBER (*)    APPearance CLOUDY (*)    Glucose, UA 50 (*)    Hgb urine dipstick 3+ (*)    Protein, ur 100 (*)    All other components within normal limits  CBC - Abnormal; Notable for the following:    WBC 14.8 (*)    RBC 3.83 (*)    Hemoglobin 11.9 (*)    HCT 35.5 (*)    RDW 14.6 (*)    All other components within normal limits  TROPONIN I - Abnormal; Notable for the following:    Troponin I 4.16 (*)    All other components within normal limits  COMPREHENSIVE METABOLIC PANEL - Abnormal; Notable for the following:    Sodium 130 (*)    CO2 18 (*)    Glucose, Bld 128 (*)    BUN 21 (*)    Calcium 8.8 (*)    ALT 13 (*)    GFR calc non Af Amer 56 (*)    All other components within normal limits  CK - Abnormal; Notable for the following:    Total CK 408 (*)    All other components within normal limits     ____________________________________________   EKG  ED ECG REPORT I, [facility] Jelan Batterton, the attending physician, personally viewed and interpreted this ECG.   Date: 12/14/2015  EKG Time: 9:39PM  Rate: 101  Rhythm: Atrial fibrillation with rapid ventricular response  Axis: Normal  Intervals: Irregular  ST&T Change: None     Critical Care performed: CRITICAL CARE Performed by: Gregor Hams   Total critical care time: 30 minutes  Critical care time was exclusive of separately billable procedures and treating other patients.  Critical care was necessary to treat or prevent imminent or  life-threatening deterioration.  Critical care was time spent personally by me on the following activities: development of treatment plan with patient and/or surrogate as well as nursing, discussions with consultants, evaluation of patient's response to treatment, examination of patient, obtaining history from patient or surrogate, ordering and performing treatments and interventions, ordering and review of laboratory studies, ordering and review of radiographic studies, pulse oximetry and re-evaluation of patient's condition.   ____________________________________________   INITIAL IMPRESSION / ASSESSMENT AND PLAN / ED COURSE  Pertinent labs & imaging results that were available during my care of the patient were reviewed by me and considered in my medical decision making (see chart for details).  Full catheter placed after bladder scan revealed 195 ML's of urine following Foley catheter placement greater than 900 ML's of urine was expressed immediately. Patient received IV metoprolol 5 mg persistent H of fibrillation rapid ventricular response. As such patient received diltiazem 5 mg IV. Patient received aspirin 324 mg. Patient discussed with Dr. Marcille Blanco hospitalist on call for hospital admission for further management.  ____________________________________________   FINAL CLINICAL IMPRESSION(S) / ED DIAGNOSES  Final diagnoses:  NSTEMI (non-ST elevated myocardial infarction) [facility])  Urinary retention  Atrial fibrillation with rapid ventricular response (HCC)      Gregor Hams, MD 12/14/15 [phone]

## 2015-12-13 NOTE — Progress Notes (Signed)
CSW confirmed with Laverne at [facility] (ph#: [phone]) that they are able to take patient back today. Per Laverne, no FL2 is needed as patient was here under "outpatient". RN, Robert Bellow to call patient's son to transport back to ALF when ready.    Raynaldo Opitz, [facility] Clinical Social Worker cell #: [phone]

## 2015-12-13 NOTE — Evaluation (Addendum)
Physical Therapy Evaluation Patient Details Name: Adam Nielsen MRN: [id number] DOB: 1925/01/13 Today's Date: 12/13/2015   History of Present Illness  Pt is a 82 y.o. male adm for TURP; PMH of A-Fib, hypothyroid, dementia and postate cancer.    Clinical Impression  Patient evaluated by Physical Therapy with no further acute PT needs identified. All education has been completed and the patient has no further questions.  See below for any follow-up Physical Therapy or equipment needs. PT is signing off. Thank you for this referral. Pt appears at or close to baseline, from ALF; no family present to confirm, if pt needing more assist per family, consider HHPT at ALF     Follow Up Recommendations No PT follow up    Equipment Recommendations  None recommended by PT    Recommendations for Other Services       Precautions / Restrictions Precautions Precautions: Fall Restrictions Weight Bearing Restrictions: No      Mobility  Bed Mobility               General bed mobility comments: NT--pt in chair, sliding out of chair onto foot rest--far enough to set off chair alarm upon PT entering room  Transfers Overall transfer level: Needs assistance Equipment used: Rolling walker (2 wheeled) Transfers: Sit to/from Stand Sit to Stand: Min guard         General transfer comment: cues for safety, to back up to chair etc  Ambulation/Gait Ambulation/Gait assistance: Min assist;Min guard Ambulation Distance (Feet): 140 Feet Assistive device: Rolling walker (2 wheeled) Gait Pattern/deviations: Decreased stride length;Trunk flexed Gait velocity: decreased   General Gait Details: verbal cues for RW distance from self  Stairs            Wheelchair Mobility    Modified Rankin (Stroke Patients Only)       Balance   Sitting-balance support: No upper extremity supported;Feet supported Sitting balance-Leahy Scale: Good       Standing balance-Leahy Scale: Poor  (reliant on UEs for balance, unsteady without external support)                               Pertinent Vitals/Pain Pain Assessment: No/denies pain    Home Living Family/patient expects to be discharged to:: Assisted living               Home Equipment: Walker - 2 wheels      Prior Function           Comments: Utilizes RW for ambulation, no family present. pt has difficulty giving other info regarding prior functional status     Hand Dominance        Extremity/Trunk Assessment   Upper Extremity Assessment: Defer to OT evaluation           Lower Extremity Assessment: Generalized weakness         Communication   Communication: Expressive difficulties  Cognition Arousal/Alertness: Awake/alert Behavior During Therapy: WFL for tasks assessed/performed Overall Cognitive Status: History of cognitive impairments - at baseline                      General Comments      Exercises        Assessment/Plan    PT Assessment Patent does not need any further PT services  PT Diagnosis Generalized weakness   PT Problem List    PT Treatment Interventions     PT  Goals (Current goals can be found in the Care Plan section) Acute Rehab PT Goals Patient Stated Goal: pt does not state PT Goal Formulation: All assessment and education complete, DC therapy    Frequency     Barriers to discharge        Co-evaluation               End of Session Equipment Utilized During Treatment: Gait belt Activity Tolerance: Patient tolerated treatment well Patient left: in chair;with call bell/phone within reach;with chair alarm set      Functional Assessment Tool Used: clinical judgement Functional Limitation: Mobility: Walking and moving around Mobility: Walking and Moving Around Current Status [id number]): At least 1 percent but less than 20 percent impaired, limited or restricted Mobility: Walking and Moving Around Goal Status [phone]): At least  1 percent but less than 20 percent impaired, limited or restricted Mobility: Walking and Moving Around Discharge Status [phone]): At least 1 percent but less than 20 percent impaired, limited or restricted    Time: 0931-0950 PT Time Calculation (min) (ACUTE ONLY): 19 min   Charges:   PT Evaluation $PT Eval Low Complexity: 1 Procedure     PT G Codes:   PT G-Codes **NOT FOR INPATIENT CLASS** Functional Assessment Tool Used: clinical judgement Functional Limitation: Mobility: Walking and moving around Mobility: Walking and Moving Around Current Status [id number]): At least 1 percent but less than 20 percent impaired, limited or restricted Mobility: Walking and Moving Around Goal Status [phone]): At least 1 percent but less than 20 percent impaired, limited or restricted Mobility: Walking and Moving Around Discharge Status [phone]): At least 1 percent but less than 20 percent impaired, limited or restricted    [facility] 12/13/2015, 10:27 AM

## 2015-12-13 NOTE — ED Notes (Signed)
Pt notify this RN the need to void. Assisted pt with urinal but pt reports unable to void. Bladder scanner performed, 192 mL resulted from bladder scan. MD notified.

## 2015-12-13 NOTE — Discharge Summary (Signed)
Physician Discharge Summary  Patient ID: Adam Nielsen MRN: [id number] DOB/AGE: Oct 01, 1924 82 y.o.  Admit date: 12/12/2015 Discharge date: 12/13/2015  Admission Diagnoses: BPH with urinary retention  Discharge Diagnoses:  Active Problems:   BPH (benign prostatic hypertrophy) with urinary retention   Discharged Condition: good  Hospital Course: He developed urinary retention and failed voiding trials.  We therefore elected to proceed with outlet reduction surgery.  He was therefore electively admitted for TURP.  His preoperative culture was found to be negative.  He received perioperative antibiotics in the form of Cipro and underwent uneventful transurethral resection of his prostate with the finding of some bladder stones that were removed at the time of surgery as well.  Postoperatively he was noted to be doing well with no complaints the night of his surgery and slightly bloody urine but no clots.  He was maintained on CBI overnight and did well.  His catheter was removed this morning after his urine had cleared and he will be discharged once he voids.  There is a possibility that he may not void spontaneously and if that is the case the catheter will be reinserted.  He will stop taking Rapaflo and finasteride.  Consults: None   Discharge Exam: Blood pressure 112/71, pulse 80, temperature 98.2 F (36.8 C), temperature source Oral, resp. rate 20, height 5\' 6"  (1.676 m), weight 71.033 kg (156 lb 9.6 oz), SpO2 96 %. General appearance: alert, cooperative and no distress  Abdomen is soft. His Foley catheter has been removed.  Disposition: Home with his son   Discharge Instructions    Discharge patient    Complete by:  As directed             Medication List    STOP taking these medications        finasteride 5 MG tablet  Commonly known as:  PROSCAR     silodosin 8 MG Caps capsule  Commonly known as:  RAPAFLO      TAKE these medications        albuterol 108 (90 Base)  MCG/ACT inhaler  Commonly known as:  PROVENTIL HFA;VENTOLIN HFA  Inhale 2 puffs into the lungs every 6 (six) hours as needed for wheezing or shortness of breath.     alendronate 70 MG tablet  Commonly known as:  FOSAMAX  Take 70 mg by mouth once a week. Pt takes on Friday.   Take with a full glass of water on an empty stomach.     aspirin EC 325 MG tablet  Take 325 mg by mouth daily. Reported on 12/06/2015     benzonatate 200 MG capsule  Commonly known as:  TESSALON  Take 1 capsule (200 mg total) by mouth 3 (three) times daily.     bicalutamide 50 MG tablet  Commonly known as:  CASODEX  Take 50 mg by mouth daily.     clobetasol ointment 0.05 %  Commonly known as:  TEMOVATE  Apply 1 application topically 2 (two) times daily.     diltiazem 60 MG tablet  Commonly known as:  CARDIZEM  Take 60 mg by mouth 2 (two) times daily.     donepezil 10 MG tablet  Commonly known as:  ARICEPT  Take 10 mg by mouth daily.     escitalopram 5 MG tablet  Commonly known as:  LEXAPRO  Take 5 mg by mouth daily.     hydrOXYzine 25 MG tablet  Commonly known as:  ATARAX/VISTARIL  Take 25 mg  by mouth every 6 (six) hours as needed for itching.     levothyroxine 25 MCG tablet  Commonly known as:  SYNTHROID, LEVOTHROID  Take 50 mcg by mouth daily before breakfast.     metoprolol succinate 25 MG 24 hr tablet  Commonly known as:  TOPROL-XL  Take 25 mg by mouth daily.     niacin 500 MG tablet  Take 500 mg by mouth daily.     zolpidem 5 MG tablet  Commonly known as:  AMBIEN  Take 2.5 mg by mouth as needed. For sleep      ASK your doctor about these medications        azithromycin 250 MG tablet  Commonly known as:  ZITHROMAX  Take one tab daily for 3 more days     QUEtiapine 25 MG tablet  Commonly known as:  SEROQUEL  0.5 tabs po nightly for insomnia           Follow-up Information    Follow up with Claybon Jabs, MD On 12/27/2015.   Specialty:  Urology   Why:  For your appiontment  at 11:00   Contact information:   [address] [place] [address] [phone]       Signed: Claybon Jabs 12/13/2015, 7:03 AM

## 2015-12-14 ENCOUNTER — Inpatient Hospital Stay (HOSPITAL_COMMUNITY)
Admit: 2015-12-14 | Discharge: 2015-12-14 | Disposition: A | Discharge status: Home / Self Care | Payer: Medicare Other | Attending: Internal Medicine | Admitting: Internal Medicine

## 2015-12-14 ENCOUNTER — Encounter: Payer: Self-pay | Admitting: Physician Assistant

## 2015-12-14 DIAGNOSIS — I5021 Acute systolic (congestive) heart failure: Secondary | ICD-10-CM | POA: Diagnosis not present

## 2015-12-14 DIAGNOSIS — I110 Hypertensive heart disease with heart failure: Secondary | ICD-10-CM | POA: Diagnosis present

## 2015-12-14 DIAGNOSIS — R338 Other retention of urine: Secondary | ICD-10-CM | POA: Diagnosis present

## 2015-12-14 DIAGNOSIS — N401 Enlarged prostate with lower urinary tract symptoms: Secondary | ICD-10-CM | POA: Diagnosis present

## 2015-12-14 DIAGNOSIS — R319 Hematuria, unspecified: Secondary | ICD-10-CM | POA: Insufficient documentation

## 2015-12-14 DIAGNOSIS — R079 Chest pain, unspecified: Secondary | ICD-10-CM | POA: Diagnosis not present

## 2015-12-14 DIAGNOSIS — Z951 Presence of aortocoronary bypass graft: Secondary | ICD-10-CM | POA: Diagnosis not present

## 2015-12-14 DIAGNOSIS — E785 Hyperlipidemia, unspecified: Secondary | ICD-10-CM | POA: Diagnosis present

## 2015-12-14 DIAGNOSIS — R310 Gross hematuria: Secondary | ICD-10-CM | POA: Diagnosis not present

## 2015-12-14 DIAGNOSIS — Z8546 Personal history of malignant neoplasm of prostate: Secondary | ICD-10-CM | POA: Diagnosis not present

## 2015-12-14 DIAGNOSIS — I483 Typical atrial flutter: Secondary | ICD-10-CM | POA: Diagnosis not present

## 2015-12-14 DIAGNOSIS — W19XXXA Unspecified fall, initial encounter: Secondary | ICD-10-CM | POA: Diagnosis present

## 2015-12-14 DIAGNOSIS — I959 Hypotension, unspecified: Secondary | ICD-10-CM | POA: Diagnosis not present

## 2015-12-14 DIAGNOSIS — Z7951 Long term (current) use of inhaled steroids: Secondary | ICD-10-CM | POA: Diagnosis not present

## 2015-12-14 DIAGNOSIS — F0390 Unspecified dementia without behavioral disturbance: Secondary | ICD-10-CM

## 2015-12-14 DIAGNOSIS — Z9079 Acquired absence of other genital organ(s): Secondary | ICD-10-CM | POA: Diagnosis not present

## 2015-12-14 DIAGNOSIS — I255 Ischemic cardiomyopathy: Secondary | ICD-10-CM | POA: Diagnosis present

## 2015-12-14 DIAGNOSIS — Z66 Do not resuscitate: Secondary | ICD-10-CM | POA: Diagnosis present

## 2015-12-14 DIAGNOSIS — E039 Hypothyroidism, unspecified: Secondary | ICD-10-CM | POA: Diagnosis present

## 2015-12-14 DIAGNOSIS — N390 Urinary tract infection, site not specified: Secondary | ICD-10-CM | POA: Diagnosis present

## 2015-12-14 DIAGNOSIS — Z79899 Other long term (current) drug therapy: Secondary | ICD-10-CM | POA: Diagnosis not present

## 2015-12-14 DIAGNOSIS — I480 Paroxysmal atrial fibrillation: Secondary | ICD-10-CM | POA: Diagnosis present

## 2015-12-14 DIAGNOSIS — I950 Idiopathic hypotension: Secondary | ICD-10-CM | POA: Diagnosis not present

## 2015-12-14 DIAGNOSIS — I9589 Other hypotension: Secondary | ICD-10-CM

## 2015-12-14 DIAGNOSIS — R110 Nausea: Secondary | ICD-10-CM | POA: Diagnosis present

## 2015-12-14 DIAGNOSIS — W010XXA Fall on same level from slipping, tripping and stumbling without subsequent striking against object, initial encounter: Secondary | ICD-10-CM | POA: Diagnosis present

## 2015-12-14 DIAGNOSIS — I2510 Atherosclerotic heart disease of native coronary artery without angina pectoris: Secondary | ICD-10-CM | POA: Diagnosis present

## 2015-12-14 DIAGNOSIS — Z809 Family history of malignant neoplasm, unspecified: Secondary | ICD-10-CM | POA: Diagnosis not present

## 2015-12-14 DIAGNOSIS — Z87891 Personal history of nicotine dependence: Secondary | ICD-10-CM | POA: Diagnosis not present

## 2015-12-14 DIAGNOSIS — R296 Repeated falls: Secondary | ICD-10-CM | POA: Diagnosis present

## 2015-12-14 DIAGNOSIS — I214 Non-ST elevation (NSTEMI) myocardial infarction: Secondary | ICD-10-CM | POA: Diagnosis present

## 2015-12-14 DIAGNOSIS — I4891 Unspecified atrial fibrillation: Secondary | ICD-10-CM | POA: Diagnosis not present

## 2015-12-14 DIAGNOSIS — E8770 Fluid overload, unspecified: Secondary | ICD-10-CM | POA: Diagnosis not present

## 2015-12-14 DIAGNOSIS — I493 Ventricular premature depolarization: Secondary | ICD-10-CM | POA: Diagnosis present

## 2015-12-14 DIAGNOSIS — E876 Hypokalemia: Secondary | ICD-10-CM | POA: Diagnosis not present

## 2015-12-14 DIAGNOSIS — Z8249 Family history of ischemic heart disease and other diseases of the circulatory system: Secondary | ICD-10-CM | POA: Diagnosis not present

## 2015-12-14 DIAGNOSIS — R570 Cardiogenic shock: Secondary | ICD-10-CM | POA: Diagnosis present

## 2015-12-14 DIAGNOSIS — Z7982 Long term (current) use of aspirin: Secondary | ICD-10-CM | POA: Diagnosis not present

## 2015-12-14 LAB — HEMOGLOBIN A1C: Hgb A1c MFr Bld: 6.3 % — ABNORMAL HIGH (ref 4.0–6.0)

## 2015-12-14 LAB — COMPREHENSIVE METABOLIC PANEL
ALT: 13 U/L — AB (ref 17–63)
AST: 37 U/L (ref 15–41)
Albumin: 3.9 g/dL (ref 3.5–5.0)
Alkaline Phosphatase: 52 U/L (ref 38–126)
Anion gap: 7 (ref 5–15)
BILIRUBIN TOTAL: 0.5 mg/dL (ref 0.3–1.2)
BUN: 21 mg/dL — AB (ref 6–20)
CALCIUM: 8.8 mg/dL — AB (ref 8.9–10.3)
CO2: 18 mmol/L — ABNORMAL LOW (ref 22–32)
CREATININE: 1.13 mg/dL (ref 0.61–1.24)
Chloride: 105 mmol/L (ref 101–111)
GFR, EST NON AFRICAN AMERICAN: 56 mL/min — AB (ref >60)
Glucose, Bld: 128 mg/dL — ABNORMAL HIGH (ref 65–99)
Potassium: 3.7 mmol/L (ref 3.5–5.1)
Sodium: 130 mmol/L — ABNORMAL LOW (ref 135–145)
TOTAL PROTEIN: 6.6 g/dL (ref 6.5–8.1)

## 2015-12-14 LAB — APTT: aPTT: 33 seconds (ref 24–36)

## 2015-12-14 LAB — LACTIC ACID, PLASMA
LACTIC ACID, VENOUS: 2.1 mmol/L — AB (ref 0.5–2.0)
Lactic Acid, Venous: 3.5 mmol/L (ref 0.5–2.0)

## 2015-12-14 LAB — CBC
HCT: 35.5 % — ABNORMAL LOW (ref 40.0–52.0)
HEMOGLOBIN: 11.9 g/dL — AB (ref 13.0–18.0)
MCH: 31.2 pg (ref 26.0–34.0)
MCHC: 33.7 g/dL (ref 32.0–36.0)
MCV: 92.6 fL (ref 80.0–100.0)
Platelets: 205 10*3/uL (ref 150–440)
RBC: 3.83 MIL/uL — AB (ref 4.40–5.90)
RDW: 14.6 % — ABNORMAL HIGH (ref 11.5–14.5)
WBC: 14.8 10*3/uL — ABNORMAL HIGH (ref 3.8–10.6)

## 2015-12-14 LAB — TROPONIN I
Troponin I: 4.16 ng/mL — ABNORMAL HIGH (ref <0.031)
Troponin I: 4.8 ng/mL — ABNORMAL HIGH (ref <0.031)
Troponin I: 3.96 ng/mL — ABNORMAL HIGH (ref <0.031)
Troponin I: 3.7 ng/mL — ABNORMAL HIGH (ref <0.031)

## 2015-12-14 LAB — PROTIME-INR
INR: 1.18
Prothrombin Time: 15.2 seconds — ABNORMAL HIGH (ref 11.4–15.0)

## 2015-12-14 LAB — HEMOGLOBIN
HEMOGLOBIN: 11.3 g/dL — AB (ref 13.0–18.0)
HEMOGLOBIN: 11.1 g/dL — AB (ref 13.0–18.0)

## 2015-12-14 LAB — ECHOCARDIOGRAM COMPLETE

## 2015-12-14 LAB — GLUCOSE, CAPILLARY: Glucose-Capillary: 225 mg/dL — ABNORMAL HIGH (ref 65–99)

## 2015-12-14 LAB — CK: CK TOTAL: 408 U/L — AB (ref 49–397)

## 2015-12-14 LAB — HEPARIN LEVEL (UNFRACTIONATED): Heparin Unfractionated: 0.59 IU/mL (ref 0.30–0.70)

## 2015-12-14 LAB — TSH: TSH: 2.581 u[IU]/mL (ref 0.350–4.500)

## 2015-12-14 MED ORDER — AMIODARONE HCL IN DEXTROSE 360-4.14 MG/200ML-% IV SOLN
60.0000 mg/h | INTRAVENOUS | Status: AC
  Administered 2015-12-14: 60 mg/h via INTRAVENOUS
  Filled 2015-12-14: qty 200

## 2015-12-14 MED ORDER — VANCOMYCIN HCL IN DEXTROSE 1-5 GM/200ML-% IV SOLN: 1000.0000 mg | Freq: Once | INTRAVENOUS | Status: DC

## 2015-12-14 MED ORDER — FINASTERIDE 5 MG PO TABS
5.0000 mg | ORAL_TABLET | Freq: Every day | ORAL | Status: DC
  Administered 2015-12-14 – 2015-12-21 (×8): 5 mg via ORAL
  Filled 2015-12-14 (×8): qty 1

## 2015-12-14 MED ORDER — SODIUM CHLORIDE 0.9 % IV SOLN
INTRAVENOUS | Status: DC
  Administered 2015-12-14: 03:00:00 via INTRAVENOUS

## 2015-12-14 MED ORDER — DILTIAZEM HCL 25 MG/5ML IV SOLN
5.0000 mg | Freq: Once | INTRAVENOUS | Status: AC
  Administered 2015-12-14: 5 mg via INTRAVENOUS
  Filled 2015-12-14: qty 5

## 2015-12-14 MED ORDER — DEXTROSE 5 % IV SOLN: 0.0000 ug/min | INTRAVENOUS | Status: DC

## 2015-12-14 MED ORDER — SODIUM CHLORIDE 0.9% FLUSH
3.0000 mL | Freq: Two times a day (BID) | INTRAVENOUS | Status: DC
  Administered 2015-12-14 – 2015-12-21 (×12): 3 mL via INTRAVENOUS

## 2015-12-14 MED ORDER — ONDANSETRON HCL 4 MG PO TABS: 4.0000 mg | ORAL_TABLET | Freq: Four times a day (QID) | ORAL | Status: DC | PRN

## 2015-12-14 MED ORDER — AMIODARONE HCL IN DEXTROSE 360-4.14 MG/200ML-% IV SOLN
30.0000 mg/h | INTRAVENOUS | Status: DC
  Administered 2015-12-14 (×2): 30 mg/h via INTRAVENOUS
  Filled 2015-12-14 (×4): qty 200

## 2015-12-14 MED ORDER — DONEPEZIL HCL 5 MG PO TABS
10.0000 mg | ORAL_TABLET | Freq: Every day | ORAL | Status: DC
  Administered 2015-12-14 – 2015-12-21 (×8): 10 mg via ORAL
  Filled 2015-12-14 (×8): qty 2

## 2015-12-14 MED ORDER — ALBUTEROL SULFATE HFA 108 (90 BASE) MCG/ACT IN AERS: 2.0000 | INHALATION_SPRAY | Freq: Four times a day (QID) | RESPIRATORY_TRACT | Status: DC | PRN

## 2015-12-14 MED ORDER — PIPERACILLIN-TAZOBACTAM 3.375 G IVPB
3.3750 g | Freq: Three times a day (TID) | INTRAVENOUS | Status: DC
  Administered 2015-12-14 – 2015-12-15 (×2): 3.375 g via INTRAVENOUS
  Filled 2015-12-14 (×5): qty 50

## 2015-12-14 MED ORDER — ACETAMINOPHEN 650 MG RE SUPP: 650.0000 mg | Freq: Four times a day (QID) | RECTAL | Status: DC | PRN

## 2015-12-14 MED ORDER — ALBUTEROL SULFATE (2.5 MG/3ML) 0.083% IN NEBU
2.5000 mg | INHALATION_SOLUTION | Freq: Four times a day (QID) | RESPIRATORY_TRACT | Status: DC | PRN
  Administered 2015-12-17 (×2): 2.5 mg via RESPIRATORY_TRACT
  Filled 2015-12-14 (×2): qty 3

## 2015-12-14 MED ORDER — ONDANSETRON HCL 4 MG/2ML IJ SOLN
4.0000 mg | Freq: Four times a day (QID) | INTRAMUSCULAR | Status: DC | PRN
  Administered 2015-12-14: 4 mg via INTRAVENOUS
  Filled 2015-12-14: qty 2

## 2015-12-14 MED ORDER — SODIUM CHLORIDE 0.9 % IV BOLUS (SEPSIS)
1000.0000 mL | Freq: Once | INTRAVENOUS | Status: AC
  Administered 2015-12-14: 1000 mL via INTRAVENOUS

## 2015-12-14 MED ORDER — TAMSULOSIN HCL 0.4 MG PO CAPS
0.4000 mg | ORAL_CAPSULE | Freq: Every day | ORAL | Status: DC
Start: 2015-12-14 — End: 2015-12-21
  Administered 2015-12-14 – 2015-12-20 (×7): 0.4 mg via ORAL
  Filled 2015-12-14 (×7): qty 1

## 2015-12-14 MED ORDER — SODIUM CHLORIDE 0.9 % IV SOLN
Freq: Once | INTRAVENOUS | Status: AC
  Administered 2015-12-14: 13:00:00 via INTRAVENOUS

## 2015-12-14 MED ORDER — ASPIRIN 81 MG PO CHEW
324.0000 mg | CHEWABLE_TABLET | Freq: Once | ORAL | Status: AC
  Administered 2015-12-14: 324 mg via ORAL

## 2015-12-14 MED ORDER — ACETAMINOPHEN 325 MG PO TABS: 650.0000 mg | ORAL_TABLET | Freq: Four times a day (QID) | ORAL | Status: DC | PRN

## 2015-12-14 MED ORDER — DOCUSATE SODIUM 100 MG PO CAPS
100.0000 mg | ORAL_CAPSULE | Freq: Two times a day (BID) | ORAL | Status: DC
  Administered 2015-12-14 – 2015-12-21 (×15): 100 mg via ORAL
  Filled 2015-12-14 (×15): qty 1

## 2015-12-14 MED ORDER — BICALUTAMIDE 50 MG PO TABS
50.0000 mg | ORAL_TABLET | Freq: Every day | ORAL | Status: DC
  Administered 2015-12-14 – 2015-12-21 (×8): 50 mg via ORAL
  Filled 2015-12-14 (×9): qty 1

## 2015-12-14 MED ORDER — NOREPINEPHRINE 4 MG/250ML-% IV SOLN: 0.0000 ug/min | INTRAVENOUS | Status: DC

## 2015-12-14 MED ORDER — HEPARIN SODIUM (PORCINE) 5000 UNIT/ML IJ SOLN
INTRAMUSCULAR | Status: AC
  Filled 2015-12-14: qty 1

## 2015-12-14 MED ORDER — CLOBETASOL PROPIONATE 0.05 % EX OINT
1.0000 "application " | TOPICAL_OINTMENT | Freq: Two times a day (BID) | CUTANEOUS | Status: DC
  Administered 2015-12-16 – 2015-12-21 (×11): 1 via TOPICAL
  Filled 2015-12-14 (×2): qty 15

## 2015-12-14 MED ORDER — AMIODARONE HCL IN DEXTROSE 360-4.14 MG/200ML-% IV SOLN
30.0000 mg/h | INTRAVENOUS | Status: DC
  Administered 2015-12-14 – 2015-12-17 (×8): 60 mg/h via INTRAVENOUS
  Administered 2015-12-17 – 2015-12-18 (×2): 30 mg/h via INTRAVENOUS
  Filled 2015-12-14 (×23): qty 200

## 2015-12-14 MED ORDER — ASPIRIN EC 325 MG PO TBEC
325.0000 mg | DELAYED_RELEASE_TABLET | Freq: Every day | ORAL | Status: DC
  Administered 2015-12-15 – 2015-12-19 (×5): 325 mg via ORAL
  Filled 2015-12-14 (×6): qty 1

## 2015-12-14 MED ORDER — METOPROLOL TARTRATE 1 MG/ML IV SOLN
5.0000 mg | Freq: Once | INTRAVENOUS | Status: AC
  Administered 2015-12-14: 5 mg via INTRAVENOUS
  Filled 2015-12-14: qty 5

## 2015-12-14 MED ORDER — HEPARIN (PORCINE) IN NACL 100-0.45 UNIT/ML-% IJ SOLN
1000.0000 [IU]/h | INTRAMUSCULAR | Status: DC
  Administered 2015-12-14: 1000 [IU]/h via INTRAVENOUS
  Filled 2015-12-14: qty 250

## 2015-12-14 MED ORDER — SODIUM CHLORIDE 0.9 % IV SOLN
INTRAVENOUS | Status: DC
  Administered 2015-12-14 – 2015-12-17 (×4): via INTRAVENOUS

## 2015-12-14 MED ORDER — PIPERACILLIN-TAZOBACTAM 3.375 G IVPB: 3.3750 g | Freq: Once | INTRAVENOUS | Status: DC

## 2015-12-14 MED ORDER — HYDROXYZINE HCL 25 MG PO TABS: 25.0000 mg | ORAL_TABLET | Freq: Four times a day (QID) | ORAL | Status: DC | PRN

## 2015-12-14 MED ORDER — METOPROLOL SUCCINATE ER 25 MG PO TB24
25.0000 mg | ORAL_TABLET | Freq: Every day | ORAL | Status: DC
  Administered 2015-12-14: 25 mg via ORAL
  Filled 2015-12-14: qty 0.5

## 2015-12-14 MED ORDER — ZOLPIDEM TARTRATE 5 MG PO TABS
2.5000 mg | ORAL_TABLET | Freq: Every evening | ORAL | Status: DC | PRN
  Administered 2015-12-16 – 2015-12-17 (×2): 2.5 mg via ORAL
  Filled 2015-12-14 (×2): qty 1

## 2015-12-14 MED ORDER — NIACIN 500 MG PO TABS
500.0000 mg | ORAL_TABLET | Freq: Every day | ORAL | Status: DC
  Administered 2015-12-14 – 2015-12-21 (×8): 500 mg via ORAL
  Filled 2015-12-14 (×11): qty 1

## 2015-12-14 MED ORDER — BENZONATATE 100 MG PO CAPS
200.0000 mg | ORAL_CAPSULE | Freq: Three times a day (TID) | ORAL | Status: DC
  Administered 2015-12-14 – 2015-12-21 (×21): 200 mg via ORAL
  Filled 2015-12-14 (×23): qty 2

## 2015-12-14 MED ORDER — VANCOMYCIN HCL IN DEXTROSE 1-5 GM/200ML-% IV SOLN
1000.0000 mg | INTRAVENOUS | Status: DC
  Administered 2015-12-14: 1000 mg via INTRAVENOUS
  Filled 2015-12-14 (×2): qty 200

## 2015-12-14 MED ORDER — LEVOTHYROXINE SODIUM 50 MCG PO TABS
50.0000 ug | ORAL_TABLET | Freq: Every day | ORAL | Status: DC
  Administered 2015-12-14 – 2015-12-21 (×8): 50 ug via ORAL
  Filled 2015-12-14 (×9): qty 1

## 2015-12-14 MED ORDER — ESCITALOPRAM OXALATE 10 MG PO TABS
5.0000 mg | ORAL_TABLET | Freq: Every day | ORAL | Status: DC
  Administered 2015-12-14 – 2015-12-21 (×8): 5 mg via ORAL
  Filled 2015-12-14 (×8): qty 1

## 2015-12-14 MED ORDER — VANCOMYCIN HCL IN DEXTROSE 1-5 GM/200ML-% IV SOLN
1000.0000 mg | INTRAVENOUS | Status: AC
  Administered 2015-12-14: 1000 mg via INTRAVENOUS
  Filled 2015-12-14: qty 200

## 2015-12-14 MED ORDER — ASPIRIN 81 MG PO CHEW
CHEWABLE_TABLET | ORAL | Status: AC
  Administered 2015-12-14: 324 mg via ORAL
  Filled 2015-12-14: qty 4

## 2015-12-14 MED ORDER — HEPARIN BOLUS VIA INFUSION
4000.0000 [IU] | Freq: Once | INTRAVENOUS | Status: AC
  Administered 2015-12-14: 4000 [IU] via INTRAVENOUS
  Filled 2015-12-14: qty 4000

## 2015-12-14 MED ORDER — MORPHINE SULFATE (PF) 2 MG/ML IV SOLN
1.0000 mg | INTRAVENOUS | Status: DC | PRN
  Administered 2015-12-17: 1 mg via INTRAVENOUS
  Filled 2015-12-14: qty 1

## 2015-12-14 MED ORDER — DILTIAZEM HCL 60 MG PO TABS
60.0000 mg | ORAL_TABLET | Freq: Two times a day (BID) | ORAL | Status: DC
  Administered 2015-12-14: 60 mg via ORAL
  Filled 2015-12-14: qty 2

## 2015-12-14 NOTE — ED Notes (Addendum)
Pt became hypotensive in 70's with rechecks in bilateral upper extremities x 2. MD Rockey Situ and MD Jimmye Norman at bedside, verbalized to d/c heparin due to bloody urine in urinary cath bag. Orders also given to administer bolus of NS via pressure bag. Pt AAOx4, denies light headedness or dizziness. Pt becoming more pale in appearance.

## 2015-12-14 NOTE — Care Management (Signed)
Patient has extended recovery stay at [facility] 3/27 - 3/28 s/p turp.   He returned to his ALF- [facility] 3/28.  EMS was called to facility 3/28 after patient had unwitnessed fall.  He was unable to urinate.  His foley was removed prior to discharge from [facility]  earlier in the afternoon.  Per ALF, patient was not receiving home health services.  CM has reached out to [facility] to confirm whether patient was receiving any services.  If he is able to discharge back to the facility will definitely benefit from [facility] and PT.  Later found that is current with [place].  Patient ruled in for nstemi and a placed in icu for closer monitoring.

## 2015-12-14 NOTE — ED Notes (Addendum)
Pt experienced BM at this time, cleaned up, new sheets given. Pt tolerated well, NAD noted at this time, vitals stable.

## 2015-12-14 NOTE — Consult Note (Signed)
Cardiology Consultation Note  Patient ID: Adam Nielsen, MRN: [id number], DOB/AGE: 1925-08-11 82 y.o. Admit date: 12/13/2015   Date of Consult: 12/14/2015 Primary Physician: Geoffery Lyons, MD Primary Cardiologist: Dr. Wynonia Lawman, MD  Chief Complaint: Fall Reason for Consult: NSTEMI  HPI: 82 y.o. male with h/o CAD s/p 4 or 5 vessel CABG approximately 1997 with no ischemic evaluations since, PAF not on long term full-dose anticoagulation at this time, dementia, metastatic prostate cancer s/p TURP 12/12/2015, BPH, urinary retention with bladder outlet obstruction requiring indwelling Foley catheter who presented to [facility] on 3/29 after suffering an unwitnessed fall at [facility] on 3/28. Cardiology is consulted for elevated troponin of 4.16-->4.80.   He was previously followed by Dr. Tollie Eth, MD though his son reports he had even forgotten the patient had a cardiologist he had been so long since he has seen his cardiologist. He previously underwent cardiac bypass surgery in the setting of an MI approximately 20 years prior. His son reports this as being a 4 or 5 vessel bypass. His son denies any ischemic evaluation since. There is record of both full-dose aspirin and Plavix 75 mg on some notes and only full-dose aspirin on others. Per the patient's son the patient has only been taking full-dose aspirin, which was increased from 81 mg to 325 mg by his prior PCP. Per comments from [facility] he never received Plavix there. He has PAF of unknown duration. He is not on long term, full-dose anticoagulation. There is no history of GIB or prior falls other than this most recent episode. The son reports the patient was living on his own up until January 2017. At that time it was discovered he was becoming more forgetful and not taking his medications as directed. At that time he was placed in [facility]. His cognitive function continues to decline. He recently underwent TURP at [facility]  on 3/27 without issues. He was discharged back to [place]. On 3/28 he was standing at his doorway at his room when he suddenly felt a "jolt." he next reports being found down on the ground by the staff that works there. He was assessed by them and it was advised he come to the ED. Because of his underlying dementia it is unknown he is was having angina, SOB, palpitations, nausea, diaphoresis, dizziness, presyncope, or if this was a mechanical fall. Upon the patient's arrival to [facility] they were found to have a troponin of 4.16-->4.80, WBC 14.8, hgb 11.9 (which has been stable), CK 408, SCr 1.13. ECG showed sinus tachycardia with PACs, 101 bpm, lateral st/t depression, CXR not performed at this time. UA showed 3+ blood with TNTC RBCs which was consistent with his UA 3 weeks and 1 month prior. He was started on heparin and amiodarone gtts in the ED. BP has been soft in the 0000000 systolic.    Past Medical History  Diagnosis Date  . PAF (paroxysmal atrial fibrillation) (HCC)     a. not on long term full dose anticoagulation  . Hypothyroid   . Prostate CA ([place])   . Dementia   . Acute bronchitis 11/22/2015   . Chronic indwelling Foley catheter   . BPH (benign prostatic hypertrophy)   . Hyperlipidemia   . Urinary tract infection 11/22/2015   . Fall     11/22/2015    . CAD (coronary artery disease)     a. s/p CABG approx 1997; b. patient of Dr. Wynonia Lawman, MD  Most Recent Cardiac Studies: none   Surgical History:  Past Surgical History  Procedure Laterality Date  . Coronary artery bypass graft    . Transurethral resection of prostate N/A 12/12/2015    Procedure: TRANSURETHRAL RESECTION OF THE PROSTATE ;  Surgeon: Kathie Rhodes, MD;  Location: [facility];  Service: Urology;  Laterality: N/A;     Home Meds: Prior to Admission medications   Medication Sig Start Date End Date Taking? Authorizing Provider  albuterol (PROVENTIL HFA;VENTOLIN HFA) 108 (90 Base) MCG/ACT inhaler Inhale 2 puffs into the lungs  every 6 (six) hours as needed for wheezing or shortness of breath. 11/24/15  Yes Loletha Grayer, MD  alendronate (FOSAMAX) 70 MG tablet Take 70 mg by mouth once a week. Pt takes on Friday.   Take with a full glass of water on an empty stomach.   Yes Historical Provider, MD  aspirin EC 325 MG tablet Take 325 mg by mouth daily. Reported on 12/06/2015   Yes Historical Provider, MD  benzonatate (TESSALON) 200 MG capsule Take 1 capsule (200 mg total) by mouth 3 (three) times daily. 11/24/15  Yes Loletha Grayer, MD  bicalutamide (CASODEX) 50 MG tablet Take 50 mg by mouth daily. Reported on 12/13/2015   Yes Historical Provider, MD  clobetasol ointment (TEMOVATE) [id number] % Apply 1 application topically 2 (two) times daily.   Yes Historical Provider, MD  diltiazem (CARDIZEM) 60 MG tablet Take 60 mg by mouth 2 (two) times daily.   Yes Historical Provider, MD  donepezil (ARICEPT) 10 MG tablet Take 10 mg by mouth daily.    Yes Historical Provider, MD  escitalopram (LEXAPRO) 5 MG tablet Take 5 mg by mouth daily.   Yes Historical Provider, MD  finasteride (PROSCAR) 5 MG tablet Take 5 mg by mouth daily.   Yes Historical Provider, MD  hydrOXYzine (ATARAX/VISTARIL) 25 MG tablet Take 25 mg by mouth every 6 (six) hours as needed for itching.   Yes Historical Provider, MD  levothyroxine (SYNTHROID, LEVOTHROID) 25 MCG tablet Take 50 mcg by mouth daily before breakfast.    Yes Historical Provider, MD  metoprolol succinate (TOPROL-XL) 25 MG 24 hr tablet Take 25 mg by mouth daily.   Yes Historical Provider, MD  niacin 500 MG tablet Take 500 mg by mouth daily.    Yes Historical Provider, MD  RAPAFLO 8 MG CAPS capsule Take 8 mg by mouth daily.   Yes Historical Provider, MD  zolpidem (AMBIEN) 5 MG tablet Take 2.5 mg by mouth as needed. For sleep 11/10/15  Yes Historical Provider, MD    Inpatient Medications:  . aspirin EC  325 mg Oral Daily  . benzonatate  200 mg Oral TID  . bicalutamide  50 mg Oral Daily  . clobetasol ointment   1 application Topical BID  . diltiazem  60 mg Oral BID  . docusate sodium  100 mg Oral BID  . donepezil  10 mg Oral Daily  . escitalopram  5 mg Oral Daily  . finasteride  5 mg Oral Daily  . heparin      . levothyroxine  50 mcg Oral QAC breakfast  . metoprolol succinate  25 mg Oral Daily  . niacin  500 mg Oral Daily  . sodium chloride flush  3 mL Intravenous Q12H  . tamsulosin  0.4 mg Oral QPC supper   . sodium chloride 100 mL/hr at 12/14/15 0326  . amiodarone 30 mg/hr (12/14/15 1046)  . heparin Stopped (12/14/15 1231)  . piperacillin-tazobactam (ZOSYN)  IV    .  vancomycin      Allergies: No Known Allergies  Social History   Social History  . Marital Status: Widowed    Spouse Name: N/A  . Number of Children: N/A  . Years of Education: N/A   Occupational History  . Not on file.   Social History Main Topics  . Smoking status: Former Research scientist (life sciences)  . Smokeless tobacco: Not on file  . Alcohol Use: No  . Drug Use: No  . Sexual Activity: Not on file   Other Topics Concern  . Not on file   Social History Narrative     Family History  Problem Relation Age of Onset  . Cancer Brother   . Heart disease Father      Review of Systems: Review of Systems  Unable to perform ROS: dementia    Labs:  Recent Labs  12/14/15 0005 12/14/15 0416  CKTOTAL 408*  --   TROPONINI 4.16* 4.80*   Lab Results  Component Value Date   WBC 14.8* 12/14/2015   HGB 11.9* 12/14/2015   HCT 35.5* 12/14/2015   MCV 92.6 12/14/2015   PLT 205 12/14/2015     Recent Labs Lab 12/14/15 0005  NA 130*  K 3.7  CL 105  CO2 18*  BUN 21*  CREATININE 1.13  CALCIUM 8.8*  PROT 6.6  BILITOT 0.5  ALKPHOS 52  ALT 13*  AST 37  GLUCOSE 128*   No results found for: CHOL, HDL, LDLCALC, TRIG No results found for: DDIMER  Radiology/Studies:  Dg Chest 2 View  11/22/2015  CLINICAL DATA:  Cough and chest pain EXAM: CHEST  2 VIEW COMPARISON:  12/23/2014 FINDINGS: Postop CABG. Negative for heart  failure. Lungs are clear without infiltrate effusion or mass. Unchanged from the prior study. IMPRESSION: No active cardiopulmonary disease. Electronically Signed   By: Franchot Gallo M.D.   On: 11/22/2015 21:17   Ct Head Wo Contrast  11/22/2015  CLINICAL DATA:  Fall x3, possible UTI EXAM: CT HEAD WITHOUT CONTRAST TECHNIQUE: Contiguous axial images were obtained from the base of the skull through the vertex without intravenous contrast. COMPARISON:  12/23/2014 FINDINGS: No evidence of parenchymal hemorrhage or extra-axial fluid collection. No mass lesion, mass effect, or midline shift. No CT evidence of acute infarction. Extensive subcortical white matter and periventricular small vessel ischemic changes. Intracranial atherosclerosis. Global cortical and central atrophy.  No ventriculomegaly. Partial opacification of the bilateral ethmoid sinuses. Mucosal thickening with layering fluid in the bilateral maxillary sinuses. Mastoid air cells are clear. Old right lamina papyracea fracture. No evidence of calvarial fracture. IMPRESSION: No evidence of acute intracranial abnormality. Atrophy with small vessel ischemic changes. Electronically Signed   By: Julian Hy M.D.   On: 11/22/2015 21:03    EKG: sinus tachycardia with PACs, 101 bpm, lateral st/t depression  Weights: There were no vitals filed for this visit.   Physical Exam: Blood pressure 100/60, pulse 99, temperature 97.6 F (36.4 C), resp. rate 22, SpO2 96 %. There is no weight on file to calculate BMI. General: Well developed, well nourished, in no acute distress. Head: Normocephalic, atraumatic, sclera non-icteric, no xanthomas, nares are without discharge.  Neck: Negative for carotid bruits. JVD not elevated. Lungs: Clear bilaterally to auscultation without wheezes, rales, or rhonchi. Breathing is unlabored. Heart: Tachycardic and irregular, with S1 S2. No murmurs, rubs, or gallops appreciated. Abdomen: Soft, non-tender, non-distended  with normoactive bowel sounds. No hepatomegaly. No rebound/guarding. No obvious abdominal masses. Msk:  Strength and tone appear normal for age.  Extremities: No clubbing or cyanosis. No edema.  Distal pedal pulses are 2+ and equal bilaterally. Neuro: Alert. No facial asymmetry. No focal deficit. Moves all extremities spontaneously. Psych:  Responds to questions appropriately with a normal affect.    Assessment and Plan:   1. NSTEMI: -Troponin 4.16-->4.80 at this time -His precipitating event may have been the above fall which actually may have been an acute coronary syndrome  -CK not significantly elevated -He does report hematuria, though this has been longstanding for at least 1 month with a stable hemoglobin making this less likely the culprit  -Cannot rule out demand ischemia in the setting of his hypotension, though the troponin has peaked greater than one would typically see in demand ischemia  -Check echo -BP soft in the 0000000 systolic -On heparin gtt  2. CAD s/p CABG as above: -No ischemic evaluations since his bypass surgery -Not on a beta blocker at this time given his hypotension -Check echo as above -Lipitor  3. PAF: -Currently in sinus rhythm with PACs -On heparin gtt as above -BP precludes BB at this time -Not on long term full-dose anticoagulation, will need to discuss this with the patient and his son prior to stopping heparin and discharge  -CHADS2VASc at least 4 (CHF, age x 2, vascular disease)  4. Metastatic prostate cancer s/p recent TURP with long standing hematuria: -Monitor CBC and UA -Per IM  5. Ischemic cardiomyopathy: -Likely to hold on to volume given his tachycardia -Echo as above -Add HF medications as above  6. Dementia: -Stable   Signed, Christell Faith, PA-C Pager: [phone] 12/14/2015, 12:34 PM

## 2015-12-14 NOTE — Progress Notes (Signed)
*  PRELIMINARY RESULTS* Echocardiogram 2D Echocardiogram has been performed.  Laqueta Jean Hege 12/14/2015, 3:07 PM

## 2015-12-14 NOTE — Progress Notes (Signed)
Pharmacy Antibiotic Note  Adam Nielsen is a 82 y.o. male admitted on 12/13/2015 with possible sepsis.  Pharmacy has been consulted for vancomycin and Zosyn dosing.  Plan: Vancomycin 1000 IV every 24 hours with stacked dosing.  Goal trough 15-20 mcg/mL. Will order a trough with the 5th total dose.   Zosyn 3.375g IV q8h (4 hour infusion).  Height: 5\' 7"  (170.2 cm) Weight: 165 lb 2 oz (74.9 kg) IBW/kg (Calculated) : 66.1  Temp (24hrs), Avg:97.8 F (36.6 C), Min:97.6 F (36.4 C), Max:97.9 F (36.6 C)   Recent Labs Lab 12/14/15 0005 12/14/15 1358  WBC 14.8*  --   CREATININE 1.13  --   LATICACIDVEN  --  3.5*    Estimated Creatinine Clearance: 41.4 mL/min (by C-G formula based on Cr of 1.13).    No Known Allergies  Antimicrobials this admission: 3/29 vancomycin >>  3/29 Zosyn >>   Dose adjustments this admission:   Microbiology results:  BCx: pending  Thank you for allowing pharmacy to be a part of this patient's care.  Ulice Dash D 12/14/2015 3:45 PM

## 2015-12-14 NOTE — Progress Notes (Signed)
Pt is resting at this time. No complaints of pain on my shift. Pt has converted to NSR at this time, remaining on Amiodarone, per Dr.Gollan.   Pt in stable condition at this time. Pt continues to have bloody output in foley. On 2L nasal cannula for comfort.  Report given to [facility] on coming R.N..

## 2015-12-14 NOTE — ED Notes (Addendum)
Pt began to experience nausea and gagging at this time, although no emesis produced. MD Kalisetti at bedside. Verbal order given for zofran, SEE MAR.

## 2015-12-14 NOTE — Progress Notes (Signed)
[place] [facility] [place] NAME: Adam Nielsen    MR#:  [id number]  DATE OF BIRTH:  12-Jun-1925  SUBJECTIVE:  CHIEF COMPLAINT:   Chief Complaint  Patient presents with  . Fall  . Urinary Retention   - patient admitted with fall and noted to have elevated troponin - denies chest pain, but became hypotensive and nauseous in ER - requiring 2 fluid boluses - alert and at baseline, pale appearing  REVIEW OF SYSTEMS:  Review of Systems  Constitutional: Positive for malaise/fatigue. Negative for fever and chills.  HENT: Positive for hearing loss. Negative for ear discharge, ear pain and nosebleeds.   Eyes: Negative for blurred vision, double vision and photophobia.  Respiratory: Positive for shortness of breath. Negative for cough and wheezing.   Cardiovascular: Negative for chest pain, palpitations and leg swelling.  Gastrointestinal: Positive for nausea and abdominal pain. Negative for vomiting, diarrhea and constipation.  Genitourinary: Positive for hematuria. Negative for dysuria.  Musculoskeletal: Positive for back pain. Negative for myalgias.  Neurological: Negative for dizziness, sensory change, speech change, focal weakness, seizures and headaches.  Psychiatric/Behavioral: Negative for depression. The patient is not nervous/anxious.     DRUG ALLERGIES:  No Known Allergies  VITALS:  Blood pressure 97/50, pulse 75, temperature 97.6 F (36.4 C), resp. rate 19, SpO2 94 %.  PHYSICAL EXAMINATION:  Physical Exam  GENERAL:  82 y.o.-year-old elderly patient lying in the bed with no acute distress.  EYES: Pupils equal, round, reactive to light and accommodation. No scleral icterus. Extraocular muscles intact. Pale conjunctiva HEENT: Head atraumatic, normocephalic. Oropharynx and nasopharynx clear.  NECK:  Supple, no jugular venous distention. No thyroid enlargement, no tenderness.  LUNGS: Normal breath sounds bilaterally, no wheezing,  rales,rhonchi or crepitation. No use of accessory muscles of respiration. Decreased bibasilar breath sounds CARDIOVASCULAR: S1, S2 normal. Rapid and irregular. No  rubs, or gallops. 3/6 systolic murmur is present. ABDOMEN: Soft, nontender, nondistended. Bowel sounds present. No organomegaly or mass.  EXTREMITIES: No pedal edema, cyanosis, or clubbing.  NEUROLOGIC: Cranial nerves II through XII are intact. Muscle strength 5/5 in all extremities. Able to move extremities in bed. Sensation intact. Gait not checked.  PSYCHIATRIC: The patient is alert and oriented x 2. Intermittent confusion noted SKIN: No obvious rash, lesion, or ulcer.    LABORATORY PANEL:   CBC  Recent Labs Lab 12/14/15 0005 12/14/15 1308  WBC 14.8*  --   HGB 11.9* 11.3*  HCT 35.5*  --   PLT 205  --    ------------------------------------------------------------------------------------------------------------------  Chemistries   Recent Labs Lab 12/14/15 0005  NA 130*  K 3.7  CL 105  CO2 18*  GLUCOSE 128*  BUN 21*  CREATININE 1.13  CALCIUM 8.8*  AST 37  ALT 13*  ALKPHOS 52  BILITOT 0.5   ------------------------------------------------------------------------------------------------------------------  Cardiac Enzymes  Recent Labs Lab 12/14/15 1142  TROPONINI 3.96*   ------------------------------------------------------------------------------------------------------------------  RADIOLOGY:  No results found.  EKG:   Orders placed or performed during the hospital encounter of 12/13/15  . EKG 12-Lead  . EKG 12-Lead    ASSESSMENT AND PLAN:   82 year old male with past medical history significant for coronary artery disease status post CABG, paroxysmal atrial fibrillation not on anticoagulation, dementia, metastatic prostate cancer status post TURP 2 days ago, urinary retention and bladder outlet obstruction requiring indwelling Foley catheter presents to the hospital secondary to a  fall.  #1 NSTEMI-elevated troponin, but steady now. -Could have had suffered an MI  in the last few days. Currently denies any chest pain. -Heparin drip discontinued due to bleeding and hypertension. Appreciate cardiology consult. -Echocardiogram, monitor on telemetry. -On metoprolol, if blood pressure allows. Add statin -Aspirin started.  #2 hypotension-cardiogenic shock versus septic shock. -Recent instrumentation and urological procedure 2 days ago. Elevated white count and also lactic acid. -Blood cultures have been ordered. Check urine cultures. -Added vancomycin and Zosyn. -Low-dose Levophed to keep map greater than 65. -For cardiogenic shock, echo has been ordered. Received fluid boluses, watch for any heart failure symptoms. -Echocardiograms pending  #3 frequent falls-will have physical therapy evaluation and maybe placement  #4 atrial fibrillation with rapid ventricular response-no history of paroxysmal atrial fibrillation. Currently with rapid ventricular rate. -Blood pressure is low normal, so oral medications or IV Cardizem cannot be used. -Continue amiodarone drip. -Cardiology has been consulted. -Received heparin drip this morning, that has been discontinued due to hematuria and hypertension  #5 BPH-status post TURP done 2 days ago at [facility] by urologist. -Continue Flomax. Has a Foley catheter now - also on casodex, proscar  #6 DVT prophylaxis-heparin drip discontinued due to hematuria and hypertension. -Continue to monitor. Teds and SCDs for now  Physical therapy consult. -Discussed with son who is healthcare power of attorney, at bedside. According to patient's wishes, he is a DO NOT RESUSCITATE.   All the records are reviewed and case discussed with Care Management/Social Workerr. Management plans discussed with the patient, family and they are in agreement.  CODE STATUS: DNR  TOTAL CRITICAL CARE TIME SPENT IN TAKING CARE OF THIS PATIENT: 45  minutes.   POSSIBLE D/C IN 2-3 DAYS, DEPENDING ON CLINICAL CONDITION.   Gladstone Lighter M.D on 12/14/2015 at 3:19 PM  Between 7am to 6pm - Pager - [phone]  After 6pm go to [email or web address] - password [email or web address] [place] [facility] [facility]  Office  [phone]  CC: Primary care physician; Geoffery Lyons, MD

## 2015-12-14 NOTE — Progress Notes (Signed)
Pt currently in A-Fib on monitor. Denies pain/discomort. Amiodarone drip infusing at 30 mg/hr. Pt converting back and forth between NSR and A-Fib. Dr. Rockey Situ at bedside. Updated on Pt's condition. MD reviewed Pt's chart. Verbal orders to switch Amiodarone drip back to 60mg /hr and to switch NS infusion rate to 62ml/hr. MD will return tomorrow to reassess Pt. Will continue to monitor

## 2015-12-14 NOTE — Progress Notes (Addendum)
Dr. Reece Levy notified about Pt's critical Lactic acid of 2.1. Verbal orders to continue current treatment and to recheck in four hours given. Will continue to monitor

## 2015-12-14 NOTE — H&P (Signed)
Adam Nielsen is an 82 y.o. male.   Chief Complaint: Fall HPI: The patient presents to the emergency department from [facility] via EMS after a fall. He states that he simply tripped but he cannot provide details of the incident. The fall was unwitnessed. Past medical history is significant for dementia as well as urinary retention. The patient was recently hospitalized for bladder catheterization and bladder training. The catheter was removed prior to discharge but the patient was found to have more than a liter of urine in his bladder upon presentation today. Shortly following decompression of the patient's bladder is noted to have a run of A. fib with rapid ventricular rate. The patient denied chest pain or shortness of breath. He also denies nausea or vomiting but admits to feeling dizzy although it is unclear if the dizziness was associated with this episode or a previous episode. Laboratory work subsequently showed an elevated troponin which prompted the emergency department staff to call for admission.  Past Medical History  Diagnosis Date  . Atrial fibrillation ([place])   . Hypothyroid   . Prostate CA ([place])   . Dementia   . Acute bronchitis 11/22/2015   . Chronic indwelling Foley catheter   . BPH (benign prostatic hypertrophy)   . Hyperlipidemia   . Urinary tract infection 11/22/2015   . Fall     11/22/2015      Past Surgical History  Procedure Laterality Date  . Coronary artery bypass graft    . Transurethral resection of prostate N/A 12/12/2015    Procedure: TRANSURETHRAL RESECTION OF THE PROSTATE ;  Surgeon: Kathie Rhodes, MD;  Location: [facility];  Service: Urology;  Laterality: N/A;    Family History  Problem Relation Age of Onset  . Cancer Brother   . Heart disease Father    Social History:  reports that he has quit smoking. He does not have any smokeless tobacco history on file. He reports that he does not drink alcohol or use illicit drugs.  Allergies: No Known  Allergies  Prior to Admission medications   Medication Sig Start Date End Date Taking? Authorizing Provider  albuterol (PROVENTIL HFA;VENTOLIN HFA) 108 (90 Base) MCG/ACT inhaler Inhale 2 puffs into the lungs every 6 (six) hours as needed for wheezing or shortness of breath. 11/24/15  Yes Loletha Grayer, MD  alendronate (FOSAMAX) 70 MG tablet Take 70 mg by mouth once a week. Pt takes on Friday.   Take with a full glass of water on an empty stomach.   Yes Historical Provider, MD  aspirin EC 325 MG tablet Take 325 mg by mouth daily. Reported on 12/06/2015   Yes Historical Provider, MD  benzonatate (TESSALON) 200 MG capsule Take 1 capsule (200 mg total) by mouth 3 (three) times daily. 11/24/15  Yes Loletha Grayer, MD  bicalutamide (CASODEX) 50 MG tablet Take 50 mg by mouth daily. Reported on 12/13/2015   Yes Historical Provider, MD  clobetasol ointment (TEMOVATE) 1.96 % Apply 1 application topically 2 (two) times daily.   Yes Historical Provider, MD  diltiazem (CARDIZEM) 60 MG tablet Take 60 mg by mouth 2 (two) times daily.   Yes Historical Provider, MD  donepezil (ARICEPT) 10 MG tablet Take 10 mg by mouth daily.    Yes Historical Provider, MD  escitalopram (LEXAPRO) 5 MG tablet Take 5 mg by mouth daily.   Yes Historical Provider, MD  finasteride (PROSCAR) 5 MG tablet Take 5 mg by mouth daily.   Yes Historical Provider, MD  hydrOXYzine (  ATARAX/VISTARIL) 25 MG tablet Take 25 mg by mouth every 6 (six) hours as needed for itching.   Yes Historical Provider, MD  levothyroxine (SYNTHROID, LEVOTHROID) 25 MCG tablet Take 50 mcg by mouth daily before breakfast.    Yes Historical Provider, MD  metoprolol succinate (TOPROL-XL) 25 MG 24 hr tablet Take 25 mg by mouth daily.   Yes Historical Provider, MD  niacin 500 MG tablet Take 500 mg by mouth daily.    Yes Historical Provider, MD  RAPAFLO 8 MG CAPS capsule Take 8 mg by mouth daily.   Yes Historical Provider, MD  zolpidem (AMBIEN) 5 MG tablet Take 2.5 mg by mouth  as needed. For sleep 11/10/15  Yes Historical Provider, MD     Results for orders placed or performed during the hospital encounter of 12/13/15 (from the past 48 hour(s))  Urinalysis complete, with microscopic ([facility] only)     Status: Abnormal   Collection Time: 12/13/15 11:20 PM  Result Value Ref Range   Color, Urine AMBER (A) YELLOW   APPearance CLOUDY (A) CLEAR   Glucose, UA 50 (A) NEGATIVE mg/dL   Bilirubin Urine NEGATIVE NEGATIVE   Ketones, ur NEGATIVE NEGATIVE mg/dL   Specific Gravity, Urine 1.019 1.005 - 1.030   Hgb urine dipstick 3+ (A) NEGATIVE   pH 6.0 5.0 - 8.0   Protein, ur 100 (A) NEGATIVE mg/dL   Nitrite NEGATIVE NEGATIVE   Leukocytes, UA NEGATIVE NEGATIVE   RBC / HPF TOO NUMEROUS TO COUNT 0 - 5 RBC/hpf   WBC, UA 6-30 0 - 5 WBC/hpf   Bacteria, UA NONE SEEN NONE SEEN   Squamous Epithelial / LPF NONE SEEN NONE SEEN   Mucous PRESENT    Budding Yeast PRESENT   CBC     Status: Abnormal   Collection Time: 12/14/15 12:05 AM  Result Value Ref Range   WBC 14.8 (H) 3.8 - 10.6 K/uL   RBC 3.83 (L) 4.40 - 5.90 MIL/uL   Hemoglobin 11.9 (L) 13.0 - 18.0 g/dL   HCT 35.5 (L) 40.0 - 52.0 %   MCV 92.6 80.0 - 100.0 fL   MCH 31.2 26.0 - 34.0 pg   MCHC 33.7 32.0 - 36.0 g/dL   RDW 14.6 (H) 11.5 - 14.5 %   Platelets 205 150 - 440 K/uL  Troponin I     Status: Abnormal   Collection Time: 12/14/15 12:05 AM  Result Value Ref Range   Troponin I 4.16 (H) <0.031 ng/mL    Comment: READ BACK AND VERIFIED WITH JEANINA RODRIGUEZ @ 0132 ON 12/14/2015 BY [facility]        POSSIBLE MYOCARDIAL ISCHEMIA. SERIAL TESTING RECOMMENDED.   Comprehensive metabolic panel     Status: Abnormal   Collection Time: 12/14/15 12:05 AM  Result Value Ref Range   Sodium 130 (L) 135 - 145 mmol/L   Potassium 3.7 3.5 - 5.1 mmol/L   Chloride 105 101 - 111 mmol/L   CO2 18 (L) 22 - 32 mmol/L   Glucose, Bld 128 (H) 65 - 99 mg/dL   BUN 21 (H) 6 - 20 mg/dL   Creatinine, Ser 1.13 0.61 - 1.24 mg/dL   Calcium 8.8 (L) 8.9 -  10.3 mg/dL   Total Protein 6.6 6.5 - 8.1 g/dL   Albumin 3.9 3.5 - 5.0 g/dL   AST 37 15 - 41 U/L   ALT 13 (L) 17 - 63 U/L   Alkaline Phosphatase 52 38 - 126 U/L   Total Bilirubin 0.5 0.3 - 1.2  mg/dL   GFR calc non Af Amer 56 (L) >60 mL/min   GFR calc Af Amer >60 >60 mL/min    Comment: (NOTE) The eGFR has been calculated using the CKD EPI equation. This calculation has not been validated in all clinical situations. eGFR's persistently <60 mL/min signify possible Chronic Kidney Disease.    Anion gap 7 5 - 15  CK     Status: Abnormal   Collection Time: 12/14/15 12:05 AM  Result Value Ref Range   Total CK 408 (H) 49 - 397 U/L  APTT     Status: None   Collection Time: 12/14/15 12:05 AM  Result Value Ref Range   aPTT 33 24 - 36 seconds   No results found.  Review of Systems  Constitutional: Negative for fever and chills.  HENT: Negative for sore throat and tinnitus.   Eyes: Negative for blurred vision and redness.  Respiratory: Negative for cough and shortness of breath.   Cardiovascular: Negative for chest pain, palpitations, orthopnea and PND.  Gastrointestinal: Negative for nausea, vomiting, abdominal pain and diarrhea.  Genitourinary: Negative for dysuria, urgency and frequency.  Musculoskeletal: Positive for falls. Negative for myalgias and joint pain.  Skin: Negative for rash.       No lesions  Neurological: Negative for speech change, focal weakness and weakness.  Endo/Heme/Allergies: Does not bruise/bleed easily.       No temperature intolerance  Psychiatric/Behavioral: Negative for depression and suicidal ideas.    Blood pressure 90/69, pulse 91, temperature 97.6 F (36.4 C), resp. rate 19, SpO2 91 %. Physical Exam  Constitutional: He is oriented to person, place, and time. He appears well-developed and well-nourished. No distress.  HENT:  Head: Normocephalic and atraumatic.  Mouth/Throat: Oropharynx is clear and moist.  Eyes: Conjunctivae and EOM are normal.  Pupils are equal, round, and reactive to light. No scleral icterus.  Neck: Normal range of motion. Neck supple. No JVD present. No tracheal deviation present. No thyromegaly present.  Cardiovascular: Normal heart sounds.  An irregularly irregular rhythm present. Tachycardia present.  Exam reveals no gallop and no friction rub.   No murmur heard. Respiratory: Effort normal and breath sounds normal. No respiratory distress.  GI: Soft. Bowel sounds are normal. He exhibits no distension. There is no tenderness.  Musculoskeletal: Normal range of motion. He exhibits no edema.  Lymphadenopathy:    He has no cervical adenopathy.  Neurological: He is alert and oriented to person, place, and time. No cranial nerve deficit.  Skin: Skin is warm and dry. No rash noted. No erythema.  Psychiatric: He has a normal mood and affect. His behavior is normal. Judgment and thought content normal.     Assessment/Plan This is an 82 year old male admitted for NSTEMI. 1. NSTEMI: Troponin elevated to 4; patient is chest pain-free. Oxygen saturations are normal on room air but his blood pressure is slightly low. His baseline blood pressure is unknown but peripheral pulses are strong and the patient is mentating at what appears to be baseline given his dementia. Continue cycle cardiac biomarkers. I have placed the patient on a heparin drip. We will consult cardiology and monitor  telemetry. Continue aspirin 2. Atrial fibrillation: Continue diltiazem and metoprolol. I have started the patient on an amiodarone drip without bolus due to his relative hypotension. 3. Urinary retention: Likely secondary to BPH which is part of the patient's past medical history. We will routinely scan his bladder and continue bladder training if necessary. Continue Rapaflo 4. Dementia: Continue donepezil 5.  Hypothyroidism: Continue Synthroid 6. DVT prophylaxis: Therapeutic heparin 7. GI Prophylaxis: None  The patient is a full code. Time spent  on admission was inpatient care approximately 45 minutes   Harrie Foreman, MD 12/14/2015, 3:49 AM

## 2015-12-14 NOTE — Progress Notes (Signed)
ANTICOAGULATION CONSULT NOTE - Initial Consult  Pharmacy Consult for heparin Indication: chest pain/ACS  No Known Allergies  Patient Measurements:   Heparin Dosing Weight: 71 kg  Vital Signs: Temp: 97.6 F (36.4 C) (03/28 2138) BP: 99/76 mmHg (03/29 0155) Pulse Rate: 104 (03/29 0155)  Labs:  Recent Labs  12/14/15 0005  HGB 11.9*  HCT 35.5*  PLT 205  CREATININE 1.13  CKTOTAL 408*  TROPONINI 4.16*    Estimated Creatinine Clearance: 40 mL/min (by C-G formula based on Cr of 1.13).   Medical History: Past Medical History  Diagnosis Date  . Atrial fibrillation ([place])   . Hypothyroid   . Prostate CA ([place])   . Dementia   . Acute bronchitis 11/22/2015   . Chronic indwelling Foley catheter   . BPH (benign prostatic hypertrophy)   . Hyperlipidemia   . Urinary tract infection 11/22/2015   . Fall     11/22/2015      Medications:  Infusions:  . heparin      Assessment: 89 yom cc fall/urinary retention. Persistent AF RVR in ED. Pharmacy consulted to dose heparin.   Goal of Therapy:  Heparin level 0.3-0.7 units/ml Monitor platelets by anticoagulation protocol: Yes   Plan:  Give 4000 units bolus x 1 Start heparin infusion at 1000 units/hr Check anti-Xa level in 8 hours and daily while on heparin Continue to monitor H&H and platelets  Laural Benes, Pharm.D., BCPS Clinical Pharmacist 12/14/2015,2:23 AM

## 2015-12-15 DIAGNOSIS — I959 Hypotension, unspecified: Secondary | ICD-10-CM

## 2015-12-15 DIAGNOSIS — I483 Typical atrial flutter: Secondary | ICD-10-CM | POA: Insufficient documentation

## 2015-12-15 LAB — CBC
HEMATOCRIT: 32.2 % — AB (ref 40.0–52.0)
Hemoglobin: 11 g/dL — ABNORMAL LOW (ref 13.0–18.0)
MCH: 31.8 pg (ref 26.0–34.0)
MCHC: 34.3 g/dL (ref 32.0–36.0)
MCV: 92.9 fL (ref 80.0–100.0)
Platelets: 167 10*3/uL (ref 150–440)
RBC: 3.47 MIL/uL — ABNORMAL LOW (ref 4.40–5.90)
RDW: 15.2 % — AB (ref 11.5–14.5)
WBC: 9.8 10*3/uL (ref 3.8–10.6)

## 2015-12-15 LAB — BASIC METABOLIC PANEL
Anion gap: 5 (ref 5–15)
BUN: 20 mg/dL (ref 6–20)
CALCIUM: 7.8 mg/dL — AB (ref 8.9–10.3)
CO2: 18 mmol/L — AB (ref 22–32)
CREATININE: 1.22 mg/dL (ref 0.61–1.24)
Chloride: 109 mmol/L (ref 101–111)
GFR calc Af Amer: 59 mL/min — ABNORMAL LOW (ref >60)
GFR calc non Af Amer: 51 mL/min — ABNORMAL LOW (ref >60)
GLUCOSE: 163 mg/dL — AB (ref 65–99)
Potassium: 4 mmol/L (ref 3.5–5.1)
Sodium: 132 mmol/L — ABNORMAL LOW (ref 135–145)

## 2015-12-15 LAB — LIPID PANEL
CHOL/HDL RATIO: 4.7 ratio
CHOLESTEROL: 128 mg/dL (ref 0–200)
HDL: 27 mg/dL — ABNORMAL LOW (ref >40)
LDL Cholesterol: 76 mg/dL (ref 0–99)
Triglycerides: 126 mg/dL (ref <150)
VLDL: 25 mg/dL (ref 0–40)

## 2015-12-15 LAB — HEMOGLOBIN: Hemoglobin: 11 g/dL — ABNORMAL LOW (ref 13.0–18.0)

## 2015-12-15 LAB — LACTIC ACID, PLASMA: Lactic Acid, Venous: 1.6 mmol/L (ref 0.5–2.0)

## 2015-12-15 MED ORDER — DEXTROSE 5 % IV SOLN
1.0000 g | INTRAVENOUS | Status: DC
  Administered 2015-12-15 – 2015-12-19 (×5): 1 g via INTRAVENOUS
  Filled 2015-12-15 (×6): qty 10

## 2015-12-15 MED ORDER — ROSUVASTATIN CALCIUM 10 MG PO TABS
5.0000 mg | ORAL_TABLET | Freq: Every day | ORAL | Status: DC
  Administered 2015-12-15 – 2015-12-20 (×6): 5 mg via ORAL
  Filled 2015-12-15 (×6): qty 1

## 2015-12-15 NOTE — Progress Notes (Signed)
Dr.Kalisetti notified of multiple PVC's, and 4 beat run of Vtach earlier this AM. Dr.Kalisetti acknowledged, will consult with Dr.Gollan. Now new orders at this time.

## 2015-12-15 NOTE — Progress Notes (Signed)
Patient: Adam Nielsen / Admit Date: 12/13/2015 / Date of Encounter: 12/15/2015, 8:25 AM   Subjective: Patient pleasantly confused this morning, no distress Review of telemetry shows atrial fibrillation, converting to atrial flutter on amiodarone, ventricular rate 123XX123 bpm .  Systolic pressure [id number], not on pressors at this time Echocardiogram read yesterday showing depressed ejection fraction 35% with regions of wall motion abnormality. IV fluids turned down last night  to 60 cc per hour given a cardia gram showing mildly elevated right heart pressures  Review of Systems: Review of Systems  Unable to perform ROS  secondary to dementia   Objective: Telemetry: Details as above, atrial flutter with ventricular rate 100 bpm  Physical Exam: Blood pressure 87/75, pulse 107, temperature 98.5 F (36.9 C), temperature source Oral, resp. rate 18, height 5\' 7"  (1.702 m), weight 169 lb 8.5 oz (76.9 kg), SpO2 95 %. Body mass index is 26.55 kg/(m^2). General: Well developed, well nourished, in no acute distress. Head: Normocephalic, atraumatic, sclera non-icteric, no xanthomas, nares are without discharge. Neck: Negative for carotid bruits. JVP not elevated. Lungs: Clear bilaterally to auscultation without wheezes, rales, or rhonchi. Breathing is unlabored. Heart:  tachycardia, RRR S1 S2 without murmurs, rubs, or gallops.  Abdomen: Soft, non-tender, non-distended with normoactive bowel sounds. No rebound/guarding. Extremities: No clubbing or cyanosis. No edema. Distal pedal pulses are 2+ and equal bilaterally. Neuro:Sleepy this morning,  Moves all extremities spontaneously. Psych:  Responds to questions, confusion   Intake/Output Summary (Last 24 hours) at 12/15/15 0825 Last data filed at 12/15/15 0600  Gross per 24 hour  Intake 1952.68 ml  Output    675 ml  Net 1277.68 ml    Inpatient Medications:  . aspirin EC  325 mg Oral Daily  . benzonatate  200 mg Oral TID  . bicalutamide  50  mg Oral Daily  . clobetasol ointment  1 application Topical BID  . docusate sodium  100 mg Oral BID  . donepezil  10 mg Oral Daily  . escitalopram  5 mg Oral Daily  . finasteride  5 mg Oral Daily  . levothyroxine  50 mcg Oral QAC breakfast  . niacin  500 mg Oral Daily  . piperacillin-tazobactam (ZOSYN)  IV  3.375 g Intravenous 3 times per day  . sodium chloride flush  3 mL Intravenous Q12H  . tamsulosin  0.4 mg Oral QPC supper  . vancomycin  1,000 mg Intravenous Q24H   Infusions:  . sodium chloride 60 mL/hr at 12/14/15 2002  . amiodarone 60 mg/hr (12/14/15 2051)  . norepinephrine Stopped (12/14/15 1315)    Labs:  Recent Labs  12/14/15 0005 12/15/15 0321  NA 130* 132*  K 3.7 4.0  CL 105 109  CO2 18* 18*  GLUCOSE 128* 163*  BUN 21* 20  CREATININE 1.13 1.22  CALCIUM 8.8* 7.8*    Recent Labs  12/14/15 0005  AST 37  ALT 13*  ALKPHOS 52  BILITOT 0.5  PROT 6.6  ALBUMIN 3.9    Recent Labs  12/14/15 0005  12/14/15 1944 12/15/15 0321  WBC 14.8*  --   --  9.8  HGB 11.9*  < > 11.1* 11.0*  HCT 35.5*  --   --  32.2*  MCV 92.6  --   --  92.9  PLT 205  --   --  167  < > = values in this interval not displayed.  Recent Labs  12/14/15 0005 12/14/15 0416 12/14/15 1142 12/14/15 1547  CKTOTAL 408*  --   --   --  TROPONINI 4.16* 4.80* 3.96* 3.70*   Invalid input(s): POCBNP  Recent Labs  12/14/15 0605  HGBA1C 6.3*     Weights: Filed Weights   12/14/15 1400 12/15/15 0426  Weight: 165 lb 2 oz (74.9 kg) 169 lb 8.5 oz (76.9 kg)     Radiology/Studies:  Dg Chest 2 View  11/22/2015  CLINICAL DATA:  Cough and chest pain EXAM: CHEST  2 VIEW COMPARISON:  12/23/2014 FINDINGS: Postop CABG. Negative for heart failure. Lungs are clear without infiltrate effusion or mass. Unchanged from the prior study. IMPRESSION: No active cardiopulmonary disease. Electronically Signed   By: Franchot Gallo M.D.   On: 11/22/2015 21:17   Ct Head Wo Contrast  11/22/2015  CLINICAL DATA:   Fall x3, possible UTI EXAM: CT HEAD WITHOUT CONTRAST TECHNIQUE: Contiguous axial images were obtained from the base of the skull through the vertex without intravenous contrast. COMPARISON:  12/23/2014 FINDINGS: No evidence of parenchymal hemorrhage or extra-axial fluid collection. No mass lesion, mass effect, or midline shift. No CT evidence of acute infarction. Extensive subcortical white matter and periventricular small vessel ischemic changes. Intracranial atherosclerosis. Global cortical and central atrophy.  No ventriculomegaly. Partial opacification of the bilateral ethmoid sinuses. Mucosal thickening with layering fluid in the bilateral maxillary sinuses. Mastoid air cells are clear. Old right lamina papyracea fracture. No evidence of calvarial fracture. IMPRESSION: No evidence of acute intracranial abnormality. Atrophy with small vessel ischemic changes. Electronically Signed   By: Julian Hy M.D.   On: 11/22/2015 21:03     Assessment and Plan  82 y.o. male  Patient has underlying dementia, poor historian history of coronary disease, CABG  TURP 12/12/15  recent fall  night, details unclear was found down Brought into the hospital, troponin of 4, significant blood in his urine on heparin (stopped) Presented with normal sinus rhythm, converted to atrial fibrillation while in the emergency room Started on amiodarone infusion  systolic pressures down to 70s in ER Echocardiogram  showing regions of wall motion abn, depressed ejection fraction 35%. No prior baseline available.  -----NSTEMI Initial plan was potentially to take him to the cardiac catheterization lab given elevated troponin though given his hemodynamic instability, gross hematuria, this has been delayed. -There is wall motion abnormality on echocardiogram, no recent ischemia workup available. Most recent troponin trending downward For now will manage medically, watch blood count, blood pressure.  If intervention is  needed, he would require aspirin and antiplatelet medication. In the setting of hematuria, this could Pose a problem in thr setting of hematuria - Would hold beta blockers, all nitrates given low blood pressure. Okay to give aspirin and statin.   --- Hypotension Etiology unclear, possibly infection/sepsis (uti), unable to exclude urologic bleed,  would hold all of his blood pressure and prostate medications. - mildly elevated right heart pressures, will need to be cautious with IV fluids   --CAD, s/p CABG Unable to obtain previous records   no recent ischemia workup for family Will likely need some type of ischemia workup prior to discharge if situation permits   --PAF: Presented with normal sinus rhythm, converted to atrial fibrillation in the emergency room, now on amiodarone, now atrial flutter ratae 100 bpm Potentially could cardiovert but pressure may be too low for anesthesia and not on anticoagulation He did have atrial fibrillation November 22 2015. Currently not on a coagulation Will aim for rhythm control.    --Hematuria Recent TURP 2 days ago, Heparin infusion on hold, cath on hold   Total  encounter time more than 35 minutes  Greater than 50% was spent in counseling and coordination of care with the patient   Signed, Esmond Plants, MD, Ph.D. [facility] 12/15/2015, 8:25 AM

## 2015-12-15 NOTE — Plan of Care (Signed)
Problem: Education: Goal: Knowledge of [facility] Education information/materials will improve Outcome: Progressing Pt received pt education materials, had care plan explained, chance to ask questions. Family was also educated, verbalized understanding.

## 2015-12-15 NOTE — Progress Notes (Signed)
[place] [facility] [place] NAME: Adam Nielsen    MR#:  [id number]  DATE OF BIRTH:  10/26/1924  SUBJECTIVE:  CHIEF COMPLAINT:   Chief Complaint  Patient presents with  . Fall  . Urinary Retention   - NSTEMI, cardiogenic shock, systolic BP remains low- not on pressors as maintaining MAP - off heparin drip due to hematuria, will need cath once stable - PVCs and NSVT today- on amiodarone drip for afib - hematuria clearing  REVIEW OF SYSTEMS:  Review of Systems  Constitutional: Positive for malaise/fatigue. Negative for fever and chills.  HENT: Positive for hearing loss. Negative for ear discharge, ear pain and nosebleeds.   Eyes: Negative for blurred vision, double vision and photophobia.  Respiratory: Positive for shortness of breath. Negative for cough and wheezing.   Cardiovascular: Negative for chest pain, palpitations and leg swelling.  Gastrointestinal: Positive for nausea and abdominal pain. Negative for vomiting, diarrhea and constipation.  Genitourinary: Positive for hematuria. Negative for dysuria.  Musculoskeletal: Positive for back pain. Negative for myalgias.  Neurological: Negative for dizziness, sensory change, speech change, focal weakness, seizures and headaches.  Psychiatric/Behavioral: Negative for depression. The patient is not nervous/anxious.     DRUG ALLERGIES:  No Known Allergies  VITALS:  Blood pressure 96/69, pulse 93, temperature 97.5 F (36.4 C), temperature source Oral, resp. rate 16, height 5\' 7"  (1.702 m), weight 76.9 kg (169 lb 8.5 oz), SpO2 95 %.  PHYSICAL EXAMINATION:  Physical Exam  GENERAL:  82 y.o.-year-old elderly patient lying in the bed with no acute distress.  EYES: Pupils equal, round, reactive to light and accommodation. No scleral icterus. Extraocular muscles intact. Pale conjunctiva HEENT: Head atraumatic, normocephalic. Oropharynx and nasopharynx clear.  NECK:  Supple, no jugular  venous distention. No thyroid enlargement, no tenderness.  LUNGS: Normal breath sounds bilaterally, no wheezing, rales,rhonchi or crepitation. No use of accessory muscles of respiration. Decreased bibasilar breath sounds CARDIOVASCULAR: S1, S2 normal. Rapid and irregular. No  rubs, or gallops. 3/6 systolic murmur is present. ABDOMEN: Soft, nontender, nondistended. Bowel sounds present. No organomegaly or mass.  EXTREMITIES: No pedal edema, cyanosis, or clubbing.  NEUROLOGIC: Cranial nerves II through XII are intact. Muscle strength 5/5 in all extremities. Able to move extremities in bed. Sensation intact. Gait not checked.  PSYCHIATRIC: The patient is alert and oriented x 2. Intermittent confusion noted- dementia at baseline SKIN: No obvious rash, lesion, or ulcer.    LABORATORY PANEL:   CBC  Recent Labs Lab 12/15/15 0321 12/15/15 1316  WBC 9.8  --   HGB 11.0* 11.0*  HCT 32.2*  --   PLT 167  --    ------------------------------------------------------------------------------------------------------------------  Chemistries   Recent Labs Lab 12/14/15 0005 12/15/15 0321  NA 130* 132*  K 3.7 4.0  CL 105 109  CO2 18* 18*  GLUCOSE 128* 163*  BUN 21* 20  CREATININE 1.13 1.22  CALCIUM 8.8* 7.8*  AST 37  --   ALT 13*  --   ALKPHOS 52  --   BILITOT 0.5  --    ------------------------------------------------------------------------------------------------------------------  Cardiac Enzymes  Recent Labs Lab 12/14/15 1547  TROPONINI 3.70*   ------------------------------------------------------------------------------------------------------------------  RADIOLOGY:  No results found.  EKG:   Orders placed or performed during the hospital encounter of 12/13/15  . EKG 12-Lead  . EKG 12-Lead    ASSESSMENT AND PLAN:   82 year old male with past medical history significant for coronary artery disease status post CABG, paroxysmal atrial fibrillation  not on  anticoagulation, dementia, metastatic prostate cancer status post TURP 2 days ago, urinary retention and bladder outlet obstruction requiring indwelling Foley catheter presents to the hospital secondary to a fall.  #1 NSTEMI-elevated troponin, but steady now. -Could have had suffered an MI in the last couple of days. Currently denies any chest pain. -Heparin drip discontinued due to bleeding and hypotension. Appreciate cardiology consult. -Echocardiogram with EF 35% and wall motion abnormality, no baseline available, monitor on telemetry. -Add metoprolol, if blood pressure allows. Add statin -Aspirin started.  #2 hypotension-cardiogenic shock likely -However due to recent instrumentation and urological procedure 2 days ago. Elevated white count and also lactic acid. -Blood cultures have been ordered. Check urine cultures. -Received 1 dose of vancomycin. Lactic acid improved. Antibiotics changed to Rocephin -If needed, add Low-dose Levophed to keep map greater than 65.  #3 Acute systolic CHF- hypotensive- fluid overloaded. -Likely ischemic cardiomyopathy. EF is 35%. -Not on diuretics. Monitor IV fluids to prevent pulmonary edema  #4 atrial fibrillation with rapid ventricular response-no history of paroxysmal atrial fibrillation. Currently with rapid ventricular rate. -Blood pressure is low normal, so oral medications or IV Cardizem cannot be used. -Continue amiodarone drip. Also PVCs -Cardiology has been consulted. -heparin drip has been discontinued due to hematuria and hypotension  #5 BPH-status post TURP done 2 days ago at [facility] by urologist. -Continue Flomax. Has a Foley catheter now - also on casodex, proscar -Hematuria is clearing. Hemoglobin is stable. On antibiotics for UTI  #6 DVT prophylaxis-heparin drip discontinued due to hematuria and hypertension. -Continue to monitor. Teds and SCDs for now  Physical therapy consult. -Discussed with son who is healthcare  power of attorney, at bedside on admission. - According to patient's wishes, he is a DO NOT RESUSCITATE.   All the records are reviewed and case discussed with Care Management/Social Workerr. Management plans discussed with the patient, family and they are in agreement.  CODE STATUS: DNR  TOTAL CRITICAL CARE TIME SPENT IN TAKING CARE OF THIS PATIENT: 37 minutes.   POSSIBLE D/C IN 2-3 DAYS, DEPENDING ON CLINICAL CONDITION.   Cortlyn Cannell M.D on 12/15/2015 at 2:49 PM  Between 7am to 6pm - Pager - [phone]  After 6pm go to [email or web address] - password [email or web address] [place] [facility]  Office  [phone]  CC: Primary care physician; Geoffery Lyons, MD

## 2015-12-15 NOTE — Progress Notes (Signed)
Pt has had no complaints of pain on my shift. Pt has rested comfortably today. Family was at bedside most of the day today.

## 2015-12-16 DIAGNOSIS — R339 Retention of urine, unspecified: Secondary | ICD-10-CM | POA: Insufficient documentation

## 2015-12-16 DIAGNOSIS — I480 Paroxysmal atrial fibrillation: Secondary | ICD-10-CM | POA: Insufficient documentation

## 2015-12-16 LAB — CBC
HCT: 31.8 % — ABNORMAL LOW (ref 40.0–52.0)
HEMOGLOBIN: 10.8 g/dL — AB (ref 13.0–18.0)
MCH: 31.9 pg (ref 26.0–34.0)
MCHC: 33.9 g/dL (ref 32.0–36.0)
MCV: 94.2 fL (ref 80.0–100.0)
Platelets: 149 10*3/uL — ABNORMAL LOW (ref 150–440)
RBC: 3.37 MIL/uL — AB (ref 4.40–5.90)
RDW: 15.4 % — ABNORMAL HIGH (ref 11.5–14.5)
WBC: 9 10*3/uL (ref 3.8–10.6)

## 2015-12-16 LAB — GLUCOSE, CAPILLARY: Glucose-Capillary: 155 mg/dL — ABNORMAL HIGH (ref 65–99)

## 2015-12-16 LAB — BASIC METABOLIC PANEL
ANION GAP: 7 (ref 5–15)
BUN: 22 mg/dL — ABNORMAL HIGH (ref 6–20)
CHLORIDE: 111 mmol/L (ref 101–111)
CO2: 16 mmol/L — ABNORMAL LOW (ref 22–32)
Calcium: 7.8 mg/dL — ABNORMAL LOW (ref 8.9–10.3)
Creatinine, Ser: 1.24 mg/dL (ref 0.61–1.24)
GFR calc non Af Amer: 50 mL/min — ABNORMAL LOW (ref >60)
GFR, EST AFRICAN AMERICAN: 58 mL/min — AB (ref >60)
Glucose, Bld: 166 mg/dL — ABNORMAL HIGH (ref 65–99)
POTASSIUM: 3.8 mmol/L (ref 3.5–5.1)
SODIUM: 134 mmol/L — AB (ref 135–145)

## 2015-12-16 MED ORDER — CETYLPYRIDINIUM CHLORIDE 0.05 % MT LIQD
7.0000 mL | Freq: Two times a day (BID) | OROMUCOSAL | Status: DC
  Administered 2015-12-16 – 2015-12-21 (×10): 7 mL via OROMUCOSAL

## 2015-12-16 NOTE — Progress Notes (Signed)
[place] [facility] [place] NAME: Adam Nielsen    MR#:  [id number]  DATE OF BIRTH:  [birth year]  SUBJECTIVE:  CHIEF COMPLAINT:   Chief Complaint  Patient presents with  . Fall  . Urinary Retention   - NSTEMI, cardiogenic shock, systolic BP stable now- not on pressors as maintaining MAP - off heparin drip due to hematuria, will need cath once stable - PVCs and NSVT - on amiodarone drip for afib - hematuria clearing- cardiology planning to resume heparin drip without bolus and cath on Monday.  REVIEW OF SYSTEMS:  Review of Systems  Constitutional: Positive for malaise/fatigue. Negative for fever and chills.  HENT: Positive for hearing loss. Negative for ear discharge, ear pain and nosebleeds.   Eyes: Negative for blurred vision, double vision and photophobia.  Respiratory: Positive for shortness of breath. Negative for cough and wheezing.   Cardiovascular: Negative for chest pain, palpitations and leg swelling.  Gastrointestinal: Negative for nausea, vomiting, abdominal pain, diarrhea and constipation.  Genitourinary: Negative for dysuria and hematuria.  Musculoskeletal: Positive for back pain. Negative for myalgias.  Neurological: Negative for dizziness, sensory change, speech change, focal weakness, seizures and headaches.  Psychiatric/Behavioral: Negative for depression. The patient is not nervous/anxious.     DRUG ALLERGIES:  No Known Allergies  VITALS:  Blood pressure 113/98, pulse 85, temperature 97.6 F (36.4 C), temperature source Oral, resp. rate 20, height 5\' 7"  (1.702 m), weight 78.4 kg (172 lb 13.5 oz), SpO2 97 %.  PHYSICAL EXAMINATION:  Physical Exam  GENERAL:  82 y.o.-year-old elderly patient lying in the bed with no acute distress.  EYES: Pupils equal, round, reactive to light and accommodation. No scleral icterus. Extraocular muscles intact. Pale conjunctiva HEENT: Head atraumatic, normocephalic. Oropharynx and  nasopharynx clear.  NECK:  Supple, no jugular venous distention. No thyroid enlargement, no tenderness.  LUNGS: Normal breath sounds bilaterally, no wheezing, rales,rhonchi or crepitation. No use of accessory muscles of respiration. Decreased bibasilar breath sounds CARDIOVASCULAR: S1, S2 Rapid and irregular. 3/6 systolic murmur is present. ABDOMEN: Soft, nontender, nondistended. Bowel sounds present. No organomegaly or mass. Foley in place, light orange colour urine. EXTREMITIES: No pedal edema, cyanosis, or clubbing.  NEUROLOGIC: Cranial nerves II through XII are intact. Muscle strength 5/5 in all extremities. Able to move extremities in bed. Sensation intact. Gait not checked.  PSYCHIATRIC: The patient is alert and oriented x 2. Intermittent confusion noted- dementia at baseline SKIN: No obvious rash, lesion, or ulcer.    LABORATORY PANEL:   CBC  Recent Labs Lab 12/16/15 0543  WBC 9.0  HGB 10.8*  HCT 31.8*  PLT 149*   ------------------------------------------------------------------------------------------------------------------  Chemistries   Recent Labs Lab 12/14/15 0005  12/16/15 0543  NA 130*  < > 134*  K 3.7  < > 3.8  CL 105  < > 111  CO2 18*  < > 16*  GLUCOSE 128*  < > 166*  BUN 21*  < > 22*  CREATININE 1.13  < > 1.24  CALCIUM 8.8*  < > 7.8*  AST 37  --   --   ALT 13*  --   --   ALKPHOS 52  --   --   BILITOT 0.5  --   --   < > = values in this interval not displayed. ------------------------------------------------------------------------------------------------------------------  Cardiac Enzymes  Recent Labs Lab 12/14/15 1547  TROPONINI 3.70*   ------------------------------------------------------------------------------------------------------------------  RADIOLOGY:  No results found.  EKG:   Orders placed  or performed during the hospital encounter of 12/13/15  . EKG 12-Lead  . EKG 12-Lead    ASSESSMENT AND PLAN:   82 year old male  with past medical history significant for coronary artery disease status post CABG, paroxysmal atrial fibrillation not on anticoagulation, dementia, metastatic prostate cancer status post TURP 2 days ago, urinary retention and bladder outlet obstruction requiring indwelling Foley catheter presents to the hospital secondary to a fall.  #1 NSTEMI-elevated troponin, but steady now. -Could have had suffered an MI in the last couple of days. Currently denies any chest pain. -Heparin drip discontinued due to bleeding and hypotension. Appreciate cardiology consult. -Echocardiogram with EF 35% and wall motion abnormality, no baseline available, monitor on telemetry. -Add metoprolol, if blood pressure allows. Add statin -Aspirin started.- cardio planning cath on Monday.  #2 hypotension-cardiogenic shock likely -However due to recent instrumentation and urological procedure 2 days ago. Elevated white count and also lactic acid. -Blood cultures have been ordered. Check urine cultures. -Received 1 dose of vancomycin. Lactic acid improved. Antibiotics changed to Rocephin, ur cx negative. -no need for vasopressors.  #3 Acute systolic CHF- hypotensive- fluid overloaded. -Likely ischemic cardiomyopathy. EF is 35%. -Not on diuretics. Monitor IV fluids to prevent pulmonary edema  #4 atrial fibrillation with rapid ventricular response-no history of paroxysmal atrial fibrillation. Currently with rapid ventricular rate. -Blood pressure is low normal, so oral medications or IV Cardizem cannot be used. -Continue amiodarone drip. Also PVCs -Cardiology has been consulted. -heparin drip has been discontinued due to hematuria and hypotension - now planning to resume today- without bolus.  #5 BPH-status post TURP done 2 days ago at [facility] by urologist. -Continue Flomax. Has a Foley catheter now - also on casodex, proscar -Hematuria is clearing. Hemoglobin is stable. On antibiotics for UTI  #6 DVT  prophylaxis-heparin drip discontinued due to hematuria and hypertension. -Continue to monitor. Teds and SCDs for now  Physical therapy consult. -Discussed with son who is healthcare power of attorney, at bedside on admission. - According to patient's wishes, he is a DO NOT RESUSCITATE.   All the records are reviewed and case discussed with Care Management/Social Workerr. Management plans discussed with the patient, family and they are in agreement.  CODE STATUS: DNR  TOTAL CRITICAL CARE TIME SPENT IN TAKING CARE OF THIS PATIENT: 35 minutes.   POSSIBLE D/C IN 3-4 DAYS, DEPENDING ON CLINICAL CONDITION. Spoke to his son in room and his daughter on phone.  Vaughan Basta M.D on 12/16/2015 at 9:56 AM  Between 7am to 6pm - Pager - [phone]  After 6pm go to [email or web address] - password [email or web address] [place] [facility]  Office  [phone]  CC: Primary care physician; Geoffery Lyons, MD

## 2015-12-16 NOTE — NC FL2 (Deleted)
[place] LEVEL OF CARE SCREENING TOOL     IDENTIFICATION  Patient Name: Adam Nielsen Birthdate: [birth year] Sex: male Admission Date (Current Location): 12/13/2015  [place] and [place] Number:  Engineering geologist and Address:  [facility], [address] [place], [place] [address]      Provider Number: [id number]  Attending Physician Name and Address:  Vaughan Basta, MD  Relative Name and Phone Number:       Current Level of Care: Hospital Recommended Level of Care: [place] Prior Approval Number:    Date Approved/Denied:   PASRR Number:    Discharge Plan: SNF    Current Diagnoses: Patient Active Problem List   Diagnosis Date Noted  . Urinary retention   . Paroxysmal atrial fibrillation (HCC)   . Typical atrial flutter ([place])   . NSTEMI (non-ST elevated myocardial infarction) ([place]) 12/14/2015  . Atrial fibrillation with rapid ventricular response ([place])   . S/P TURP   . Hematuria   . S/P CABG (coronary artery bypass graft)   . Hypotension   . BPH (benign prostatic hypertrophy) with urinary retention 12/12/2015  . UTI (urinary tract infection) 11/23/2015  . UTI (lower urinary tract infection) 11/22/2015  . Prostate cancer ([place]) 11/22/2015  . Hypothyroidism 11/22/2015  . Atrial fibrillation ([place]) 11/22/2015  . Dementia 11/22/2015  . Fall 11/22/2015  . Bronchitis 11/22/2015    Orientation RESPIRATION BLADDER Height & Weight     Self, Time, Situation, Place  O2 (2.5 litres) Continent Weight: 172 lb 13.5 oz (78.4 kg) Height:  5\' 7"  (170.2 cm)  BEHAVIORAL SYMPTOMS/MOOD NEUROLOGICAL BOWEL NUTRITION STATUS   (none)   Continent Diet (heart smart)  AMBULATORY STATUS COMMUNICATION OF NEEDS Skin   Supervision Verbally Normal                       Personal Care Assistance Level of Assistance  Bathing, Feeding, Dressing Bathing Assistance: Limited assistance Feeding assistance: Limited  assistance Dressing Assistance: Limited assistance     Functional Limitations Info  Sight, Hearing, Speech Sight Info: Adequate Hearing Info: Adequate Speech Info: Adequate    SPECIAL CARE FACTORS FREQUENCY  PT (By licensed PT)     PT Frequency: 5x week OT Frequency: 5x week            Contractures Contractures Info: Not present    Additional Factors Info  Code Status, Allergies Code Status Info: DNR Allergies Info: none known           Current Medications (12/16/2015):  This is the current hospital active medication list Current Facility-Administered Medications  Medication Dose Route Frequency Provider Last Rate Last Dose  . 0.9 %  sodium chloride infusion   Intravenous Continuous Vishal Mungal, MD 75 mL/hr at 12/15/15 1920    . acetaminophen (TYLENOL) tablet 650 mg  650 mg Oral Q6H PRN Harrie Foreman, MD       Or  . acetaminophen (TYLENOL) suppository 650 mg  650 mg Rectal Q6H PRN Harrie Foreman, MD      . albuterol (PROVENTIL) (2.5 MG/3ML) 0.083% nebulizer solution 2.5 mg  2.5 mg Nebulization Q6H PRN Harrie Foreman, MD      . amiodarone (NEXTERONE PREMIX) 360 MG/200ML (1.8 mg/mL) IV infusion  60 mg/hr Intravenous Continuous Minna Merritts, MD 33.3 mL/hr at 12/16/15 1331 60 mg/hr at 12/16/15 1331  . aspirin EC tablet 325 mg  325 mg Oral Daily Harrie Foreman, MD  325 mg at 12/16/15 1030  . benzonatate (TESSALON) capsule 200 mg  200 mg Oral TID Harrie Foreman, MD   200 mg at 12/16/15 1030  . bicalutamide (CASODEX) tablet 50 mg  50 mg Oral Daily Harrie Foreman, MD   50 mg at 12/16/15 1031  . cefTRIAXone (ROCEPHIN) 1 g in dextrose 5 % 50 mL IVPB  1 g Intravenous Q24H Vishal Mungal, MD   1 g at 12/16/15 1030  . clobetasol ointment (TEMOVATE) [id number] % 1 application  1 application Topical BID Harrie Foreman, MD   1 application at 0000000 1033  . docusate sodium (COLACE) capsule 100 mg  100 mg Oral BID Harrie Foreman, MD   100 mg at 12/16/15 1031  .  donepezil (ARICEPT) tablet 10 mg  10 mg Oral Daily Harrie Foreman, MD   10 mg at 12/16/15 1030  . escitalopram (LEXAPRO) tablet 5 mg  5 mg Oral Daily Harrie Foreman, MD   5 mg at 12/16/15 1031  . finasteride (PROSCAR) tablet 5 mg  5 mg Oral Daily Harrie Foreman, MD   5 mg at 12/16/15 1031  . hydrOXYzine (ATARAX/VISTARIL) tablet 25 mg  25 mg Oral Q6H PRN Harrie Foreman, MD      . levothyroxine (SYNTHROID, LEVOTHROID) tablet 50 mcg  50 mcg Oral QAC breakfast Harrie Foreman, MD   50 mcg at 12/16/15 0602  . morphine 2 MG/ML injection 1 mg  1 mg Intravenous Q3H PRN Harrie Foreman, MD      . niacin tablet 500 mg  500 mg Oral Daily Harrie Foreman, MD   500 mg at 12/16/15 1247  . ondansetron (ZOFRAN) tablet 4 mg  4 mg Oral Q6H PRN Harrie Foreman, MD       Or  . ondansetron [facility]) injection 4 mg  4 mg Intravenous Q6H PRN Harrie Foreman, MD   4 mg at 12/14/15 1303  . rosuvastatin (CRESTOR) tablet 5 mg  5 mg Oral q1800 Gladstone Lighter, MD   5 mg at 12/15/15 1737  . sodium chloride flush (NS) 0.9 % injection 3 mL  3 mL Intravenous Q12H Harrie Foreman, MD   3 mL at 12/16/15 1033  . tamsulosin (FLOMAX) capsule 0.4 mg  0.4 mg Oral QPC supper Harrie Foreman, MD   0.4 mg at 12/15/15 1737  . zolpidem (AMBIEN) tablet 2.5 mg  2.5 mg Oral QHS PRN Harrie Foreman, MD   2.5 mg at 12/16/15 0157     Discharge Medications: Please see discharge summary for a list of discharge medications.  Relevant Imaging Results:  Relevant Lab Results:   Additional Information ssn [place], [place], [place]

## 2015-12-16 NOTE — Progress Notes (Signed)
Patient: Adam Nielsen / Admit Date: 12/13/2015 / Date of Encounter: 12/16/2015, 7:47 AM   Subjective: Pleasantly confused this morning, Denies any complaints, no chest pain or shortness of breath, Appears to be resting comfortably,  review of telemetry show he converted from atrial flutter to atrial fibrillation approximately 24 hours ago, Rare episodes of short nonsustained VT in the past 24 hours -He is still on amiodarone infusion   Review of Systems: Review of Systems  Unable to perform ROS  secondary to dementia Overall he denies any complaints   Objective: Telemetry:Atrial fibrillation rate 80-90  Physical Exam: Blood pressure 113/74, pulse 102, temperature 97.9 F (36.6 C), temperature source Oral, resp. rate 16, height 5\' 7"  (1.702 m), weight 172 lb 13.5 oz (78.4 kg), SpO2 94 %. Body mass index is 27.06 kg/(m^2). General: Well developed, well nourished, in no acute distress. Head: Normocephalic, atraumatic, sclera non-icteric, no xanthomas, nares are without discharge. Neck: Negative for carotid bruits. JVP not elevated. Lungs: Clear bilaterally to auscultation without wheezes, rales, or rhonchi. Breathing is unlabored. Heart:  IRRR,  without murmurs, rubs, or gallops.  Abdomen: Soft, non-tender, non-distended with normoactive bowel sounds. No rebound/guarding. Extremities: No clubbing or cyanosis. No edema. Distal pedal pulses are 2+ and equal bilaterally. Neuro:Sleepy this morning,  Moves all extremities spontaneously. Psych:  Responds to questions, confusion   Intake/Output Summary (Last 24 hours) at 12/16/15 0747 Last data filed at 12/16/15 0600  Gross per 24 hour  Intake 2385.6 ml  Output    700 ml  Net 1685.6 ml    Inpatient Medications:  . aspirin EC  325 mg Oral Daily  . benzonatate  200 mg Oral TID  . bicalutamide  50 mg Oral Daily  . cefTRIAXone (ROCEPHIN)  IV  1 g Intravenous Q24H  . clobetasol ointment  1 application Topical BID  . docusate  sodium  100 mg Oral BID  . donepezil  10 mg Oral Daily  . escitalopram  5 mg Oral Daily  . finasteride  5 mg Oral Daily  . levothyroxine  50 mcg Oral QAC breakfast  . niacin  500 mg Oral Daily  . rosuvastatin  5 mg Oral q1800  . sodium chloride flush  3 mL Intravenous Q12H  . tamsulosin  0.4 mg Oral QPC supper   Infusions:  . sodium chloride 75 mL/hr at 12/15/15 1920  . amiodarone 60 mg/hr (12/16/15 0643)  . norepinephrine Stopped (12/14/15 1315)    Labs:  Recent Labs  12/15/15 0321 12/16/15 0543  NA 132* 134*  K 4.0 3.8  CL 109 111  CO2 18* 16*  GLUCOSE 163* 166*  BUN 20 22*  CREATININE 1.22 1.24  CALCIUM 7.8* 7.8*    Recent Labs  12/14/15 0005  AST 37  ALT 13*  ALKPHOS 52  BILITOT 0.5  PROT 6.6  ALBUMIN 3.9    Recent Labs  12/15/15 0321 12/15/15 1316 12/16/15 0543  WBC 9.8  --  9.0  HGB 11.0* 11.0* 10.8*  HCT 32.2*  --  31.8*  MCV 92.9  --  94.2  PLT 167  --  149*    Recent Labs  12/14/15 0005 12/14/15 0416 12/14/15 1142 12/14/15 1547  CKTOTAL 408*  --   --   --   TROPONINI 4.16* 4.80* 3.96* 3.70*   Invalid input(s): POCBNP  Recent Labs  12/14/15 0605  HGBA1C 6.3*     Weights: Filed Weights   12/14/15 1400 12/15/15 0426 12/16/15 0410  Weight: 165  lb 2 oz (74.9 kg) 169 lb 8.5 oz (76.9 kg) 172 lb 13.5 oz (78.4 kg)     Radiology/Studies:  Dg Chest 2 View  11/22/2015  CLINICAL DATA:  Cough and chest pain EXAM: CHEST  2 VIEW COMPARISON:  12/23/2014 FINDINGS: Postop CABG. Negative for heart failure. Lungs are clear without infiltrate effusion or mass. Unchanged from the prior study. IMPRESSION: No active cardiopulmonary disease. Electronically Signed   By: Franchot Gallo M.D.   On: 11/22/2015 21:17   Ct Head Wo Contrast  11/22/2015  CLINICAL DATA:  Fall x3, possible UTI EXAM: CT HEAD WITHOUT CONTRAST TECHNIQUE: Contiguous axial images were obtained from the base of the skull through the vertex without intravenous contrast. COMPARISON:   12/23/2014 FINDINGS: No evidence of parenchymal hemorrhage or extra-axial fluid collection. No mass lesion, mass effect, or midline shift. No CT evidence of acute infarction. Extensive subcortical white matter and periventricular small vessel ischemic changes. Intracranial atherosclerosis. Global cortical and central atrophy.  No ventriculomegaly. Partial opacification of the bilateral ethmoid sinuses. Mucosal thickening with layering fluid in the bilateral maxillary sinuses. Mastoid air cells are clear. Old right lamina papyracea fracture. No evidence of calvarial fracture. IMPRESSION: No evidence of acute intracranial abnormality. Atrophy with small vessel ischemic changes. Electronically Signed   By: Julian Hy M.D.   On: 11/22/2015 21:03     Assessment and Plan  82 y.o. male  Patient has underlying dementia, poor historian history of coronary disease, CABG  TURP 12/12/15  recent fall  night, details unclear was found down Brought into the hospital, troponin of 4, significant blood in his urine on heparin (stopped) Presented with normal sinus rhythm, converted to atrial fibrillation while in the emergency room Started on amiodarone infusion  systolic pressures down to 70s in ER Echocardiogram  showing regions of wall motion abn, depressed ejection fraction 35%. No prior baseline available.  Challenging case given non-STEMI,  atrial fibrillation, need for anticoagulation in the setting of recent TURP and hematuria   -----NSTEMI Initial plan was potentially to take him to the cardiac catheterization lab given elevated troponin though given his hemodynamic instability/sepsis picture, gross hematuria in the ER, this has been delayed. -There is wall motion abnormality on echocardiogram, no recent ischemia workup available. Most recent troponin trending downward For now will manage medically, watch blood count, blood pressure. -Blood pressure more stable though still borderline low -We'll  hold off on adding beta blockers given low blood pressure  Continue aspirin and statin ------ will discuss with medical team, potentially could start heparin infusion with no bolus as he is in atrial fibrillation. Would watch urine for hematuria. If stable over the weekend on heparin, potentially could schedule for cardiac catheterization on Monday knowing that he would be able to tolerate antiplatelet therapy if stent is required  --- Hypotension Sepsis picture on arrival, had significant IV fluid resuscitation with minimal improvement of his pressures, Blood pressure has since improved with antibiotics, possible UTI  --CAD, s/p CABG Unable to obtain previous records   no recent ischemia workup for family Will likely need some type of ischemia workup prior to discharge if situation permits We'll need to discuss with family whether they would like cardiac catheterization early next week, if able to tolerate heparin without hematuria   --PAF: He presented with normal sinus rhythm, converted to atrial fibrillation in the emergency room, later converted to atrial flutter  --Approximately 24 hours ago converted to atrial fibrillation  He did have atrial fibrillation November 22 2015. Currently not on a coagulation though we could start heparin infusion as a test ,  given his recent hematuria  Will aim rate control as he has not been on anticoagulation   he is on amiodarone, as blood pressure has been very low, unable to use diltiazem or metoprolol  --Hematuria Recent TURP 2 days ago, Heparin infusion on hold, cath on hold  it has been 2 days since his arrival, potentially could try heparin infusion with no bolus --   Total encounter time more than 35 minutes  Greater than 50% was spent in counseling and coordination of care with the patient   Signed, Esmond Plants, MD, Ph.D. [facility] 12/16/2015, 7:47 AM

## 2015-12-16 NOTE — Care Management Important Message (Signed)
Important Message  Patient Details  Name: Adam Nielsen MRN: [id number] Date of Birth: 1925/01/02   Medicare Important Message Given:  Yes  Explained to son at bedside    Katrina Stack, RN 12/16/2015, 12:19 PM

## 2015-12-16 NOTE — Progress Notes (Signed)
Notified Dr. Rockey Situ that patient has converted to NSR with PACs.  Per MD- keep Amio drip infusing at current rate at 33.8ml/hr- will discuss anticoagaultion tomorrow.

## 2015-12-16 NOTE — Progress Notes (Signed)
Clarification LCSW in discovery patient will return to the ALF- [facility] . Quantavius Humm LCSW

## 2015-12-16 NOTE — Clinical Social Work Note (Signed)
Clinical Social Work Assessment  Patient Details  Name: Adam Nielsen MRN: [id number] Date of Birth: [birth year]  Date of referral:  12/16/15               Reason for consult:  Family Concerns, Facility Placement                Permission sought to share information with:  Facility Sport and exercise psychologist, Family Supports Permission granted to share information::  Yes, Release of Information Signed  Name::     Ovidio Hanger [phone]  Daughter [phone]  Agency::  [facility]  Relationship::  yes  Contact Information:  yes  Housing/Transportation Living arrangements for the past 2 months:  [place] of Information:  Adult Children Patient Interpreter Needed:  None Criminal Activity/Legal Involvement Pertinent to Current Situation/Hospitalization:  No - Comment as needed Significant Relationships:  Adult Children Lives with:  Facility Resident Do you feel safe going back to the place where you live?  Yes Need for family participation in patient care:  Yes (Comment)  Care giving concerns:  Son expressed that patient is very happy to return to [facility] and got a new room assigned.   Social Worker assessment / plan:  LCSW and patient were introduced and patient son was present during our interview to collect data for his assessment. Patient is insured with managed medicare and resides happily at [facility]. On Monday he was discharged from [facility] and now has chronic foley in place Patient uses a walker and is able to bring himself to bathroom as needed. He is able to feed himself but does require some assistance with showering and dressing/ He is currently on 2 litres of 02 by [place], Patient reports his appetite is not the same- really is not hungry. Patient is oriented to person, place and situation and was able to tell me his birthday. Patient gave verbal consent to contact his family ( son and daughter) and his current residential facility. No placement concerns as family  would like him to return.   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 They were concerned when he was put back in the hospital and bit worried about heart issues.   Emotional Assessment Appearance:  Appears stated age Attitude/Demeanor/Rapport:   (Pleasant and cooperative) Affect (typically observed):  Accepting, Adaptable, Calm Orientation:  Oriented to Self, Oriented to Place, Oriented to  Time, Oriented to Situation Alcohol / Substance use:  Never Used Psych involvement (Current and /or in the community):  No (Comment)  Discharge Needs  Concerns to be addressed:  Adjustment to Illness Readmission within the last 30 days:  Yes Current discharge risk:  None Barriers to Discharge:  Continued Medical Work up   Biehle, Brandon, LCSW 12/16/2015, 1:35 PM

## 2015-12-17 DIAGNOSIS — I950 Idiopathic hypotension: Secondary | ICD-10-CM

## 2015-12-17 LAB — PROTIME-INR
INR: 1.27
Prothrombin Time: 16 seconds — ABNORMAL HIGH (ref 11.4–15.0)

## 2015-12-17 LAB — APTT: APTT: 38 s — AB (ref 24–36)

## 2015-12-17 LAB — URINE CULTURE
CULTURE: NO GROWTH
Special Requests: NORMAL

## 2015-12-17 LAB — HEPARIN LEVEL (UNFRACTIONATED): HEPARIN UNFRACTIONATED: 0.26 [IU]/mL — AB (ref 0.30–0.70)

## 2015-12-17 MED ORDER — HEPARIN BOLUS VIA INFUSION
1200.0000 [IU] | Freq: Once | INTRAVENOUS | Status: AC
  Administered 2015-12-17: 1200 [IU] via INTRAVENOUS
  Filled 2015-12-17: qty 1200

## 2015-12-17 MED ORDER — HEPARIN (PORCINE) IN NACL 100-0.45 UNIT/ML-% IJ SOLN
1150.0000 [IU]/h | INTRAMUSCULAR | Status: DC
  Administered 2015-12-17: 1000 [IU]/h via INTRAVENOUS
  Administered 2015-12-18: 1150 [IU]/h via INTRAVENOUS
  Filled 2015-12-17 (×5): qty 250

## 2015-12-17 MED ORDER — METOPROLOL TARTRATE 25 MG PO TABS
12.5000 mg | ORAL_TABLET | Freq: Two times a day (BID) | ORAL | Status: DC
  Administered 2015-12-17 – 2015-12-21 (×9): 12.5 mg via ORAL
  Filled 2015-12-17 (×9): qty 1

## 2015-12-17 NOTE — Progress Notes (Signed)
Patient: Adam Nielsen / Admit Date: 12/13/2015 / Date of Encounter: 12/17/2015, 12:42 PM   Subjective: Pleasantly still  confused this morning Cannot really give ROS. Denies any complaints, no chest pain or shortness of breath, Appears to be resting comfortably,   Tele: shows atrial fibrillation approximately - rates in 80s-120s (on Amiodarone). Rare episodes of short nonsustained VT - no further episodes  -He is still on amiodarone infusion   Review of Systems: Review of Systems  Unable to perform ROS  secondary to dementia Overall he denies any complaints   Objective: Telemetry:Atrial fibrillation rate 80-90  Physical Exam: Blood pressure 131/75, pulse 95, temperature 97.5 F (36.4 C), temperature source Oral, resp. rate 18, height 5\' 7"  (1.702 m), weight 177 lb 1.6 oz (80.332 kg), SpO2 93 %. Body mass index is 27.73 kg/(m^2). General: Well developed, well nourished, in no acute distress. - confused Head: Normocephalic, atraumatic, sclera non-icteric, no xanthomas, nares are without discharge.  Keeps R eye closed Neck: Negative for carotid bruits. JVP not elevated. Lungs: Clear bilaterally to auscultation without wheezes, rales, or rhonchi. Breathing is unlabored. Heart:  IRRR,  2/6 HSM @ RUSB, rubs, or gallops.  Abdomen: Soft, non-tender, non-distended with normoactive bowel sounds. No rebound/guarding. Extremities: No clubbing or cyanosis. No edema. Distal pedal pulses are 2+ and equal bilaterally. Neuro:Sleepy this morning,  Moves all extremities spontaneously. Psych:  Responds to questions, confusion   Intake/Output Summary (Last 24 hours) at 12/17/15 1242 Last data filed at 12/17/15 1027  Gross per 24 hour  Intake     60 ml  Output    500 ml  Net   -440 ml    Inpatient Medications:  . antiseptic oral rinse  7 mL Mouth Rinse BID  . aspirin EC  325 mg Oral Daily  . benzonatate  200 mg Oral TID  . bicalutamide  50 mg Oral Daily  . cefTRIAXone (ROCEPHIN)  IV   1 g Intravenous Q24H  . clobetasol ointment  1 application Topical BID  . docusate sodium  100 mg Oral BID  . donepezil  10 mg Oral Daily  . escitalopram  5 mg Oral Daily  . finasteride  5 mg Oral Daily  . levothyroxine  50 mcg Oral QAC breakfast  . metoprolol tartrate  12.5 mg Oral BID  . niacin  500 mg Oral Daily  . rosuvastatin  5 mg Oral q1800  . sodium chloride flush  3 mL Intravenous Q12H  . tamsulosin  0.4 mg Oral QPC supper   Infusions:  . sodium chloride 75 mL/hr at 12/17/15 0847  . amiodarone 60 mg/hr (12/17/15 0915)  . heparin      Labs:  Recent Labs  12/15/15 0321 12/16/15 0543  NA 132* 134*  K 4.0 3.8  CL 109 111  CO2 18* 16*  GLUCOSE 163* 166*  BUN 20 22*  CREATININE 1.22 1.24  CALCIUM 7.8* 7.8*   No results for input(s): AST, ALT, ALKPHOS, BILITOT, PROT, ALBUMIN in the last 72 hours.  Recent Labs  12/15/15 0321 12/15/15 1316 12/16/15 0543  WBC 9.8  --  9.0  HGB 11.0* 11.0* 10.8*  HCT 32.2*  --  31.8*  MCV 92.9  --  94.2  PLT 167  --  149*    Recent Labs  12/14/15 1547  TROPONINI 3.70*   Invalid input(s): POCBNP No results for input(s): HGBA1C in the last 72 hours.   Weights: [employer]   12/15/15 [id number] 12/16/15 0410 12/17/15 [id number]  Weight: 169 lb 8.5 oz (76.9 kg) 172 lb 13.5 oz (78.4 kg) 177 lb 1.6 oz (80.332 kg)     Radiology/Studies:  No new study   Assessment and Plan  82 y.o. male  Patient has underlying dementia, poor historian history of coronary disease, CABG - no records available (no recent ischemic w/u)  TURP 12/12/15  recent fall  night, details unclear was found down Brought into the hospital, troponin of 4, significant blood in his urine on heparin (stopped) Presented with normal sinus rhythm, converted to atrial fibrillation while in the emergency room Started on amiodarone infusion - can converted to lower dose infusion  systolic pressures down to 70s in ER Echocardiogram  showing regions of wall motion abn,  depressed ejection fraction 35%. No prior baseline available.  Challenging case given non-STEMI,  atrial fibrillation, need for anticoagulation in the setting of recent TURP and hematuria    -----NSTEMI Initial plan was potentially to take him to the cardiac catheterization lab given elevated troponin though given his hemodynamic instability/sepsis picture, gross hematuria in the ER, this has been delayed. -There is wall motion abnormality on echocardiogram, no recent ischemia workup available. Most recent troponin trending downward For now will manage medically, watch blood count, blood pressure. Given his underlying dementia, and recent TURP with need to be on anticoagulation for atrial fibrillation, my inclination this time would be to opt more for medical management and not pursue an invasive strategy in the absence of symptoms. Preference would be to try to get CABG report as well as prior evaluations. I'm not sure how much she would benefit from invasive evaluation. -Blood pressure more stable though still borderline low -Blood pressure now more stable, we can start low-dose beta blocker which will allow [place] to reduce dose of amiodarone  Continue aspirin and statin ------ plan to start heparin infusion with no bolus as he is in atrial fibrillation. Would watch urine for hematuria. If stable over the weekend on heparin, would have more data to discuss the consideration of possible cardiac catheterization on Monday knowing that he would be able to tolerate antiplatelet therapy if stent is required -- I think this requires a discussion with the patient's son  --- Hypotension - resolved Sepsis picture on arrival, had significant IV fluid resuscitation with minimal improvement of his pressures, Blood pressure has since improved with antibiotics, possible UTI  --CAD, s/p CABG Unable to obtain previous records   no recent ischemia workup for family Will likely need some type of ischemia workup  prior to discharge if situation permits - at this stage, my consideration would be to pursue noninvasive evaluation as opposed invasive.  We'll need to discuss with family whether they would like cardiac catheterization early next week, if able to tolerate heparin without hematuria   --PAF: He presented with normal sinus rhythm, converted to atrial fibrillation in the emergency room, later converted to atrial flutter - now back in atrial fibrillation with borderline rate control  He did have atrial fibrillation November 22 2015.  Currently not on a coagulation - plan to start heparin infusion as a test ,  given his recent hematuria   Will aim rate control as he has not been on anticoagulation    he is on amiodarone, as blood pressure has been very low - will add low-dose beta blocker, and reduced drip to reduced rate.  --Hematuria Recent TURP 2 days ago,  Heparin infusion now started. Monitor for recurrent bleeding. If bleeding occurs, stop heparin.  For now would continue to hold on thoughts of catheterization to see how he tolerates infusion.    Total encounter time more than 42minutes  Greater than 50% was spent in counseling and coordination of care with the patient   Signed, Esmond Plants, MD, Ph.D. [facility] 12/17/2015, 12:42 PM

## 2015-12-17 NOTE — Progress Notes (Signed)
Noted Amiodarone stop  time at 8 pm, the pharmacist notified and she stated the order was put in to stop at 8 pm today. Dr. Ether Griffins notifeid and she indicated she was going to consult  the pharmacist to resume the drip. The drip is now resumed to be running at 16.7 mL/hrour. Will continue to monitor.

## 2015-12-17 NOTE — Progress Notes (Signed)
ANTICOAGULATION CONSULT NOTE - Initial Consult  Pharmacy Consult for Heparin Drip Indication: atrial fibrillation  No Known Allergies  Patient Measurements: Height: 5\' 7"  (170.2 cm) Weight: 177 lb 1.6 oz (80.332 kg) IBW/kg (Calculated) : 66.1 Heparin Dosing Weight: 80 kg  Vital Signs: Temp: 97.5 F (36.4 C) (04/01 1114) Temp Source: Oral (04/01 1114) BP: 131/75 mmHg (04/01 1114) Pulse Rate: 95 (04/01 1114)  Labs:  Recent Labs  12/14/15 1547  12/15/15 0321 12/15/15 1316 12/16/15 0543  HGB  --   < > 11.0* 11.0* 10.8*  HCT  --   --  32.2*  --  31.8*  PLT  --   --  167  --  149*  CREATININE  --   --  1.22  --  1.24  TROPONINI 3.70*  --   --   --   --   < > = values in this interval not displayed.  Estimated Creatinine Clearance: 41 mL/min (by C-G formula based on Cr of 1.24).   Medical History: Past Medical History  Diagnosis Date  . PAF (paroxysmal atrial fibrillation) (HCC)     a. not on long term full dose anticoagulation  . Hypothyroid   . Prostate CA ([place])   . Dementia   . Acute bronchitis 11/22/2015   . Chronic indwelling Foley catheter   . BPH (benign prostatic hypertrophy)   . Hyperlipidemia   . Urinary tract infection 11/22/2015   . Fall     11/22/2015    . CAD (coronary artery disease)     a. s/p CABG approx 1997; b. patient of Dr. Wynonia Lawman, MD    Medications:  Scheduled:  . antiseptic oral rinse  7 mL Mouth Rinse BID  . aspirin EC  325 mg Oral Daily  . benzonatate  200 mg Oral TID  . bicalutamide  50 mg Oral Daily  . cefTRIAXone (ROCEPHIN)  IV  1 g Intravenous Q24H  . clobetasol ointment  1 application Topical BID  . docusate sodium  100 mg Oral BID  . donepezil  10 mg Oral Daily  . escitalopram  5 mg Oral Daily  . finasteride  5 mg Oral Daily  . levothyroxine  50 mcg Oral QAC breakfast  . metoprolol tartrate  12.5 mg Oral BID  . niacin  500 mg Oral Daily  . rosuvastatin  5 mg Oral q1800  . sodium chloride flush  3 mL Intravenous Q12H  .  tamsulosin  0.4 mg Oral QPC supper   Infusions:  . amiodarone 30 mg/hr (12/17/15 1242)  . heparin 1,000 Units/hr (12/17/15 1247)    Assessment: Pharmacy consulted to initiate and titrate heparin drip in an 82 yo male with atrial fibrillation.  Patient previously on heparin drip at 1000 units/hr with therapeutic HL. Per MD, no bolus.  Baseline aPTT, INR pending  Goal of Therapy:  Heparin level 0.3-0.7 units/ml Monitor platelets by anticoagulation protocol: Yes   Plan:  Start heparin infusion at 1000 units/hr Check anti-Xa level in 8 hours and daily while on heparin Continue to monitor H&H and platelets  Pharmacy will continue to follow.  Alric Geise G 12/17/2015,1:21 PM

## 2015-12-17 NOTE — Progress Notes (Signed)
ANTICOAGULATION CONSULT NOTE - Initial Consult  Pharmacy Consult for Heparin Drip Indication: atrial fibrillation  No Known Allergies  Patient Measurements: Height: 5\' 7"  (170.2 cm) Weight: 177 lb 1.6 oz (80.332 kg) IBW/kg (Calculated) : 66.1 Heparin Dosing Weight: 80 kg  Vital Signs: Temp: 98.2 F (36.8 C) (04/01 1959) Temp Source: Oral (04/01 1959) BP: 100/70 mmHg (04/01 1959) Pulse Rate: 59 (04/01 1959)  Labs:  Recent Labs  12/15/15 0321 12/15/15 1316 12/16/15 0543 12/17/15 1256 12/17/15 2137  HGB 11.0* 11.0* 10.8*  --   --   HCT 32.2*  --  31.8*  --   --   PLT 167  --  149*  --   --   APTT  --   --   --  38*  --   LABPROT  --   --   --  16.0*  --   INR  --   --   --  1.27  --   HEPARINUNFRC  --   --   --   --  0.26*  CREATININE 1.22  --  1.24  --   --     Estimated Creatinine Clearance: 41 mL/min (by C-G formula based on Cr of 1.24).   Medical History: Past Medical History  Diagnosis Date  . PAF (paroxysmal atrial fibrillation) (HCC)     a. not on long term full dose anticoagulation  . Hypothyroid   . Prostate CA ([place])   . Dementia   . Acute bronchitis 11/22/2015   . Chronic indwelling Foley catheter   . BPH (benign prostatic hypertrophy)   . Hyperlipidemia   . Urinary tract infection 11/22/2015   . Fall     11/22/2015    . CAD (coronary artery disease)     a. s/p CABG approx 1997; b. patient of Dr. Wynonia Lawman, MD    Medications:  Scheduled:  . antiseptic oral rinse  7 mL Mouth Rinse BID  . aspirin EC  325 mg Oral Daily  . benzonatate  200 mg Oral TID  . bicalutamide  50 mg Oral Daily  . cefTRIAXone (ROCEPHIN)  IV  1 g Intravenous Q24H  . clobetasol ointment  1 application Topical BID  . docusate sodium  100 mg Oral BID  . donepezil  10 mg Oral Daily  . escitalopram  5 mg Oral Daily  . finasteride  5 mg Oral Daily  . heparin  1,200 Units Intravenous Once  . levothyroxine  50 mcg Oral QAC breakfast  . metoprolol tartrate  12.5 mg Oral BID  . niacin   500 mg Oral Daily  . rosuvastatin  5 mg Oral q1800  . sodium chloride flush  3 mL Intravenous Q12H  . tamsulosin  0.4 mg Oral QPC supper   Infusions:  . amiodarone 30 mg/hr (12/17/15 1807)  . heparin 1,000 Units/hr (12/17/15 1247)    Assessment: Pharmacy consulted to initiate and titrate heparin drip in an 82 yo male with atrial fibrillation.  Patient previously on heparin drip at 1000 units/hr with therapeutic HL. Per MD, no bolus.  Baseline aPTT, INR pending  Goal of Therapy:  Heparin level 0.3-0.7 units/ml Monitor platelets by anticoagulation protocol: Yes   Plan:  Heparin level subtherapeutic. 1200 units IV x 1 bolus and increase rate to 1150 units/hr. Will recheck level in 8 hours.  Pharmacy will continue to follow.  Laural Benes, Pharm.D., BCPS Clinical Pharmacist 12/17/2015,10:12 PM

## 2015-12-18 DIAGNOSIS — L8990 Pressure ulcer of unspecified site, unspecified stage: Secondary | ICD-10-CM | POA: Insufficient documentation

## 2015-12-18 LAB — CBC
HCT: 31.4 % — ABNORMAL LOW (ref 40.0–52.0)
HEMOGLOBIN: 10.7 g/dL — AB (ref 13.0–18.0)
MCH: 31.6 pg (ref 26.0–34.0)
MCHC: 34 g/dL (ref 32.0–36.0)
MCV: 93 fL (ref 80.0–100.0)
PLATELETS: 153 10*3/uL (ref 150–440)
RBC: 3.37 MIL/uL — ABNORMAL LOW (ref 4.40–5.90)
RDW: 15.2 % — ABNORMAL HIGH (ref 11.5–14.5)
WBC: 10.2 10*3/uL (ref 3.8–10.6)

## 2015-12-18 LAB — HEPARIN LEVEL (UNFRACTIONATED)
HEPARIN UNFRACTIONATED: 0.36 [IU]/mL (ref 0.30–0.70)
Heparin Unfractionated: 0.36 IU/mL (ref 0.30–0.70)

## 2015-12-18 MED ORDER — FUROSEMIDE 40 MG PO TABS
40.0000 mg | ORAL_TABLET | Freq: Every day | ORAL | Status: DC
  Administered 2015-12-18 – 2015-12-21 (×4): 40 mg via ORAL
  Filled 2015-12-18 (×4): qty 1

## 2015-12-18 MED ORDER — AMIODARONE HCL 200 MG PO TABS
400.0000 mg | ORAL_TABLET | Freq: Two times a day (BID) | ORAL | Status: DC
  Administered 2015-12-18 – 2015-12-21 (×7): 400 mg via ORAL
  Filled 2015-12-18 (×7): qty 2

## 2015-12-18 NOTE — Progress Notes (Signed)
Patient: Adam Nielsen / Admit Date: 12/13/2015 / Date of Encounter: 12/18/2015, 1:27 PM   Subjective: Pleasantly still  confused this morning Cannot really give ROS. Denies any complaints, no chest pain or shortness of breath, Appears to be resting comfortably,   Tele: shows atrial fibrillation approximately - rates in 80s-120s (on Amiodarone). Rare episodes of short nonsustained VT - no further episodes  -He is still on amiodarone infusion  Review of Systems: Review of Systems  Unable to perform ROS  secondary to dementia Overall he denies any complaints   Objective: Telemetry:Atrial fibrillation rate 80-90  Physical Exam: Blood pressure 120/67, pulse 93, temperature 98.4 F (36.9 C), temperature source Oral, resp. rate 22, height 5\' 7"  (1.702 m), weight 176 lb 3.2 oz (79.924 kg), SpO2 94 %. Body mass index is 27.59 kg/(m^2). General: Well developed, well nourished, in no acute distress. - confused; resting comfortably when I arrived. Head: Normocephalic, atraumatic, sclera non-icteric, no xanthomas, nares are without discharge.  Keeps R eye closed Neck: Negative for carotid bruits. JVP not elevated. Lungs: Clear bilaterally to auscultation mild basal rales. Breathing is unlabored. Heart:  IRRR,  2/6 HSM @ RUSB, rubs, or gallops.  Abdomen: Soft, non-tender, non-distended with normoactive bowel sounds. No rebound/guarding. Extremities: No clubbing or cyanosis. No edema. Distal pedal pulses are 2+ and equal bilaterally. Neuro:Sleepy this morning,  Moves all extremities spontaneously. Psych:  Responds to questions, confusion   Intake/Output Summary (Last 24 hours) at 12/18/15 1327 Last data filed at 12/18/15 1244  Gross per 24 hour  Intake 1151.78 ml  Output    900 ml  Net 251.78 ml    Inpatient Medications:  . amiodarone  400 mg Oral BID  . antiseptic oral rinse  7 mL Mouth Rinse BID  . aspirin EC  325 mg Oral Daily  . benzonatate  200 mg Oral TID  . bicalutamide   50 mg Oral Daily  . cefTRIAXone (ROCEPHIN)  IV  1 g Intravenous Q24H  . clobetasol ointment  1 application Topical BID  . docusate sodium  100 mg Oral BID  . donepezil  10 mg Oral Daily  . escitalopram  5 mg Oral Daily  . finasteride  5 mg Oral Daily  . furosemide  40 mg Oral Daily  . levothyroxine  50 mcg Oral QAC breakfast  . metoprolol tartrate  12.5 mg Oral BID  . niacin  500 mg Oral Daily  . rosuvastatin  5 mg Oral q1800  . sodium chloride flush  3 mL Intravenous Q12H  . tamsulosin  0.4 mg Oral QPC supper   Infusions:  . heparin 1,150 Units/hr (12/18/15 1116)    Labs:  Recent Labs  12/16/15 0543  NA 134*  K 3.8  CL 111  CO2 16*  GLUCOSE 166*  BUN 22*  CREATININE 1.24  CALCIUM 7.8*   No results for input(s): AST, ALT, ALKPHOS, BILITOT, PROT, ALBUMIN in the last 72 hours.  Recent Labs  12/16/15 0543 12/18/15 0702  WBC 9.0 10.2  HGB 10.8* 10.7*  HCT 31.8* 31.4*  MCV 94.2 93.0  PLT 149* 153   No results for input(s): CKTOTAL, CKMB, TROPONINI in the last 72 hours. Invalid input(s): POCBNP No results for input(s): HGBA1C in the last 72 hours.   Weights: Filed Weights   12/16/15 0410 12/17/15 0634 12/18/15 0500  Weight: 172 lb 13.5 oz (78.4 kg) 177 lb 1.6 oz (80.332 kg) 176 lb 3.2 oz (79.924 kg)     Radiology/Studies:  No new study   Assessment and Plan  82 y.o. male  Patient has underlying dementia, poor historian history of coronary disease, CABG - no records available (no recent ischemic w/u)  TURP 12/12/15  recent fall  night, details unclear was found down Brought into the hospital, troponin of 4, significant blood in his urine on heparin (stopped) Presented with normal sinus rhythm, converted to atrial fibrillation while in the emergency room Started on amiodarone infusion - can converted to lower dose infusion  systolic pressures down to 70s in ER Echocardiogram  showing regions of wall motion abn, depressed ejection fraction 35%. No prior  baseline available.  Challenging case given non-STEMI,  atrial fibrillation, need for anticoagulation in the setting of recent TURP and hematuria    -----NSTEMI: Most recent troponin trending downward Initial plan was potentially to take him to the cardiac catheterization lab given elevated troponin though given his hemodynamic instability/sepsis picture, gross hematuria in the ER, this has been delayed. -There is wall motion abnormality on echocardiogram, no recent ischemia workup available. Unfortunately we do not have CABG reports or any description of his anatomy.  For now will manage medically, watch blood count, blood pressure. Given his underlying dementia, and recent TURP with need to be on anticoagulation for atrial fibrillation, my inclination this time would be to opt more for medical management and not pursue an invasive strategy in the absence of symptoms. Preference would be to try to get CABG report as well as prior evaluations. I'm not sure how much she would benefit from invasive evaluation.  The patient as well as his son and daughter both for a concern about considering cardiac catheterization. It is my understanding that they would probably prefer not to undergo invasive procedures which I think is reasonable.  I think considering a noninvasive evaluation is very reasonable given his level of dementia.   -Blood pressure more stable though still borderline low  -Blood pressure now more stable, tolerating low-dose beta blocker which will allow [place] to reduce dose of amiodarone   Continue aspirin and statin   ------ plan to start heparin infusion with no bolus as he is in atrial fibrillation. Would watch urine for hematuria. If stable over the weekend on heparin, would have more data to consider starting long-term anticoagulation for atrial fibrillation  --- Hypotension - resolved Sepsis picture on arrival, had significant IV fluid resuscitation with minimal improvement of  his pressures, Blood pressure has since improved with antibiotics, possible UTI  --CAD, s/p CABG Unable to obtain previous records   no recent ischemia workup for family Will likely need some type of ischemia workup prior to discharge if situation permits - at this stage, my consideration would be to pursue noninvasive evaluation as opposed invasive.  We'll need to discuss with family whether they would like cardiac catheterization early next week, if able to tolerate heparin without hematuria  --PAF: He presented with normal sinus rhythm, converted to atrial fibrillation in the emergency room, later converted to atrial flutter - now back in atrial fibrillation with borderline rate control  He did have atrial fibrillation November 22 2015.  Currently not on a coagulation -tolerating IV heparin now. No further hematuria. Can probably consider long-term coag ablation.  Will aim rate control as he has not been on anticoagulation   Convert from amiodarone drip to by mouth today.  --Hematuria Recent TURP 2 days ago,  Heparin infusion now started. Monitor for recurrent bleeding. If bleeding occurs, stop heparin.  For now  would continue to hold on thoughts of catheterization to see how he tolerates infusion.    Total encounter time more than 74minutes  Greater than 50% was spent in counseling and coordination of care with the patient and daughter   Signed, Esmond Plants, MD, Ph.D. [facility] 12/18/2015, 1:27 PM

## 2015-12-18 NOTE — Progress Notes (Addendum)
Urine bag shown to MD Ellyn Hack, no concerns at this time, urine is tea colored/amber, no frank blood  Start PO amiodarone and take amio drip down 30 minutes later.  Will reduce lasix to 20 mg tomorrow, 40 mg given today.  Heparin drip to stay up for now, will bridge patient to PO [facility]

## 2015-12-18 NOTE — Progress Notes (Signed)
Family instructed to sit patient up before PO intake

## 2015-12-18 NOTE — Progress Notes (Signed)
ANTICOAGULATION CONSULT NOTE - FOLLOW UP  Consult  Pharmacy Consult for Heparin Drip Indication: atrial fibrillation  No Known Allergies  Patient Measurements: Height: 5\' 7"  (170.2 cm) Weight: 176 lb 3.2 oz (79.924 kg) IBW/kg (Calculated) : 66.1 Heparin Dosing Weight: 80 kg  Vital Signs: Temp: 97.8 F (36.6 C) (04/02 0526) Temp Source: Oral (04/02 0526) BP: 124/69 mmHg (04/02 0526) Pulse Rate: 79 (04/02 0526)  Labs:  Recent Labs  12/16/15 0543 12/17/15 1256 12/17/15 2137 12/18/15 0702  HGB 10.8*  --   --  10.7*  HCT 31.8*  --   --  31.4*  PLT 149*  --   --  153  APTT  --  38*  --   --   LABPROT  --  16.0*  --   --   INR  --  1.27  --   --   HEPARINUNFRC  --   --  0.26* 0.36  CREATININE 1.24  --   --   --     Estimated Creatinine Clearance: 40.9 mL/min (by C-G formula based on Cr of 1.24).   Medical History: Past Medical History  Diagnosis Date  . PAF (paroxysmal atrial fibrillation) (HCC)     a. not on long term full dose anticoagulation  . Hypothyroid   . Prostate CA ([place])   . Dementia   . Acute bronchitis 11/22/2015   . Chronic indwelling Foley catheter   . BPH (benign prostatic hypertrophy)   . Hyperlipidemia   . Urinary tract infection 11/22/2015   . Fall     11/22/2015    . CAD (coronary artery disease)     a. s/p CABG approx 1997; b. patient of Dr. Wynonia Lawman, MD    Medications:  Scheduled:  . antiseptic oral rinse  7 mL Mouth Rinse BID  . aspirin EC  325 mg Oral Daily  . benzonatate  200 mg Oral TID  . bicalutamide  50 mg Oral Daily  . cefTRIAXone (ROCEPHIN)  IV  1 g Intravenous Q24H  . clobetasol ointment  1 application Topical BID  . docusate sodium  100 mg Oral BID  . donepezil  10 mg Oral Daily  . escitalopram  5 mg Oral Daily  . finasteride  5 mg Oral Daily  . levothyroxine  50 mcg Oral QAC breakfast  . metoprolol tartrate  12.5 mg Oral BID  . niacin  500 mg Oral Daily  . rosuvastatin  5 mg Oral q1800  . sodium chloride flush  3 mL  Intravenous Q12H  . tamsulosin  0.4 mg Oral QPC supper   Infusions:  . amiodarone 30 mg/hr (12/17/15 1807)  . heparin 1,150 Units/hr (12/17/15 2244)    Assessment: Pharmacy consulted to initiate and titrate heparin drip in an 82 yo male with atrial fibrillation.  Patient previously on heparin drip at 1000 units/hr with therapeutic HL. Per MD, no bolus.  Baseline aPTT, INR pending  Goal of Therapy:  Heparin level 0.3-0.7 units/ml Monitor platelets by anticoagulation protocol: Yes   Plan:  Heparin level resulted @ 0.38, which is therapeutic. Will order next heparin level to be drawn @ 15:00.   Pharmacy will continue to follow.  Heena Woodbury D, Pharm.D., BCPS Clinical Pharmacist 12/18/2015,7:42 AM

## 2015-12-18 NOTE — Progress Notes (Signed)
[place] [facility] [place] NAME: Adam Nielsen    MR#:  [id number]  DATE OF BIRTH:  [birth year]  SUBJECTIVE:  CHIEF COMPLAINT:   Chief Complaint  Patient presents with  . Fall  . Urinary Retention   - NSTEMI, cardiogenic shock, systolic BP stable now- not on pressors as maintaining MAP - off heparin drip due to hematuria, will need cath - planned for Monday - PVCs and NSVT - on amiodarone drip for afib - hematuria clearing- cardiology suggest to resume heparin drip without bolus and cath on Monday. - today on 12/18/15 after discussing with family, cardiologist came to conclusion of not to do cath- due to his old age and dementia. Stopped amio drip- switch to oral. Pt does not have any complains.  REVIEW OF SYSTEMS:  Review of Systems  Constitutional: Positive for malaise/fatigue. Negative for fever and chills.  HENT: Positive for hearing loss. Negative for ear discharge, ear pain and nosebleeds.   Eyes: Negative for blurred vision, double vision and photophobia.  Respiratory: Positive for shortness of breath. Negative for cough and wheezing.   Cardiovascular: Negative for chest pain, palpitations and leg swelling.  Gastrointestinal: Negative for nausea, vomiting, abdominal pain, diarrhea and constipation.  Genitourinary: Negative for dysuria and hematuria.  Musculoskeletal: Positive for back pain. Negative for myalgias.  Neurological: Negative for dizziness, sensory change, speech change, focal weakness, seizures and headaches.  Psychiatric/Behavioral: Negative for depression. The patient is not nervous/anxious.     DRUG ALLERGIES:  No Known Allergies  VITALS:  Blood pressure 120/67, pulse 93, temperature 98.4 F (36.9 C), temperature source Oral, resp. rate 22, height 5\' 7"  (1.702 m), weight 79.924 kg (176 lb 3.2 oz), SpO2 94 %.  PHYSICAL EXAMINATION:  Physical Exam  GENERAL:  82 y.o.-year-old elderly patient lying in the bed with  no acute distress.  EYES: Pupils equal, round, reactive to light and accommodation. No scleral icterus. Extraocular muscles intact. Pale conjunctiva HEENT: Head atraumatic, normocephalic. Oropharynx and nasopharynx clear.  NECK:  Supple, no jugular venous distention. No thyroid enlargement, no tenderness.  LUNGS: Normal breath sounds bilaterally, no wheezing, rales,rhonchi or crepitation. No use of accessory muscles of respiration. Decreased bibasilar breath sounds CARDIOVASCULAR: S1, S2 Rapid and irregular. 3/6 systolic murmur is present. ABDOMEN: Soft, nontender, nondistended. Bowel sounds present. No organomegaly or mass. Foley in place, light orange colour urine. EXTREMITIES: No pedal edema, cyanosis, or clubbing.  NEUROLOGIC: Cranial nerves II through XII are intact. Muscle strength 5/5 in all extremities. Able to move extremities in bed. Sensation intact. Gait not checked.  PSYCHIATRIC: The patient is alert and oriented x 2. Intermittent confusion noted- dementia at baseline SKIN: No obvious rash, lesion, or ulcer.    LABORATORY PANEL:   CBC  Recent Labs Lab 12/18/15 0702  WBC 10.2  HGB 10.7*  HCT 31.4*  PLT 153   ------------------------------------------------------------------------------------------------------------------  Chemistries   Recent Labs Lab 12/14/15 0005  12/16/15 0543  NA 130*  < > 134*  K 3.7  < > 3.8  CL 105  < > 111  CO2 18*  < > 16*  GLUCOSE 128*  < > 166*  BUN 21*  < > 22*  CREATININE 1.13  < > 1.24  CALCIUM 8.8*  < > 7.8*  AST 37  --   --   ALT 13*  --   --   ALKPHOS 52  --   --   BILITOT 0.5  --   --   < > =  values in this interval not displayed. ------------------------------------------------------------------------------------------------------------------  Cardiac Enzymes  Recent Labs Lab 12/14/15 1547  TROPONINI 3.70*    ------------------------------------------------------------------------------------------------------------------  RADIOLOGY:  No results found.  EKG:   Orders placed or performed during the hospital encounter of 12/13/15  . EKG 12-Lead  . EKG 12-Lead    ASSESSMENT AND PLAN:   82 year old male with past medical history significant for coronary artery disease status post CABG, paroxysmal atrial fibrillation not on anticoagulation, dementia, metastatic prostate cancer status post TURP 2 days ago, urinary retention and bladder outlet obstruction requiring indwelling Foley catheter presents to the hospital secondary to a fall.  #1 NSTEMI-elevated troponin, but steady now. -Could have had suffered an MI in the last couple of days. Currently denies any chest pain. -Heparin drip discontinued due to bleeding and hypotension. Appreciate cardiology consult. -Echocardiogram with EF 35% and wall motion abnormality, no baseline available, monitor on telemetry. -Add metoprolol, if blood pressure allows. Add statin -Aspirin started.- cardio was planning cath initially, but after discussing in detail with son and daughter- decided not to do cath and just monitor with conservative management.  #2 hypotension-cardiogenic shock likely -However due to recent instrumentation and urological procedure 2 days ago. Elevated white count and also lactic acid. -Blood cultures have been ordered. Check urine cultures. -Received 1 dose of vancomycin. Lactic acid improved. Antibiotics changed to Rocephin, ur cx negative. -no need for vasopressors. - now resumed metoprolol.  #3 Acute systolic CHF- hypotensive- fluid overloaded. -Likely ischemic cardiomyopathy. EF is 35%. -Not on diuretics. Monitor IV fluids to prevent pulmonary edema  #4 atrial fibrillation with rapid ventricular response-no history of paroxysmal atrial fibrillation. was with rapid ventricular rate. -Blood pressure was low normal, so oral  medications or IV Cardizem cannot be used. -Given amiodarone drip. Also PVCs -Cardiology help appreciated. -heparin drip has been discontinued due to hematuria and hypotension - now resumed heparin without bolus, as hematuria seems resolved. - switched to oral amio today, cont heparin for anticoagulation.  #5 BPH-status post TURP done 2 days before this admission at [facility] by urologist. -Continue Flomax. Has a Foley catheter now - also on casodex, proscar -Hematuria is clearing. Hemoglobin is stable. On antibiotics for UTI  #6 DVT prophylaxis-heparin drip discontinued due to hematuria and hypertension. -Continue to monitor. Teds and SCDs for now  Physical therapy consult. -Discussed with son who is healthcare power of attorney, at bedside on admission. - According to patient's wishes, he is a DO NOT RESUSCITATE.   All the records are reviewed and case discussed with Care Management/Social Workerr. Management plans discussed with the patient, family and they are in agreement.  CODE STATUS: DNR  TOTAL CRITICAL CARE TIME SPENT IN TAKING CARE OF THIS PATIENT: 35 minutes.   POSSIBLE D/C IN 3-4 DAYS, DEPENDING ON CLINICAL CONDITION. Spoke to his Daughter in room.  Vaughan Basta M.D on 12/18/2015 at 4:02 PM  Between 7am to 6pm - Pager - [phone]  After 6pm go to [email or web address] - password [email or web address] [facility] [facility]  Office  [phone]  CC: Primary care physician; Geoffery Lyons, MD

## 2015-12-18 NOTE — Progress Notes (Signed)
Will hold off on cardiac cath for now, risks may outweigh benefits, pt stable at this time, medically manage per MD Ellyn Hack, family is in agreement

## 2015-12-18 NOTE — Progress Notes (Signed)
[facility] NAME: Adam Nielsen    MR#:  [id number]  DATE OF BIRTH:  1925-02-21  SUBJECTIVE:  CHIEF COMPLAINT:   Chief Complaint  Patient presents with  . Fall  . Urinary Retention   - NSTEMI, cardiogenic shock, systolic BP stable now- not on pressors as maintaining MAP - off heparin drip due to hematuria, will need cath - planned for Monday - PVCs and NSVT - on amiodarone drip for afib - hematuria clearing- cardiology suggest to resume heparin drip without bolus and cath on Monday.  REVIEW OF SYSTEMS:  Review of Systems  Constitutional: Positive for malaise/fatigue. Negative for fever and chills.  HENT: Positive for hearing loss. Negative for ear discharge, ear pain and nosebleeds.   Eyes: Negative for blurred vision, double vision and photophobia.  Respiratory: Positive for shortness of breath. Negative for cough and wheezing.   Cardiovascular: Negative for chest pain, palpitations and leg swelling.  Gastrointestinal: Negative for nausea, vomiting, abdominal pain, diarrhea and constipation.  Genitourinary: Negative for dysuria and hematuria.  Musculoskeletal: Positive for back pain. Negative for myalgias.  Neurological: Negative for dizziness, sensory change, speech change, focal weakness, seizures and headaches.  Psychiatric/Behavioral: Negative for depression. The patient is not nervous/anxious.     DRUG ALLERGIES:  No Known Allergies  VITALS:  Blood pressure 127/71, pulse 95, temperature 98.2 F (36.8 C), temperature source Oral, resp. rate 22, height 5\' 7"  (1.702 m), weight 79.924 kg (176 lb 3.2 oz), SpO2 95 %.  PHYSICAL EXAMINATION:  Physical Exam  GENERAL:  82 y.o.-year-old elderly patient lying in the bed with no acute distress.  EYES: Pupils equal, round, reactive to light and accommodation. No scleral icterus. Extraocular muscles intact. Pale conjunctiva HEENT: Head atraumatic, normocephalic. Oropharynx  and nasopharynx clear.  NECK:  Supple, no jugular venous distention. No thyroid enlargement, no tenderness.  LUNGS: Normal breath sounds bilaterally, no wheezing, rales,rhonchi or crepitation. No use of accessory muscles of respiration. Decreased bibasilar breath sounds CARDIOVASCULAR: S1, S2 Rapid and irregular. 3/6 systolic murmur is present. ABDOMEN: Soft, nontender, nondistended. Bowel sounds present. No organomegaly or mass. Foley in place, light orange colour urine. EXTREMITIES: No pedal edema, cyanosis, or clubbing.  NEUROLOGIC: Cranial nerves II through XII are intact. Muscle strength 5/5 in all extremities. Able to move extremities in bed. Sensation intact. Gait not checked.  PSYCHIATRIC: The patient is alert and oriented x 2. Intermittent confusion noted- dementia at baseline SKIN: No obvious rash, lesion, or ulcer.    LABORATORY PANEL:   CBC  Recent Labs Lab 12/18/15 0702  WBC 10.2  HGB 10.7*  HCT 31.4*  PLT 153   ------------------------------------------------------------------------------------------------------------------  Chemistries   Recent Labs Lab 12/14/15 0005  12/16/15 0543  NA 130*  < > 134*  K 3.7  < > 3.8  CL 105  < > 111  CO2 18*  < > 16*  GLUCOSE 128*  < > 166*  BUN 21*  < > 22*  CREATININE 1.13  < > 1.24  CALCIUM 8.8*  < > 7.8*  AST 37  --   --   ALT 13*  --   --   ALKPHOS 52  --   --   BILITOT 0.5  --   --   < > = values in this interval not displayed. ------------------------------------------------------------------------------------------------------------------  Cardiac Enzymes  Recent Labs Lab 12/14/15 1547  TROPONINI 3.70*   ------------------------------------------------------------------------------------------------------------------  RADIOLOGY:  No results found.  EKG:  Orders placed or performed during the hospital encounter of 12/13/15  . EKG 12-Lead  . EKG 12-Lead    ASSESSMENT AND PLAN:   82 year old male  with past medical history significant for coronary artery disease status post CABG, paroxysmal atrial fibrillation not on anticoagulation, dementia, metastatic prostate cancer status post TURP 2 days ago, urinary retention and bladder outlet obstruction requiring indwelling Foley catheter presents to the hospital secondary to a fall.  #1 NSTEMI-elevated troponin, but steady now. -Could have had suffered an MI in the last couple of days. Currently denies any chest pain. -Heparin drip discontinued due to bleeding and hypotension. Appreciate cardiology consult. -Echocardiogram with EF 35% and wall motion abnormality, no baseline available, monitor on telemetry. -Add metoprolol, if blood pressure allows. Add statin -Aspirin started.- cardio planning cath on Monday.  #2 hypotension-cardiogenic shock likely -However due to recent instrumentation and urological procedure 2 days ago. Elevated white count and also lactic acid. -Blood cultures have been ordered. Check urine cultures. -Received 1 dose of vancomycin. Lactic acid improved. Antibiotics changed to Rocephin, ur cx negative. -no need for vasopressors. - now resumed metoprolol.  #3 Acute systolic CHF- hypotensive- fluid overloaded. -Likely ischemic cardiomyopathy. EF is 35%. -Not on diuretics. Monitor IV fluids to prevent pulmonary edema  #4 atrial fibrillation with rapid ventricular response-no history of paroxysmal atrial fibrillation. Currently with rapid ventricular rate. -Blood pressure was low normal, so oral medications or IV Cardizem cannot be used. -Continue amiodarone drip. Also PVCs -Cardiology help appreciated. -heparin drip has been discontinued due to hematuria and hypotension - now resumed without bolus, as hematuria seems resolved.  #5 BPH-status post TURP done 2 days before this admission at [facility] by urologist. -Continue Flomax. Has a Foley catheter now - also on casodex, proscar -Hematuria is clearing.  Hemoglobin is stable. On antibiotics for UTI  #6 DVT prophylaxis-heparin drip discontinued due to hematuria and hypertension. -Continue to monitor. Teds and SCDs for now  Physical therapy consult. -Discussed with son who is healthcare power of attorney, at bedside on admission. - According to patient's wishes, he is a DO NOT RESUSCITATE.   All the records are reviewed and case discussed with Care Management/Social Workerr. Management plans discussed with the patient, family and they are in agreement.  CODE STATUS: DNR  TOTAL CRITICAL CARE TIME SPENT IN TAKING CARE OF THIS PATIENT: 35 minutes.   POSSIBLE D/C IN 3-4 DAYS, DEPENDING ON CLINICAL CONDITION. Spoke to his son in room.  Vaughan Basta M.D on 12/18/2015 at 9:50 AM  Between 7am to 6pm - Pager - [phone]  After 6pm go to [email or web address] - password [email or web address] [facility]  Office  [phone]  CC: Primary care physician; Geoffery Lyons, MD

## 2015-12-18 NOTE — Progress Notes (Signed)
ANTICOAGULATION CONSULT NOTE - FOLLOW UP  Consult  Pharmacy Consult for Heparin Drip Indication: atrial fibrillation  No Known Allergies  Patient Measurements: Height: 5\' 7"  (170.2 cm) Weight: 176 lb 3.2 oz (79.924 kg) IBW/kg (Calculated) : 66.1 Heparin Dosing Weight: 80 kg  Vital Signs: Temp: 98.4 F (36.9 C) (04/02 1146) Temp Source: Oral (04/02 1146) BP: 120/67 mmHg (04/02 1146) Pulse Rate: 93 (04/02 1146)  Labs:  Recent Labs  12/16/15 0543 12/17/15 1256 12/17/15 2137 12/18/15 0702 12/18/15 1520  HGB 10.8*  --   --  10.7*  --   HCT 31.8*  --   --  31.4*  --   PLT 149*  --   --  153  --   APTT  --  38*  --   --   --   LABPROT  --  16.0*  --   --   --   INR  --  1.27  --   --   --   HEPARINUNFRC  --   --  0.26* 0.36 0.36  CREATININE 1.24  --   --   --   --     Estimated Creatinine Clearance: 40.9 mL/min (by C-G formula based on Cr of 1.24).   Medical History: Past Medical History  Diagnosis Date  . PAF (paroxysmal atrial fibrillation) (HCC)     a. not on long term full dose anticoagulation  . Hypothyroid   . Prostate CA ([place])   . Dementia   . Acute bronchitis 11/22/2015   . Chronic indwelling Foley catheter   . BPH (benign prostatic hypertrophy)   . Hyperlipidemia   . Urinary tract infection 11/22/2015   . Fall     11/22/2015    . CAD (coronary artery disease)     a. s/p CABG approx 1997; b. patient of Dr. Wynonia Lawman, MD    Medications:  Scheduled:  . amiodarone  400 mg Oral BID  . antiseptic oral rinse  7 mL Mouth Rinse BID  . aspirin EC  325 mg Oral Daily  . benzonatate  200 mg Oral TID  . bicalutamide  50 mg Oral Daily  . cefTRIAXone (ROCEPHIN)  IV  1 g Intravenous Q24H  . clobetasol ointment  1 application Topical BID  . docusate sodium  100 mg Oral BID  . donepezil  10 mg Oral Daily  . escitalopram  5 mg Oral Daily  . finasteride  5 mg Oral Daily  . furosemide  40 mg Oral Daily  . levothyroxine  50 mcg Oral QAC breakfast  . metoprolol tartrate   12.5 mg Oral BID  . niacin  500 mg Oral Daily  . rosuvastatin  5 mg Oral q1800  . sodium chloride flush  3 mL Intravenous Q12H  . tamsulosin  0.4 mg Oral QPC supper   Infusions:  . heparin 1,150 Units/hr (12/18/15 1116)    Assessment: Pharmacy consulted to initiate and titrate heparin drip in an 82 yo male with atrial fibrillation.  Patient currently on heparin drip at 1150 units/hr with therapeutic HL of 0.36 at 0700 this morning.   12/18/15 1500 HL of 0.36  Goal of Therapy:  Heparin level 0.3-0.7 units/ml Monitor platelets by anticoagulation protocol: Yes   Plan:  Heparin level resulted @ 0.36, which is therapeutic. Will continue with current rate and next HL will be with am labs.  Pharmacy will continue to follow.  Vira Blanco, Pharm.D., BCPS Clinical Pharmacist 12/18/2015,4:29 PM

## 2015-12-19 LAB — CBC
HCT: 31.2 % — ABNORMAL LOW (ref 40.0–52.0)
HCT: 33.3 % — ABNORMAL LOW (ref 40.0–52.0)
Hemoglobin: 10.9 g/dL — ABNORMAL LOW (ref 13.0–18.0)
Hemoglobin: 11.3 g/dL — ABNORMAL LOW (ref 13.0–18.0)
MCH: 32.1 pg (ref 26.0–34.0)
MCH: 31.1 pg (ref 26.0–34.0)
MCHC: 35 g/dL (ref 32.0–36.0)
MCHC: 33.9 g/dL (ref 32.0–36.0)
MCV: 91.7 fL (ref 80.0–100.0)
MCV: 91.8 fL (ref 80.0–100.0)
Platelets: 151 10*3/uL (ref 150–440)
Platelets: 175 10*3/uL (ref 150–440)
RBC: 3.4 MIL/uL — ABNORMAL LOW (ref 4.40–5.90)
RBC: 3.63 MIL/uL — ABNORMAL LOW (ref 4.40–5.90)
RDW: 14.9 % — AB (ref 11.5–14.5)
RDW: 15.1 % — ABNORMAL HIGH (ref 11.5–14.5)
WBC: 10.4 10*3/uL (ref 3.8–10.6)
WBC: 11.4 10*3/uL — ABNORMAL HIGH (ref 3.8–10.6)

## 2015-12-19 LAB — BASIC METABOLIC PANEL WITH GFR
Anion gap: 5 (ref 5–15)
BUN: 16 mg/dL (ref 6–20)
CO2: 21 mmol/L — ABNORMAL LOW (ref 22–32)
Calcium: 7.5 mg/dL — ABNORMAL LOW (ref 8.9–10.3)
Chloride: 108 mmol/L (ref 101–111)
Creatinine, Ser: 1.08 mg/dL (ref 0.61–1.24)
GFR calc Af Amer: >60 mL/min
GFR calc non Af Amer: 59 mL/min — ABNORMAL LOW
Glucose, Bld: 118 mg/dL — ABNORMAL HIGH (ref 65–99)
Potassium: 2.6 mmol/L — CL (ref 3.5–5.1)
Sodium: 134 mmol/L — ABNORMAL LOW (ref 135–145)

## 2015-12-19 LAB — MAGNESIUM: Magnesium: 1.9 mg/dL (ref 1.7–2.4)

## 2015-12-19 LAB — HEPARIN LEVEL (UNFRACTIONATED): Heparin Unfractionated: 0.3 [IU]/mL (ref 0.30–0.70)

## 2015-12-19 MED ORDER — ASPIRIN EC 81 MG PO TBEC
81.0000 mg | DELAYED_RELEASE_TABLET | Freq: Every day | ORAL | Status: DC
  Administered 2015-12-20 – 2015-12-21 (×2): 81 mg via ORAL
  Filled 2015-12-19 (×2): qty 1

## 2015-12-19 MED ORDER — POTASSIUM CHLORIDE CRYS ER 20 MEQ PO TBCR
40.0000 meq | EXTENDED_RELEASE_TABLET | Freq: Two times a day (BID) | ORAL | Status: DC
  Administered 2015-12-19 – 2015-12-21 (×5): 40 meq via ORAL
  Filled 2015-12-19 (×5): qty 2

## 2015-12-19 NOTE — Progress Notes (Signed)
Per PA Dunn we will keep heparin going another day in order to fully anticoagulate patient,  MD Anselm Jungling and PA Dunn have been notified of mild hematuria into urinary catheter.

## 2015-12-19 NOTE — Care Management Important Message (Signed)
Important Message  Patient Details  Name: Adam Nielsen MRN: [id number] Date of Birth: February 22, 1925   Medicare Important Message Given:  Yes    Juliann Pulse A [name] 12/19/2015, 1:49 PM

## 2015-12-19 NOTE — Progress Notes (Signed)
CSW contacted with Midge [employer]- [employer] at [facility] to inform her that patient is currently on 2A. Left voicemail. Awaiting phone call back. FL2 updated. CSW will continue to follow and assist.   Ernest Pine, MSW, [place] Work Department [phone]

## 2015-12-19 NOTE — Progress Notes (Signed)
Patient: Adam Nielsen / Admit Date: 12/13/2015 / Date of Encounter: 12/19/2015, 1:31 PM   Subjective: Pleasantly confused. Son here this morning. Redeveloped hematuria, stable hgb. Decreased PO appetite.   Review of Systems: Review of Systems  Unable to perform ROS: dementia     Objective: Telemetry: Afib, 70's Physical Exam: Blood pressure 113/63, pulse 66, temperature 97.9 F (36.6 C), temperature source Oral, resp. rate 16, height 5\' 7"  (1.702 m), weight 176 lb 9.6 oz (80.105 kg), SpO2 95 %. Body mass index is 27.65 kg/(m^2). General: Frail appearing, in no acute distress. Head: Normocephalic, atraumatic, sclera non-icteric, no xanthomas, nares are without discharge. Neck: Negative for carotid bruits. JVP not elevated. Lungs: Clear bilaterally to auscultation without wheezes, rales, or rhonchi. Breathing is unlabored. Heart: Irregularly irregular S1 S2, II/VI HSM at RUSB, no rubs, or gallops.  Abdomen: Soft, non-tender, non-distended with normoactive bowel sounds. No rebound/guarding. Extremities: No clubbing or cyanosis. No edema. Distal pedal pulses are 2+ and equal bilaterally. Neuro: Alert. Moves all extremities spontaneously. Psych:  Responds to questions appropriately with a normal affect. Slightly confused.    Intake/Output Summary (Last 24 hours) at 12/19/15 1331 Last data filed at 12/19/15 0900  Gross per 24 hour  Intake      0 ml  Output   1450 ml  Net  -1450 ml    Inpatient Medications:  . amiodarone  400 mg Oral BID  . antiseptic oral rinse  7 mL Mouth Rinse BID  . aspirin EC  325 mg Oral Daily  . benzonatate  200 mg Oral TID  . bicalutamide  50 mg Oral Daily  . cefTRIAXone (ROCEPHIN)  IV  1 g Intravenous Q24H  . clobetasol ointment  1 application Topical BID  . docusate sodium  100 mg Oral BID  . donepezil  10 mg Oral Daily  . escitalopram  5 mg Oral Daily  . finasteride  5 mg Oral Daily  . furosemide  40 mg Oral Daily  . levothyroxine  50 mcg Oral  QAC breakfast  . metoprolol tartrate  12.5 mg Oral BID  . niacin  500 mg Oral Daily  . potassium chloride  40 mEq Oral BID  . rosuvastatin  5 mg Oral q1800  . sodium chloride flush  3 mL Intravenous Q12H  . tamsulosin  0.4 mg Oral QPC supper   Infusions:  . heparin 1,150 Units/hr (12/18/15 1116)    Labs:  Recent Labs  12/19/15 0455  NA 134*  K 2.6*  CL 108  CO2 21*  GLUCOSE 118*  BUN 16  CREATININE 1.08  CALCIUM 7.5*  MG 1.9   No results for input(s): AST, ALT, ALKPHOS, BILITOT, PROT, ALBUMIN in the last 72 hours.  Recent Labs  12/18/15 0702 12/19/15 0455  WBC 10.2 10.4  HGB 10.7* 10.9*  HCT 31.4* 31.2*  MCV 93.0 91.7  PLT 153 151   No results for input(s): CKTOTAL, CKMB, TROPONINI in the last 72 hours. Invalid input(s): POCBNP No results for input(s): HGBA1C in the last 72 hours.   Weights: Filed Weights   12/17/15 0634 12/18/15 0500 12/19/15 0326  Weight: 177 lb 1.6 oz (80.332 kg) 176 lb 3.2 oz (79.924 kg) 176 lb 9.6 oz (80.105 kg)     Radiology/Studies:  Dg Chest 2 View  11/22/2015  CLINICAL DATA:  Cough and chest pain EXAM: CHEST  2 VIEW COMPARISON:  12/23/2014 FINDINGS: Postop CABG. Negative for heart failure. Lungs are clear without infiltrate effusion or mass.  Unchanged from the prior study. IMPRESSION: No active cardiopulmonary disease. Electronically Signed   By: Franchot Gallo M.D.   On: 11/22/2015 21:17   Ct Head Wo Contrast  11/22/2015  CLINICAL DATA:  Fall x3, possible UTI EXAM: CT HEAD WITHOUT CONTRAST TECHNIQUE: Contiguous axial images were obtained from the base of the skull through the vertex without intravenous contrast. COMPARISON:  12/23/2014 FINDINGS: No evidence of parenchymal hemorrhage or extra-axial fluid collection. No mass lesion, mass effect, or midline shift. No CT evidence of acute infarction. Extensive subcortical white matter and periventricular small vessel ischemic changes. Intracranial atherosclerosis. Global cortical and central  atrophy.  No ventriculomegaly. Partial opacification of the bilateral ethmoid sinuses. Mucosal thickening with layering fluid in the bilateral maxillary sinuses. Mastoid air cells are clear. Old right lamina papyracea fracture. No evidence of calvarial fracture. IMPRESSION: No evidence of acute intracranial abnormality. Atrophy with small vessel ischemic changes. Electronically Signed   By: Julian Hy M.D.   On: 11/22/2015 21:03     Assessment and Plan   1. NSTEMI/CAD s/p CABG: -No anginal symptoms  -On heparin gtt for NSTEMI medical management (started 4/2) and Afib -He has completed 48 hours of heparin at this time, ok to discontinue -Will check a stat CBC to evaluate hgb given hematuria this morning -Family is less inclined at this time for invasive evaluation   -Decrease aspirin to 81 mg -Continue statin   2. PAF: -Consider long term, full-dose anticoagulation if hgb is stable this afternoon -Could consider Afib ablation as an outpatient  -Currently rate controlled -Continue PO amiodarone, Lopressor  -CHADS2VASc at least 4 (CHF, age x 2, vascular disease)  3. Hematuria: -Check CBC as above -Likely in the setting of recent TURP -Possibly post-traumatic in the setting of Foley catheter as well  4. ICM: -EF 35-40% -He does not appear volume overloaded -Not on Toprol at this time given need for Lopressor for rate control -Continue Lopressor, Lasix -Add low dose lisinopril 2.5 mg daily given cardiomyopathy  5. Hypokalemia: -Has orders for KCl repletion     SignedChristell Faith, PA-C Pager: [phone] 12/19/2015, 1:31 PM

## 2015-12-19 NOTE — NC FL2 (Signed)
[place] LEVEL OF CARE SCREENING TOOL     IDENTIFICATION  Patient Name: Adam Nielsen Birthdate: 08-14-1925 Sex: male Admission Date (Current Location): 12/13/2015  [place] and [place] Number:  Engineering geologist and Address:  [facility], [address] [place], [place] [address]      Provider Number: [id number]  Attending Physician Name and Address:  Vaughan Basta, MD  Relative Name and Phone Number:       Current Level of Care: Hospital Recommended Level of Care: SNF  Prior Approval Number:    Date Approved/Denied:   PASRR Number:   [id number]  Discharge Plan: SNF   Current Diagnoses: Patient Active Problem List   Diagnosis Date Noted  . Pressure ulcer 12/18/2015  . Urinary retention   . Paroxysmal atrial fibrillation (HCC)   . Typical atrial flutter ([place])   . NSTEMI (non-ST elevated myocardial infarction) ([place]) 12/14/2015  . Atrial fibrillation with rapid ventricular response ([place])   . S/P TURP   . Hematuria   . S/P CABG (coronary artery bypass graft)   . Hypotension   . BPH (benign prostatic hypertrophy) with urinary retention 12/12/2015  . UTI (urinary tract infection) 11/23/2015  . UTI (lower urinary tract infection) 11/22/2015  . Prostate cancer ([place]) 11/22/2015  . Hypothyroidism 11/22/2015  . Atrial fibrillation ([place]) 11/22/2015  . Dementia 11/22/2015  . Fall 11/22/2015  . Bronchitis 11/22/2015    Orientation RESPIRATION BLADDER Height & Weight     Self, Place  O2 (Nasal Cannula 2 (L/min) ) Continent Weight: 176 lb 9.6 oz (80.105 kg) Height:  5\' 7"  (170.2 cm)  BEHAVIORAL SYMPTOMS/MOOD NEUROLOGICAL BOWEL NUTRITION STATUS   (None)  (None) Continent Diet (Heart )  AMBULATORY STATUS COMMUNICATION OF NEEDS Skin   Limited Assist Verbally Other (Comment) (Preussure Ulcer Stage 2 Left heel)                       Personal Care Assistance Level of Assistance  Bathing, Feeding, Dressing Bathing  Assistance: Limited assistance Feeding assistance: Independent Dressing Assistance: Limited assistance     Functional Limitations Info  Sight, Hearing, Speech Sight Info: Adequate Hearing Info: Impaired Speech Info: Adequate    SPECIAL CARE FACTORS FREQUENCY                Contractures Contractures Info: Not present    Additional Factors Info  Code Status, Allergies Code Status Info:  (DNR) Allergies Info:  (No Know Allergies )           Current Medications (12/19/2015):  This is the current hospital active medication list Current Facility-Administered Medications  Medication Dose Route Frequency Provider Last Rate Last Dose  . acetaminophen (TYLENOL) tablet 650 mg  650 mg Oral Q6H PRN Harrie Foreman, MD       Or  . acetaminophen (TYLENOL) suppository 650 mg  650 mg Rectal Q6H PRN Harrie Foreman, MD      . albuterol (PROVENTIL) (2.5 MG/3ML) 0.083% nebulizer solution 2.5 mg  2.5 mg Nebulization Q6H PRN Harrie Foreman, MD   2.5 mg at 12/17/15 0934  . amiodarone (PACERONE) tablet 400 mg  400 mg Oral BID Leonie Man, MD   400 mg at 12/19/15 0908  . antiseptic oral rinse (CPC / CETYLPYRIDINIUM CHLORIDE 0.05%) solution 7 mL  7 mL Mouth Rinse BID Vaughan Basta, MD   7 mL at 12/19/15 0913  . aspirin EC tablet 325 mg  325 mg Oral  Daily Harrie Foreman, MD   325 mg at 12/19/15 0913  . benzonatate (TESSALON) capsule 200 mg  200 mg Oral TID Harrie Foreman, MD   200 mg at 12/19/15 0908  . bicalutamide (CASODEX) tablet 50 mg  50 mg Oral Daily Harrie Foreman, MD   50 mg at 12/19/15 0913  . cefTRIAXone (ROCEPHIN) 1 g in dextrose 5 % 50 mL IVPB  1 g Intravenous Q24H Vishal Mungal, MD   1 g at 12/19/15 0909  . clobetasol ointment (TEMOVATE) [id number] % 1 application  1 application Topical BID Harrie Foreman, MD   1 application at [id number]  . docusate sodium (COLACE) capsule 100 mg  100 mg Oral BID Harrie Foreman, MD   100 mg at 12/19/15 0908  . donepezil  (ARICEPT) tablet 10 mg  10 mg Oral Daily Harrie Foreman, MD   10 mg at 12/19/15 [id number]  . escitalopram (LEXAPRO) tablet 5 mg  5 mg Oral Daily Harrie Foreman, MD   5 mg at 12/19/15 0908  . finasteride (PROSCAR) tablet 5 mg  5 mg Oral Daily Harrie Foreman, MD   5 mg at 12/19/15 [id number]  . furosemide (LASIX) tablet 40 mg  40 mg Oral Daily Leonie Man, MD   40 mg at 12/19/15 [id number]  . heparin ADULT infusion 100 units/mL (25000 units/250 mL)  1,150 Units/hr Intravenous Continuous Vaughan Basta, MD 11.5 mL/hr at 12/18/15 1116 1,150 Units/hr at 12/18/15 1116  . hydrOXYzine (ATARAX/VISTARIL) tablet 25 mg  25 mg Oral Q6H PRN Harrie Foreman, MD      . levothyroxine (SYNTHROID, LEVOTHROID) tablet 50 mcg  50 mcg Oral QAC breakfast Harrie Foreman, MD   50 mcg at 12/19/15 [id number]  . metoprolol tartrate (LOPRESSOR) tablet 12.5 mg  12.5 mg Oral BID Leonie Man, MD   12.5 mg at 12/19/15 0908  . morphine 2 MG/ML injection 1 mg  1 mg Intravenous Q3H PRN Harrie Foreman, MD   1 mg at 12/17/15 1233  . niacin tablet 500 mg  500 mg Oral Daily Harrie Foreman, MD   500 mg at 12/19/15 0913  . ondansetron (ZOFRAN) tablet 4 mg  4 mg Oral Q6H PRN Harrie Foreman, MD       Or  . ondansetron [facility]) injection 4 mg  4 mg Intravenous Q6H PRN Harrie Foreman, MD   4 mg at 12/14/15 1303  . potassium chloride SA (K-DUR,KLOR-CON) CR tablet 40 mEq  40 mEq Oral BID Vaughan Basta, MD      . rosuvastatin (CRESTOR) tablet 5 mg  5 mg Oral q1800 Gladstone Lighter, MD   5 mg at 12/18/15 1644  . sodium chloride flush (NS) 0.9 % injection 3 mL  3 mL Intravenous Q12H Harrie Foreman, MD   3 mL at 12/19/15 0914  . tamsulosin (FLOMAX) capsule 0.4 mg  0.4 mg Oral QPC supper Harrie Foreman, MD   0.4 mg at 12/18/15 1644  . zolpidem (AMBIEN) tablet 2.5 mg  2.5 mg Oral QHS PRN Harrie Foreman, MD   2.5 mg at 12/17/15 0017     Discharge Medications: Please see discharge summary for a list of discharge  medications.  Relevant Imaging Results:  Relevant Lab Results:   Additional Information  ([id number])  Lorenso Quarry [place], LCSW

## 2015-12-19 NOTE — Progress Notes (Signed)
ANTICOAGULATION CONSULT NOTE - FOLLOW UP  Consult  Pharmacy Consult for Heparin Drip Indication: atrial fibrillation  No Known Allergies  Patient Measurements: Height: 5\' 7"  (170.2 cm) Weight: 176 lb 9.6 oz (80.105 kg) IBW/kg (Calculated) : 66.1 Heparin Dosing Weight: 80 kg  Vital Signs: Temp: 98.2 F (36.8 C) (04/03 0504) Temp Source: Oral (04/03 0504) BP: 119/67 mmHg (04/03 0504) Pulse Rate: 79 (04/03 0504)  Labs:  Recent Labs  12/17/15 1256  12/18/15 0702 12/18/15 1520 12/19/15 0455  HGB  --   --  10.7*  --  10.9*  HCT  --   --  31.4*  --  31.2*  PLT  --   --  153  --  151  APTT 38*  --   --   --   --   LABPROT 16.0*  --   --   --   --   INR 1.27  --   --   --   --   HEPARINUNFRC  --   < > 0.36 0.36 0.30  CREATININE  --   --   --   --  1.08  < > = values in this interval not displayed.  Estimated Creatinine Clearance: 47 mL/min (by C-G formula based on Cr of 1.08).   Medical History: Past Medical History  Diagnosis Date  . PAF (paroxysmal atrial fibrillation) (HCC)     a. not on long term full dose anticoagulation  . Hypothyroid   . Prostate CA ([place])   . Dementia   . Acute bronchitis 11/22/2015   . Chronic indwelling Foley catheter   . BPH (benign prostatic hypertrophy)   . Hyperlipidemia   . Urinary tract infection 11/22/2015   . Fall     11/22/2015    . CAD (coronary artery disease)     a. s/p CABG approx 1997; b. patient of Dr. Wynonia Lawman, MD    Medications:  Scheduled:  . amiodarone  400 mg Oral BID  . antiseptic oral rinse  7 mL Mouth Rinse BID  . aspirin EC  325 mg Oral Daily  . benzonatate  200 mg Oral TID  . bicalutamide  50 mg Oral Daily  . cefTRIAXone (ROCEPHIN)  IV  1 g Intravenous Q24H  . clobetasol ointment  1 application Topical BID  . docusate sodium  100 mg Oral BID  . donepezil  10 mg Oral Daily  . escitalopram  5 mg Oral Daily  . finasteride  5 mg Oral Daily  . furosemide  40 mg Oral Daily  . levothyroxine  50 mcg Oral QAC breakfast   . metoprolol tartrate  12.5 mg Oral BID  . niacin  500 mg Oral Daily  . rosuvastatin  5 mg Oral q1800  . sodium chloride flush  3 mL Intravenous Q12H  . tamsulosin  0.4 mg Oral QPC supper   Infusions:  . heparin 1,150 Units/hr (12/18/15 1116)    Assessment: Pharmacy consulted to initiate and titrate heparin drip in an 82 yo male with atrial fibrillation.  Patient currently on heparin drip at 1150 units/hr with therapeutic HL of 0.36 at 0700 this morning.   12/18/15 1500 HL of 0.36  Goal of Therapy:  Heparin level 0.3-0.7 units/ml Monitor platelets by anticoagulation protocol: Yes   Plan:  Heparin level resulted @ 0.36, which is therapeutic. Will continue with current rate and next HL will be with am labs.  Pharmacy will continue to follow.  Laural Benes, Pharm.D., BCPS Clinical Pharmacist 12/19/2015,7:23 AM

## 2015-12-19 NOTE — Progress Notes (Signed)
PT Cancellation Note  Patient Details Name: Adam Nielsen MRN: [id number] DOB: 1924-11-27   Cancelled Treatment:    Reason Eval/Treat Not Completed: Other (comment) Consult received and chart reviewed. Pt possibly for cardiac cath this date. Will hold therapy evaluation until cardiology clears pt for participation. Will follow.    Sherral Hammers 12/19/2015, 9:30 AM M. Barnett Abu, SPT

## 2015-12-20 LAB — CBC
HCT: 31.5 % — ABNORMAL LOW (ref 40.0–52.0)
Hemoglobin: 11.1 g/dL — ABNORMAL LOW (ref 13.0–18.0)
MCH: 32.5 pg (ref 26.0–34.0)
MCHC: 35.2 g/dL (ref 32.0–36.0)
MCV: 92.3 fL (ref 80.0–100.0)
PLATELETS: 163 10*3/uL (ref 150–440)
RBC: 3.41 MIL/uL — ABNORMAL LOW (ref 4.40–5.90)
RDW: 15.1 % — AB (ref 11.5–14.5)
WBC: 9 10*3/uL (ref 3.8–10.6)

## 2015-12-20 LAB — BASIC METABOLIC PANEL
Anion gap: 4 — ABNORMAL LOW (ref 5–15)
BUN: 15 mg/dL (ref 6–20)
CALCIUM: 8 mg/dL — AB (ref 8.9–10.3)
CO2: 25 mmol/L (ref 22–32)
CREATININE: 1.06 mg/dL (ref 0.61–1.24)
Chloride: 107 mmol/L (ref 101–111)
GFR calc Af Amer: >60 mL/min (ref >60)
GLUCOSE: 139 mg/dL — AB (ref 65–99)
POTASSIUM: 2.7 mmol/L — AB (ref 3.5–5.1)
Sodium: 136 mmol/L (ref 135–145)

## 2015-12-20 LAB — CULTURE, BLOOD (ROUTINE X 2)
Culture: NO GROWTH
Culture: NO GROWTH

## 2015-12-20 LAB — POTASSIUM: Potassium: 3.7 mmol/L (ref 3.5–5.1)

## 2015-12-20 MED ORDER — APIXABAN 2.5 MG PO TABS
2.5000 mg | ORAL_TABLET | Freq: Two times a day (BID) | ORAL | Status: DC
  Administered 2015-12-20 – 2015-12-21 (×2): 2.5 mg via ORAL
  Filled 2015-12-20 (×2): qty 1

## 2015-12-20 MED ORDER — POTASSIUM CHLORIDE 10 MEQ/100ML IV SOLN
10.0000 meq | INTRAVENOUS | Status: AC
  Administered 2015-12-20 (×2): 10 meq via INTRAVENOUS
  Filled 2015-12-20 (×2): qty 100

## 2015-12-20 MED ORDER — POTASSIUM CHLORIDE 10 MEQ/100ML IV SOLN
10.0000 meq | INTRAVENOUS | Status: AC
  Administered 2015-12-20 (×4): 10 meq via INTRAVENOUS
  Filled 2015-12-20 (×4): qty 100

## 2015-12-20 MED ORDER — LISINOPRIL 5 MG PO TABS
2.5000 mg | ORAL_TABLET | Freq: Every day | ORAL | Status: DC
  Administered 2015-12-20 – 2015-12-21 (×2): 2.5 mg via ORAL
  Filled 2015-12-20 (×2): qty 1

## 2015-12-20 NOTE — Evaluation (Signed)
Physical Therapy Evaluation Patient Details Name: Adam Nielsen MRN: [id number] DOB: [birth year] Today's Date: 12/20/2015   History of Present Illness  Pt is a pleasant 82 y.o. m admitted to hospital for a fall. Diagnosed w/ NSTEMI and A-fib. Pt has hx of coronary disease, CABG, prostate cancer, TURP (3/27), and dementia. Pt lived in ALF prior to admission. Pt has adult children in the area.   Clinical Impression  Pt is a pleasant 82 y.o. M admitted to hospital for NSTEMI and A-fib. Spoke to nursing prior to evaluation, pt cleared for PT per cardiology. Pt has hx of coronary disease, CABG, prostate cancer, TURP (3/27), and dementia. Pt awake and alert. Pt slightly confused, and unable to recall living arrangements prior to admission. Prior to admission, pt resided at [facility]. Pt was independent w/ all ADLs. Pt used 4-wheel RW for ambulation. Pt has children in the area. Son in room at start of session, and able to help answer questions regarding PLOF. Pt demonstrates good B UE and LE strength. Pt able to perform bed mobility with min assist and use of bed rails. Pt able to perform sit-to-stand from EOB using RW and min guard. Pt impulsive and stood up prior to PTs instruction. Pt provided heavy cues on hand placement w/transfer. Pt able to ambulate approx. 3 ft. with RW and min assist. 2+ assist required during ambulation for safety and equipment. Pt demonstrated unfamiliarity with 2-wheel walker, requiring frequent cues regarding proper walker and foot placement t/o ambulation. Pt performed supine there-ex x10 with min assist and frequent cues. Upon start of session, pts O2 was 94% on 2L. O2 dropped to 89% after ambulation. Pt kept on 2L O2 for entire session. Pt provided frequent cues to keep nasal cannula in nose. Pt demonstrated willingness to participate in therapy. Pt demonstrates deficits in strength, balance, and mobility. Pt would benefit from further skilled therapy to address deficits and  return to PLOF; recommend SNF after discharge from acute hospitalization.     Follow Up Recommendations SNF    Equipment Recommendations  Rolling walker with 5" wheels    Recommendations for Other Services       Precautions / Restrictions Precautions Precautions: Fall Restrictions Weight Bearing Restrictions: No      Mobility  Bed Mobility Overal bed mobility: Needs Assistance Bed Mobility: Supine to Sit     Supine to sit: Min assist     General bed mobility comments: Pt able to perform supine to sit to EOB with min assist and use of bed rails. Pt given frequent cues on hand and foot placement. Pt required assist with raising trunk and scooting to EOB.   Transfers Overall transfer level: Modified independent Equipment used: Rolling walker (2 wheeled) Transfers: Sit to/from Stand Sit to Stand: Min guard         General transfer comment: Pt able to perform transfer from sit to stand from EOB with use of RW and CGA. Pt provided frequent cues on hand placement. Pt eager to stand, and began prior to PTs instruction. Once standing, pt demonstrated kyphotic posture.   Ambulation/Gait Ambulation/Gait assistance: Min assist Ambulation Distance (Feet): 3 Feet Assistive device: Rolling walker (2 wheeled) Gait Pattern/deviations: Shuffle     General Gait Details: Pt able to ambulate from EOB to recliner using RW and min assist. Pt provided frequent verbal and tactile cues for placement of walker and LE. Pt demonstrated very slow gait speed. Pt demonstrated forward trunk lean w/heavy reliance of B UE on  walker t/o ambulation.   Stairs            Wheelchair Mobility    Modified Rankin (Stroke Patients Only)       Balance Overall balance assessment: Modified Independent Sitting-balance support: Single extremity supported;Feet supported Sitting balance-Leahy Scale: Fair     Standing balance support: Bilateral upper extremity supported Standing balance-Leahy  Scale: Fair                               Pertinent Vitals/Pain Pain Assessment: No/denies pain    Home Living Family/patient expects to be discharged to:: Assisted living Living Arrangements: Other (Comment) ([place])             Home Equipment: Walker - 4 wheels Additional Comments: Pt used rollator walker prior to admission.     Prior Function Level of Independence: Independent with assistive device(s)         Comments: Pt able to perform ADLs indepdently prior to admission with use of 4-wheel RW.Marland Kitchen      Hand Dominance        Extremity/Trunk Assessment   Upper Extremity Assessment: LUE deficits/detail;RUE deficits/detail RUE Deficits / Details: Grossly 4/5 strength in R UE and grip. Limited shoulder flex ROM.     LUE Deficits / Details: Grossly 4/5 strength in L UE and grip. Limited shoulder flex ROM   Lower Extremity Assessment: RLE deficits/detail;LLE deficits/detail RLE Deficits / Details: Grossly 4/5 strength in R LE  LLE Deficits / Details: Grossly 4/5 strength in L LE  Cervical / Trunk Assessment: Kyphotic  Communication   Communication: No difficulties  Cognition Arousal/Alertness: Awake/alert Behavior During Therapy: WFL for tasks assessed/performed Overall Cognitive Status: History of cognitive impairments - at baseline       Memory: Decreased short-term memory              General Comments      Exercises Other Exercises Other Exercises: Pt performed supine ther-ex on B LE including SLR, hip abd/add, and heel slides with min assist. Pt required frequent cueing for correct technique. All ther-ex performed x10 reps.       Assessment/Plan    PT Assessment Patient needs continued PT services  PT Diagnosis Difficulty walking;Abnormality of gait;Generalized weakness   PT Problem List Decreased strength;Decreased activity tolerance;Decreased balance;Decreased mobility;Decreased knowledge of use of DME  PT Treatment  Interventions DME instruction;Gait training;Therapeutic exercise;Therapeutic activities;Balance training   PT Goals (Current goals can be found in the Care Plan section) Acute Rehab PT Goals Patient Stated Goal: pt does not state PT Goal Formulation: Patient unable to participate in goal setting Time For Goal Achievement: 01/03/16 Potential to Achieve Goals: Good    Frequency Min 2X/week   Barriers to discharge Decreased caregiver support Pt currently unable to ambulate and perform ADLs independently.     Co-evaluation               End of Session Equipment Utilized During Treatment: Gait belt Activity Tolerance: Patient tolerated treatment well Patient left: in chair;with call bell/phone within reach;with chair alarm set Nurse Communication: Mobility status         Time: 1345-1411 PT Time Calculation (min) (ACUTE ONLY): 26 min   Charges:   PT Evaluation $PT Eval Moderate Complexity: 1 Procedure PT Treatments $Therapeutic Exercise: 8-22 mins   PT G Codes:        Sherral Hammers 01/02/16, 3:53 PM M. Barnett Abu, SPT

## 2015-12-20 NOTE — Progress Notes (Signed)
[facility] [place] NAME: Adam Nielsen    MR#:  [id number]  DATE OF BIRTH:  [birth year]  SUBJECTIVE:  CHIEF COMPLAINT:   Chief Complaint  Patient presents with  . Fall  . Urinary Retention   - NSTEMI, cardiogenic shock, systolic BP stable now- not on pressors as maintaining MAP - off heparin drip due to hematuria, will need cath - planned for Monday - PVCs and NSVT - on amiodarone drip for afib - hematuria clearing- cardiology suggest to resume heparin drip without bolus and cath on Monday. - today on 12/18/15 after discussing with family, cardiologist came to conclusion of not to do cath- due to his old age and dementia. Stopped amio drip- switch to oral. Pt does not have any complains.   cardio suggested to stop heparin drip, and will decide about oral anticoagulation. Need PT eval.  potassium low. o family members at bedsite today.  REVIEW OF SYSTEMS:  Review of Systems  Constitutional: Positive for malaise/fatigue. Negative for fever and chills.  HENT: Positive for hearing loss. Negative for ear discharge, ear pain and nosebleeds.   Eyes: Negative for blurred vision, double vision and photophobia.  Respiratory: Positive for shortness of breath. Negative for cough and wheezing.   Cardiovascular: Negative for chest pain, palpitations and leg swelling.  Gastrointestinal: Negative for nausea, vomiting, abdominal pain, diarrhea and constipation.  Genitourinary: Negative for dysuria and hematuria.  Musculoskeletal: Positive for back pain. Negative for myalgias.  Neurological: Negative for dizziness, sensory change, speech change, focal weakness, seizures and headaches.  Psychiatric/Behavioral: Negative for depression. The patient is not nervous/anxious.     DRUG ALLERGIES:  No Known Allergies  VITALS:  Blood pressure 127/85, pulse 83, temperature 98 F (36.7 C), temperature source Oral, resp. rate 20, height 5\' 7"  (1.702 m),  weight 80.151 kg (176 lb 11.2 oz), SpO2 98 %.  PHYSICAL EXAMINATION:  Physical Exam  GENERAL:  82 y.o.-year-old elderly patient lying in the bed with no acute distress.  EYES: Pupils equal, round, reactive to light and accommodation. No scleral icterus. Extraocular muscles intact. Pale conjunctiva HEENT: Head atraumatic, normocephalic. Oropharynx and nasopharynx clear.  NECK:  Supple, no jugular venous distention. No thyroid enlargement, no tenderness.  LUNGS: Normal breath sounds bilaterally, no wheezing, rales,rhonchi or crepitation. No use of accessory muscles of respiration. Decreased bibasilar breath sounds CARDIOVASCULAR: S1, S2 Rapid and irregular. 3/6 systolic murmur is present. ABDOMEN: Soft, nontender, nondistended. Bowel sounds present. No organomegaly or mass. Foley in place, light orange colour urine. EXTREMITIES: No pedal edema, cyanosis, or clubbing.  NEUROLOGIC: Cranial nerves II through XII are intact. Muscle strength 5/5 in all extremities. Able to move extremities in bed. Sensation intact. Gait not checked.  PSYCHIATRIC: The patient is alert and oriented x 2. Intermittent confusion noted- dementia at baseline SKIN: No obvious rash, lesion, or ulcer.    LABORATORY PANEL:   CBC  Recent Labs Lab 12/20/15 0500  WBC 9.0  HGB 11.1*  HCT 31.5*  PLT 163   ------------------------------------------------------------------------------------------------------------------  Chemistries   Recent Labs Lab 12/14/15 0005  12/19/15 0455 12/20/15 0500 12/20/15 1442  NA 130*  < > 134* 136  --   K 3.7  < > 2.6* 2.7* 3.7  CL 105  < > 108 107  --   CO2 18*  < > 21* 25  --   GLUCOSE 128*  < > 118* 139*  --   BUN 21*  < > 16 15  --  CREATININE 1.13  < > 1.08 1.06  --   CALCIUM 8.8*  < > 7.5* 8.0*  --   MG  --   --  1.9  --   --   AST 37  --   --   --   --   ALT 13*  --   --   --   --   ALKPHOS 52  --   --   --   --   BILITOT 0.5  --   --   --   --   < > = values in  this interval not displayed. ------------------------------------------------------------------------------------------------------------------  Cardiac Enzymes  Recent Labs Lab 12/14/15 1547  TROPONINI 3.70*   ------------------------------------------------------------------------------------------------------------------  RADIOLOGY:  No results found.  EKG:   Orders placed or performed during the hospital encounter of 12/13/15  . EKG 12-Lead  . EKG 12-Lead    ASSESSMENT AND PLAN:   82 year old male with past medical history significant for coronary artery disease status post CABG, paroxysmal atrial fibrillation not on anticoagulation, dementia, metastatic prostate cancer status post TURP 2 days ago, urinary retention and bladder outlet obstruction requiring indwelling Foley catheter presents to the hospital secondary to a fall.  #1 NSTEMI-elevated troponin, but steady now. -Could have had suffered an MI in the last couple of days. Currently denies any chest pain. -Heparin drip  Was given for 48 hours -Echocardiogram with EF 35% and wall motion abnormality, no baseline available, monitor on telemetry. -Added metoprolol. Added statin -Aspirin started.- cardio was planning cath initially, but after discussing in detail with son and daughter- decided not to do cath and just monitor with conservative management.  #2 hypotension-cardiogenic shock likely -However due to recent instrumentation and urological procedure 2 days ago. Elevated white count and also lactic acid. -Blood cultures have been ordered. Check urine cultures. -Received 1 dose of vancomycin. Lactic acid improved. Antibiotics changed to Rocephin, ur cx negative. -no need for vasopressors. - now resumed metoprolol.  #3 Acute systolic CHF- hypotensive- fluid overloaded. -Likely ischemic cardiomyopathy. EF is 35%. -Not on diuretics. Monitor IV fluids to prevent pulmonary edema - added lisinopril.  #4 atrial  fibrillation with rapid ventricular response-no history of paroxysmal atrial fibrillation. was with rapid ventricular rate. -Blood pressure was low normal, so oral medications or IV Cardizem cannot be used. -Given amiodarone drip. Also PVCs -Cardiology help appreciated. -heparin drip has been discontinued initially due to hematuria and hypotension -  resumed heparin without bolus, as hematuria seems resolved. He received heparin for > 48 hours. - switched to oral amio, cont heparin for anticoagulation. - oral eliquis started today, will give 2.5 mg BID due to old age and recent prostate surgery.  #5 BPH-status post TURP done 2 days before this admission at [facility] by urologist. -Continue Flomax. Has a Foley catheter now - also on casodex, proscar -Hematuria is clearing. Hemoglobin is stable. On antibiotics for UTI - I will advise to discharge with Foley until he follows with urologist.  #6 DVT prophylaxis-heparin drip discontinued due to hematuria and hypertension. -Continue to monitor. Teds and SCDs for now  Physical therapy consult. Need SNF- may arrange tomorrow.  -Discussed with son who is healthcare power of attorney, at bedside on admission. - According to patient's wishes, he is a DO NOT RESUSCITATE.   All the records are reviewed and case discussed with Care Management/Social Workerr. Management plans discussed with the patient, family and they are in agreement.  CODE STATUS: DNR  TOTAL TIME SPENT  IN TAKING CARE OF THIS PATIENT: 35 minutes.   POSSIBLE D/C IN 2-3 DAYS, DEPENDING ON CLINICAL CONDITION.   Vaughan Basta M.D on 12/20/2015 at 5:30 PM  Between 7am to 6pm - Pager - [phone]  After 6pm go to [email or web address] - password [email or web address] [facility]  Office  [phone]  CC: Primary care physician; Geoffery Lyons, MD

## 2015-12-20 NOTE — Progress Notes (Signed)
CSW spoke to [place] at [facility] she reports that she'd like to review patient's paperwork at discharge. Reported information be faxed to [phone]. Stated that patient has to be able to ambulate to return to their facility. CSW informed her that patient's PT eval is pending.  CSW will await PT eval to determine appropriate level of care for patient. CSW will continue to follow and assist  Ernest Pine, MSW, [place] Work Department [phone]

## 2015-12-20 NOTE — Care Management (Signed)
Cariology will not proceed with cardiac cath due to age, dementia.  Transitioned from amiodarone drip to oral.   At present time, continue to anticipate discharge back to [facility] with resumption of home health SN and PT through [facility] unless physical therapy recommends higher level of care.  PT consult is pending.  Unable to treat 4/3 due to concerns that patient is not cleared by cardiology.

## 2015-12-20 NOTE — Progress Notes (Signed)
Patient critical potassium level reported. MD notified. Acknowledged. New orders placed.

## 2015-12-20 NOTE — Progress Notes (Signed)
[facility] [employer] NAME: Adam Nielsen    MR#:  [id number]  DATE OF BIRTH:  [birth year]  SUBJECTIVE:  CHIEF COMPLAINT:   Chief Complaint  Patient presents with  . Fall  . Urinary Retention   - NSTEMI, cardiogenic shock, systolic BP stable now- not on pressors as maintaining MAP - off heparin drip due to hematuria, will need cath - planned for Monday - PVCs and NSVT - on amiodarone drip for afib - hematuria clearing- cardiology suggest to resume heparin drip without bolus and cath on Monday. - today on 12/18/15 after discussing with family, cardiologist came to conclusion of not to do cath- due to his old age and dementia. Stopped amio drip- switch to oral. Pt does not have any complains.   Today cardio suggested to stop heparin drip, and will decide about oral anticoagulation. Need PT eval.  REVIEW OF SYSTEMS:  Review of Systems  Constitutional: Positive for malaise/fatigue. Negative for fever and chills.  HENT: Positive for hearing loss. Negative for ear discharge, ear pain and nosebleeds.   Eyes: Negative for blurred vision, double vision and photophobia.  Respiratory: Positive for shortness of breath. Negative for cough and wheezing.   Cardiovascular: Negative for chest pain, palpitations and leg swelling.  Gastrointestinal: Negative for nausea, vomiting, abdominal pain, diarrhea and constipation.  Genitourinary: Negative for dysuria and hematuria.  Musculoskeletal: Positive for back pain. Negative for myalgias.  Neurological: Negative for dizziness, sensory change, speech change, focal weakness, seizures and headaches.  Psychiatric/Behavioral: Negative for depression. The patient is not nervous/anxious.     DRUG ALLERGIES:  No Known Allergies  VITALS:  Blood pressure 139/71, pulse 87, temperature 98.2 F (36.8 C), temperature source Oral, resp. rate 20, height 5\' 7"  (1.702 m), weight 80.151 kg (176 lb 11.2 oz), SpO2 92  %.  PHYSICAL EXAMINATION:  Physical Exam  GENERAL:  82 y.o.-year-old elderly patient lying in the bed with no acute distress.  EYES: Pupils equal, round, reactive to light and accommodation. No scleral icterus. Extraocular muscles intact. Pale conjunctiva HEENT: Head atraumatic, normocephalic. Oropharynx and nasopharynx clear.  NECK:  Supple, no jugular venous distention. No thyroid enlargement, no tenderness.  LUNGS: Normal breath sounds bilaterally, no wheezing, rales,rhonchi or crepitation. No use of accessory muscles of respiration. Decreased bibasilar breath sounds CARDIOVASCULAR: S1, S2 Rapid and irregular. 3/6 systolic murmur is present. ABDOMEN: Soft, nontender, nondistended. Bowel sounds present. No organomegaly or mass. Foley in place, light orange colour urine. EXTREMITIES: No pedal edema, cyanosis, or clubbing.  NEUROLOGIC: Cranial nerves II through XII are intact. Muscle strength 5/5 in all extremities. Able to move extremities in bed. Sensation intact. Gait not checked.  PSYCHIATRIC: The patient is alert and oriented x 2. Intermittent confusion noted- dementia at baseline SKIN: No obvious rash, lesion, or ulcer.    LABORATORY PANEL:   CBC  Recent Labs Lab 12/20/15 0500  WBC 9.0  HGB 11.1*  HCT 31.5*  PLT 163   ------------------------------------------------------------------------------------------------------------------  Chemistries   Recent Labs Lab 12/14/15 0005  12/19/15 0455 12/20/15 0500  NA 130*  < > 134* 136  K 3.7  < > 2.6* 2.7*  CL 105  < > 108 107  CO2 18*  < > 21* 25  GLUCOSE 128*  < > 118* 139*  BUN 21*  < > 16 15  CREATININE 1.13  < > 1.08 1.06  CALCIUM 8.8*  < > 7.5* 8.0*  MG  --   --  1.9  --   AST 37  --   --   --   ALT 13*  --   --   --   ALKPHOS 52  --   --   --   BILITOT 0.5  --   --   --   < > = values in this interval not  displayed. ------------------------------------------------------------------------------------------------------------------  Cardiac Enzymes  Recent Labs Lab 12/14/15 1547  TROPONINI 3.70*   ------------------------------------------------------------------------------------------------------------------  RADIOLOGY:  No results found.  EKG:   Orders placed or performed during the hospital encounter of 12/13/15  . EKG 12-Lead  . EKG 12-Lead    ASSESSMENT AND PLAN:   82 year old male with past medical history significant for coronary artery disease status post CABG, paroxysmal atrial fibrillation not on anticoagulation, dementia, metastatic prostate cancer status post TURP 2 days ago, urinary retention and bladder outlet obstruction requiring indwelling Foley catheter presents to the hospital secondary to a fall.  #1 NSTEMI-elevated troponin, but steady now. -Could have had suffered an MI in the last couple of days. Currently denies any chest pain. -Heparin drip  Was given for 48 hours -Echocardiogram with EF 35% and wall motion abnormality, no baseline available, monitor on telemetry. -Add metoprolol. Add statin -Aspirin started.- cardio was planning cath initially, but after discussing in detail with son and daughter- decided not to do cath and just monitor with conservative management.  #2 hypotension-cardiogenic shock likely -However due to recent instrumentation and urological procedure 2 days ago. Elevated white count and also lactic acid. -Blood cultures have been ordered. Check urine cultures. -Received 1 dose of vancomycin. Lactic acid improved. Antibiotics changed to Rocephin, ur cx negative. -no need for vasopressors. - now resumed metoprolol.  #3 Acute systolic CHF- hypotensive- fluid overloaded. -Likely ischemic cardiomyopathy. EF is 35%. -Not on diuretics. Monitor IV fluids to prevent pulmonary edema  #4 atrial fibrillation with rapid ventricular response-no  history of paroxysmal atrial fibrillation. was with rapid ventricular rate. -Blood pressure was low normal, so oral medications or IV Cardizem cannot be used. -Given amiodarone drip. Also PVCs -Cardiology help appreciated. -heparin drip has been discontinued initially due to hematuria and hypotension -  resumed heparin without bolus, as hematuria seems resolved. He received heparin for > 48 hours. - switched to oral amio, cont heparin for anticoagulation. - cardio need to guide oral anticoagulation.  #5 BPH-status post TURP done 2 days before this admission at [facility] by urologist. -Continue Flomax. Has a Foley catheter now - also on casodex, proscar -Hematuria is clearing. Hemoglobin is stable. On antibiotics for UTI - I will advise to discharge with Foley until he follows with urologist.  #6 DVT prophylaxis-heparin drip discontinued due to hematuria and hypertension. -Continue to monitor. Teds and SCDs for now  Physical therapy consult. Waiting as pt had MI, and on heparin drip.  -Discussed with son who is healthcare power of attorney, at bedside on admission. - According to patient's wishes, he is a DO NOT RESUSCITATE.   All the records are reviewed and case discussed with Care Management/Social Workerr. Management plans discussed with the patient, family and they are in agreement.  CODE STATUS: DNR  TOTAL TIME SPENT IN TAKING CARE OF THIS PATIENT: 35 minutes.   POSSIBLE D/C IN 2-3 DAYS, DEPENDING ON CLINICAL CONDITION. Spoke to his son in room.  Vaughan Basta M.D on 12/20/2015 at 10:25 AM  Between 7am to 6pm - Pager - [phone]  After 6pm go to [email or web address] - [place]  [facility]  Office  [phone]  CC: Primary care physician; Geoffery Lyons, MD

## 2015-12-20 NOTE — Progress Notes (Addendum)
Patient: Adam Nielsen / Admit Date: 12/13/2015 / Date of Encounter: 12/20/2015, 11:30 AM   Subjective: Pleasantly confused. No family with him today. No acute overnight events. Hgb stable.   Review of Systems: Review of Systems  Unable to perform ROS: dementia    Objective: Telemetry: Afib 60's  Physical Exam: Blood pressure 139/71, pulse 87, temperature 98.2 F (36.8 C), temperature source Oral, resp. rate 20, height 5\' 7"  (1.702 m), weight 176 lb 11.2 oz (80.151 kg), SpO2 92 %. Body mass index is 27.67 kg/(m^2). General: Frail appearing, in no acute distress. Head: Normocephalic, atraumatic, sclera non-icteric, no xanthomas, nares are without discharge. Neck: Negative for carotid bruits. JVP not elevated. Lungs: Clear bilaterally to auscultation without wheezes, rales, or rhonchi. Breathing is unlabored. Heart: Irregularly irregular, S1 S2, II/VI HSM at RUSB, no rubs, or gallops.  Abdomen: Soft, non-tender, non-distended with normoactive bowel sounds. No rebound/guarding. Extremities: No clubbing or cyanosis. No edema. Distal pedal pulses are 2+ and equal bilaterally. Neuro: Alert. Moves all extremities spontaneously. Psych:  Responds to questions appropriately with a normal affect.   Intake/Output Summary (Last 24 hours) at 12/20/15 1130 Last data filed at 12/20/15 0959  Gross per 24 hour  Intake 474.97 ml  Output   2151 ml  Net -1676.03 ml    Inpatient Medications:  . amiodarone  400 mg Oral BID  . antiseptic oral rinse  7 mL Mouth Rinse BID  . aspirin EC  81 mg Oral Daily  . benzonatate  200 mg Oral TID  . bicalutamide  50 mg Oral Daily  . clobetasol ointment  1 application Topical BID  . docusate sodium  100 mg Oral BID  . donepezil  10 mg Oral Daily  . escitalopram  5 mg Oral Daily  . finasteride  5 mg Oral Daily  . furosemide  40 mg Oral Daily  . levothyroxine  50 mcg Oral QAC breakfast  . metoprolol tartrate  12.5 mg Oral BID  . niacin  500 mg Oral Daily   . potassium chloride  10 mEq Intravenous Q1 Hr x 4  . potassium chloride  40 mEq Oral BID  . rosuvastatin  5 mg Oral q1800  . sodium chloride flush  3 mL Intravenous Q12H  . tamsulosin  0.4 mg Oral QPC supper   Infusions:    Labs:  Recent Labs  12/19/15 0455 12/20/15 0500  NA 134* 136  K 2.6* 2.7*  CL 108 107  CO2 21* 25  GLUCOSE 118* 139*  BUN 16 15  CREATININE 1.08 1.06  CALCIUM 7.5* 8.0*  MG 1.9  --    No results for input(s): AST, ALT, ALKPHOS, BILITOT, PROT, ALBUMIN in the last 72 hours.  Recent Labs  12/19/15 1418 12/20/15 0500  WBC 11.4* 9.0  HGB 11.3* 11.1*  HCT 33.3* 31.5*  MCV 91.8 92.3  PLT 175 163   No results for input(s): CKTOTAL, CKMB, TROPONINI in the last 72 hours. Invalid input(s): POCBNP No results for input(s): HGBA1C in the last 72 hours.   Weights: Filed Weights   12/18/15 0500 12/19/15 0326 12/20/15 0437  Weight: 176 lb 3.2 oz (79.924 kg) 176 lb 9.6 oz (80.105 kg) 176 lb 11.2 oz (80.151 kg)     Radiology/Studies:  Dg Chest 2 View  11/22/2015  CLINICAL DATA:  Cough and chest pain EXAM: CHEST  2 VIEW COMPARISON:  12/23/2014 FINDINGS: Postop CABG. Negative for heart failure. Lungs are clear without infiltrate effusion or mass. Unchanged from  the prior study. IMPRESSION: No active cardiopulmonary disease. Electronically Signed   By: Franchot Gallo M.D.   On: 11/22/2015 21:17   Ct Head Wo Contrast  11/22/2015  CLINICAL DATA:  Fall x3, possible UTI EXAM: CT HEAD WITHOUT CONTRAST TECHNIQUE: Contiguous axial images were obtained from the base of the skull through the vertex without intravenous contrast. COMPARISON:  12/23/2014 FINDINGS: No evidence of parenchymal hemorrhage or extra-axial fluid collection. No mass lesion, mass effect, or midline shift. No CT evidence of acute infarction. Extensive subcortical white matter and periventricular small vessel ischemic changes. Intracranial atherosclerosis. Global cortical and central atrophy.  No  ventriculomegaly. Partial opacification of the bilateral ethmoid sinuses. Mucosal thickening with layering fluid in the bilateral maxillary sinuses. Mastoid air cells are clear. Old right lamina papyracea fracture. No evidence of calvarial fracture. IMPRESSION: No evidence of acute intracranial abnormality. Atrophy with small vessel ischemic changes. Electronically Signed   By: Julian Hy M.D.   On: 11/22/2015 21:03     Assessment and Plan   1. NSTEMI/CAD s/p CABG: -No anginal symptoms  -Completed 48 hours of heparin gtt for NSTEMI medical management (started 4/2) and Afib -He has completed 48 hours of heparin at this time, ok to discontinue -Family is less inclined at this time for invasive evaluation  -On aspirin to 81 mg at this time, should he start full-dose anticoagulation will need to stop aspirin  -Continue statin   2. PAF: -Consider long term, full-dose anticoagulation prior to discharge, if ok with MD given stable hgb to date -Could consider Afib ablation as an outpatient, though less likely an option given his comorbidities and age  -Currently rate controlled -Continue PO amiodarone, Lopressor  -CHADS2VASc at least 4 (CHF, age x 2, vascular disease)  3. Hematuria: -CBC with stable hgb at this time -Likely in the setting of recent TURP -Possibly post-traumatic in the setting of Foley catheter as well  4. ICM: -EF 35-40% -He does not appear volume overloaded -Not on Toprol at this time given need for Lopressor for rate control -Continue Lopressor, Lasix -Add low dose lisinopril 2.5 mg daily given cardiomyopathy  5. Hypokalemia: -Has orders for KCl repletion   SignedChristell Faith, PA-C Pager: [phone] 12/20/2015, 11:30 AM    Attending Note Patient seen and examined, agree with detailed note above,  Patient presentation and plan discussed on rounds.   Patient seen this PM, Patient appears comfortable, no complaints Through most of his hospital  stay he has not reported any complaints including shortness of breath or chest discomfort  Clinical exam concerning for Rales at the bases, heart rate irregular but rate within a good range, soft abdomen, no leg edema.  -- No family has been at the bedside to discuss a plan Based on previous discussions, they were not particularly eager to pursue cardiac catheterization. Given his age and memory issues, this may be the best course of action ----If ischemia workup is warranted, pharmacologic Myoview could be ordered ----Would continue gentle Lasix daily given Rales at the bases, suspected acute on chronic systolic and diastolic CHF  ----If family does not want any further testing or imaging such as stress test, no further cardiac workup is needed .   Greater than 50% was spent in counseling and coordination of care with patient Total encounter time 25 minutes or more   Signed: Esmond Plants  M.D., Ph.D. [facility]

## 2015-12-21 MED ORDER — METOPROLOL TARTRATE 25 MG PO TABS: 12.5000 mg | ORAL_TABLET | Freq: Two times a day (BID) | ORAL | Status: DC

## 2015-12-21 MED ORDER — APIXABAN 2.5 MG PO TABS: 2.5000 mg | ORAL_TABLET | Freq: Two times a day (BID) | ORAL | Status: DC

## 2015-12-21 MED ORDER — ROSUVASTATIN CALCIUM 5 MG PO TABS: 5.0000 mg | ORAL_TABLET | Freq: Every day | ORAL | Status: AC

## 2015-12-21 MED ORDER — LISINOPRIL 2.5 MG PO TABS: 2.5000 mg | ORAL_TABLET | Freq: Every day | ORAL | Status: DC

## 2015-12-21 MED ORDER — AMIODARONE HCL 400 MG PO TABS: 400.0000 mg | ORAL_TABLET | Freq: Two times a day (BID) | ORAL | Status: DC

## 2015-12-21 MED ORDER — TAMSULOSIN HCL 0.4 MG PO CAPS: 0.4000 mg | ORAL_CAPSULE | Freq: Every day | ORAL | Status: AC

## 2015-12-21 MED ORDER — FUROSEMIDE 20 MG PO TABS
20.0000 mg | ORAL_TABLET | Freq: Every day | ORAL | Status: DC
Start: 2015-12-22 — End: 2015-12-21

## 2015-12-21 MED ORDER — FUROSEMIDE 20 MG PO TABS: 20.0000 mg | ORAL_TABLET | Freq: Every day | ORAL | Status: DC

## 2015-12-21 NOTE — Progress Notes (Addendum)
Clinical Social Worker was informed that patient will be medically ready to discharge to [facility]. Patient and his son- [name] are in a agreement with plan. CSW called Heather- Admissions Coordinator at [facility] to confirm that patient's bed is ready. Provided patient's room number 610 and number to call for report [phone] . All discharge information faxed to [place] via [place]. DNR added to discharge packet.   RN will call report and patient will discharge to [facility] via his son- [name]. [name] understands that he will need an O2 tank to transport patient.   Ernest Pine, MSW, [place] Social Work Department [phone]

## 2015-12-21 NOTE — Progress Notes (Signed)
Report called to Casey Burkitt at [facility]. Pt son requests to transport patient himself.

## 2015-12-21 NOTE — Progress Notes (Addendum)
CSW spoke to patient's sonAbe People at [phone] and informed him that PT is recommending SNF placement at discharge. Per [name] he feels that patient would benefit from SNF. [place], [place]. CSW updated patient's FL2, added PASRR and faxed out to SNFs in [place] and [place]. Awaiting bed offers.    Heather- Insurance claims handler. She reports that she'll be able to extended a bed offer. CSW informed patient's son [name]. He accepted a bed offer. Informed MD Anselm Jungling.  CSW will continue to follow and assist.   Ernest Pine, MSW, [place] Work Department [phone]

## 2015-12-21 NOTE — Clinical Social Work Placement (Signed)
   CLINICAL SOCIAL WORK PLACEMENT  NOTE  Date:  12/21/2015  Patient Details  Name: Adam Nielsen MRN: [id number] Date of Birth: 1925-08-04  Clinical Social Work is seeking post-discharge placement for this patient at the [place] level of care (*CSW will initial, date and re-position this form in  chart as items are completed):  Yes   Patient/family provided with [place] Work Department's list of facilities offering this level of care within the geographic area requested by the patient (or if unable, by the patient's family).  Yes   Patient/family informed of their freedom to choose among providers that offer the needed level of care, that participate in Medicare, Medicaid or managed care program needed by the patient, have an available bed and are willing to accept the patient.  Yes   Patient/family informed of [place]'s ownership interest in [facility] and [facility], as well as of the fact that they are under no obligation to receive care at these facilities.  PASRR submitted to EDS on 12/21/15     PASRR number received on 12/21/15     Existing PASRR number confirmed on       FL2 transmitted to all facilities in geographic area requested by pt/family on 12/21/15     FL2 transmitted to all facilities within larger geographic area on       Patient informed that his/her managed care company has contracts with or will negotiate with certain facilities, including the following:        Yes   Patient/family informed of bed offers received.  Patient [facility] bed at  [facility])     Physician recommends and patient chooses bed at      Patient to be transferred to  ([facility]) on 12/21/15.  Patient to be transferred to facility by  ([facility])     Patient family notified on 12/21/15 of transfer.  Name of family member notified:   [name]- Son)     PHYSICIAN       Additional Comment:     _______________________________________________ Baldemar Lenis, LCSW 12/21/2015, 12:36 PM

## 2015-12-21 NOTE — Care Management Important Message (Signed)
Important Message  Patient Details  Name: Adam Nielsen MRN: [id number] Date of Birth: 08-26-1925   Medicare Important Message Given:  Yes    [name] 12/21/2015, 10:48 AM

## 2015-12-21 NOTE — Discharge Summary (Signed)
[facility] NAME: Adam Nielsen    MR#:  [id number]  DATE OF BIRTH:  1925-01-05  DATE OF ADMISSION:  12/13/2015 ADMITTING PHYSICIAN: Harrie Foreman, MD  DATE OF DISCHARGE: 12/21/2015  PRIMARY CARE PHYSICIAN: Geoffery Lyons, MD    ADMISSION DIAGNOSIS:  Urinary retention [R33.9] NSTEMI (non-ST elevated myocardial infarction) ([place]) [I21.4] Atrial fibrillation with rapid ventricular response ([place]) [I48.91]  DISCHARGE DIAGNOSIS:  Active Problems:   NSTEMI (non-ST elevated myocardial infarction) (HCC)   Atrial fibrillation with rapid ventricular response (HCC)   S/P TURP   Hematuria   S/P CABG (coronary artery bypass graft)   Hypotension   Typical atrial flutter (HCC)   Urinary retention   Paroxysmal atrial fibrillation (HCC)   Pressure ulcer   SECONDARY DIAGNOSIS:   Past Medical History  Diagnosis Date  . PAF (paroxysmal atrial fibrillation) (HCC)     a. not on long term full dose anticoagulation  . Hypothyroid   . Prostate CA ([place])   . Dementia   . Acute bronchitis 11/22/2015   . Chronic indwelling Foley catheter   . BPH (benign prostatic hypertrophy)   . Hyperlipidemia   . Urinary tract infection 11/22/2015   . Fall     11/22/2015    . CAD (coronary artery disease)     a. s/p CABG approx 1997; b. patient of Dr. Wynonia Lawman, [place]:   #1 NSTEMI-elevated troponin, but steady now. -Could have had suffered an MI in the last couple of days. Currently denies any chest pain. -Heparin drip Was given for 48 hours -Echocardiogram with EF 35% and wall motion abnormality, no baseline available, monitor on telemetry. -Added metoprolol. Added statin -Aspirin started.- cardio was planning cath initially, but after discussing in detail with son and daughter- decided not to do cath and just monitor with conservative management.   As now started on low dose eliquis, hold on aspirin for now, and start aspirin  after 1-2 weeks once no more bleed in urine and foley catheter comes out.  #2 hypotension-cardiogenic shock likely -However due to recent instrumentation and urological procedure 2 days ago. Elevated white count and also lactic acid. -Blood cultures have been ordered. Check urine cultures. -Received 1 dose of vancomycin. Lactic acid improved. Antibiotics changed to Rocephin- finishing course, ur cx negative. -no need for vasopressors. - now resumed metoprolol.  #3 Acute systolic CHF- hypotensive- fluid overloaded. -Likely ischemic cardiomyopathy. EF is 35%. -Not on diuretics. Monitor IV fluids to prevent pulmonary edema - added lisinopril.  #4 atrial fibrillation with rapid ventricular response-no history of paroxysmal atrial fibrillation. was with rapid ventricular rate. -Blood pressure was low normal, so oral medications or IV Cardizem cannot be used. -Given amiodarone drip. Also PVCs -Cardiology help appreciated. -heparin drip has been discontinued initially due to hematuria and hypotension - resumed heparin without bolus, as hematuria seems resolved. He received heparin for > 48 hours. - switched to oral amio, cont heparin for anticoagulation. - oral eliquis started , will give 2.5 mg BID due to old age and recent prostate surgery (82). - after follow up with urologist- may also need to add aspirin.  #5 BPH-status post TURP done 2 days before this admission at [facility] by urologist. -Continue Flomax. Has a Foley catheter now - also on casodex, proscar -Hematuria is clearing. Hemoglobin is stable. On antibiotics for UTI - I will advise to discharge with Foley until he follows with urologist.     #  6 DVT prophylaxis-heparin drip discontinued due to hematuria and hypertension. -Continue to monitor. Teds and SCDs for now  Physical therapy consult. Need SNF  DISCHARGE CONDITIONS:   Stable.  CONSULTS OBTAINED:  Treatment Team:  Minna Merritts, MD  DRUG ALLERGIES:   No Known Allergies  DISCHARGE MEDICATIONS:   Current Discharge Medication List    START taking these medications   Details  amiodarone (PACERONE) 400 MG tablet Take 1 tablet (400 mg total) by mouth 2 (two) times daily. Qty: 60 tablet, Refills: 0    apixaban (ELIQUIS) 2.5 MG TABS tablet Take 1 tablet (2.5 mg total) by mouth 2 (two) times daily. Qty: 60 tablet, Refills: 0    furosemide (LASIX) 20 MG tablet Take 1 tablet (20 mg total) by mouth daily. Qty: 30 tablet, Refills: 0    lisinopril (PRINIVIL,ZESTRIL) 2.5 MG tablet Take 1 tablet (2.5 mg total) by mouth daily. Qty: 30 tablet, Refills: 0    metoprolol tartrate (LOPRESSOR) 25 MG tablet Take 0.5 tablets (12.5 mg total) by mouth 2 (two) times daily. Qty: 30 tablet, Refills: 0    rosuvastatin (CRESTOR) 5 MG tablet Take 1 tablet (5 mg total) by mouth daily at 6 PM. Qty: 30 tablet, Refills: 0    tamsulosin (FLOMAX) 0.4 MG CAPS capsule Take 1 capsule (0.4 mg total) by mouth daily after supper. Qty: 30 capsule, Refills: 0      CONTINUE these medications which have NOT CHANGED   Details  albuterol (PROVENTIL HFA;VENTOLIN HFA) 108 (90 Base) MCG/ACT inhaler Inhale 2 puffs into the lungs every 6 (six) hours as needed for wheezing or shortness of breath. Qty: 1 Inhaler, Refills: 0    alendronate (FOSAMAX) 70 MG tablet Take 70 mg by mouth once a week. Pt takes on Friday.   Take with a full glass of water on an empty stomach.    benzonatate (TESSALON) 200 MG capsule Take 1 capsule (200 mg total) by mouth 3 (three) times daily. Qty: 20 capsule, Refills: 0    bicalutamide (CASODEX) 50 MG tablet Take 50 mg by mouth daily. Reported on 12/13/2015    clobetasol ointment (TEMOVATE) [id number] % Apply 1 application topically 2 (two) times daily.    donepezil (ARICEPT) 10 MG tablet Take 10 mg by mouth daily.     escitalopram (LEXAPRO) 5 MG tablet Take 5 mg by mouth daily.    finasteride (PROSCAR) 5 MG tablet Take 5 mg by mouth daily.     hydrOXYzine (ATARAX/VISTARIL) 25 MG tablet Take 25 mg by mouth every 6 (six) hours as needed for itching.    levothyroxine (SYNTHROID, LEVOTHROID) 25 MCG tablet Take 50 mcg by mouth daily before breakfast.     niacin 500 MG tablet Take 500 mg by mouth daily.     zolpidem (AMBIEN) 5 MG tablet Take 2.5 mg by mouth as needed. For sleep      STOP taking these medications     aspirin EC 325 MG tablet      diltiazem (CARDIZEM) 60 MG tablet      metoprolol succinate (TOPROL-XL) 25 MG 24 hr tablet      RAPAFLO 8 MG CAPS capsule          DISCHARGE INSTRUCTIONS:    Follow with urologist in 1 week and cardiologist in 2 weeks.  If you experience worsening of your admission symptoms, develop shortness of breath, life threatening emergency, suicidal or homicidal thoughts you must seek medical attention immediately by calling 911 or calling your MD immediately  if symptoms less severe.  You Must read complete instructions/literature along with all the possible adverse reactions/side effects for all the Medicines you take and that have been prescribed to you. Take any new Medicines after you have completely understood and accept all the possible adverse reactions/side effects.   Please note  You were cared for by a hospitalist during your hospital stay. If you have any questions about your discharge medications or the care you received while you were in the hospital after you are discharged, you can call the unit and asked to speak with the hospitalist on call if the hospitalist that took care of you is not available. Once you are discharged, your primary care physician will handle any further medical issues. Please note that NO REFILLS for any discharge medications will be authorized once you are discharged, as it is imperative that you return to your primary care physician (or establish a relationship with a primary care physician if you do not have one) for your aftercare needs so that they  can reassess your need for medications and monitor your lab values.    Today   CHIEF COMPLAINT:   Chief Complaint  Patient presents with  . Fall  . Urinary Retention    HISTORY OF PRESENT ILLNESS:  Janet Szwed  is a 82 y.o. male presents to the emergency department from [place] via EMS after a fall. He states that he simply tripped but he cannot provide details of the incident. The fall was unwitnessed. Past medical history is significant for dementia as well as urinary retention. The patient was recently hospitalized for bladder catheterization and bladder training. The catheter was removed prior to discharge but the patient was found to have more than a liter of urine in his bladder upon presentation today. Shortly following decompression of the patient's bladder is noted to have a run of A. fib with rapid ventricular rate. The patient denied chest pain or shortness of breath. He also denies nausea or vomiting but admits to feeling dizzy although it is unclear if the dizziness was associated with this episode or a previous episode. Laboratory work subsequently showed an elevated troponin which prompted the emergency department staff to call for admission.   VITAL SIGNS:  Blood pressure 92/65, pulse 109, temperature 97.6 F (36.4 C), temperature source Oral, resp. rate 18, height 5\' 7"  (D34-534 m), weight 76.703 kg (169 lb 1.6 oz), SpO2 95 %.  I/O:   Intake/Output Summary (Last 24 hours) at 12/21/15 1434 Last data filed at 12/21/15 1130  Gross per 24 hour  Intake    680 ml  Output   1950 ml  Net  -1270 ml    PHYSICAL EXAMINATION:  GENERAL: 82 y.o.-year-old elderly patient lying in the bed with no acute distress.  EYES: Pupils equal, round, reactive to light and accommodation. No scleral icterus. Extraocular muscles intact. Pale conjunctiva HEENT: Head atraumatic, normocephalic. Oropharynx and nasopharynx clear.  NECK: Supple, no jugular venous distention. No  thyroid enlargement, no tenderness.  LUNGS: Normal breath sounds bilaterally, no wheezing, rales,rhonchi or crepitation. No use of accessory muscles of respiration. Decreased bibasilar breath sounds CARDIOVASCULAR: S1, S2 Rapid and irregular. 3/6 systolic murmur is present. ABDOMEN: Soft, nontender, nondistended. Bowel sounds present. No organomegaly or mass. Foley in place, light yellow colour urine. EXTREMITIES: No pedal edema, cyanosis, or clubbing.  NEUROLOGIC: Cranial nerves II through XII are intact. Muscle strength 5/5 in all extremities. Able to move extremities in bed. Sensation intact. Gait not checked.  PSYCHIATRIC: The patient is alert and oriented x 2. Intermittent confusion noted- dementia at baseline SKIN: No obvious rash, lesion, or ulcer.   DATA REVIEW:   CBC  Recent Labs Lab 12/20/15 0500  WBC 9.0  HGB 11.1*  HCT 31.5*  PLT 163    Chemistries   Recent Labs Lab 12/19/15 0455 12/20/15 0500 12/20/15 1442  NA 134* 136  --   K 2.6* 2.7* 3.7  CL 108 107  --   CO2 21* 25  --   GLUCOSE 118* 139*  --   BUN 16 15  --   CREATININE 1.08 1.06  --   CALCIUM 7.5* 8.0*  --   MG 1.9  --   --     Cardiac Enzymes  Recent Labs Lab 12/14/15 1547  TROPONINI 3.70*    Microbiology Results  Results for orders placed or performed during the hospital encounter of 12/13/15  CULTURE, BLOOD (ROUTINE X 2) w Reflex to PCR ID Panel     Status: None   Collection Time: 12/14/15  3:40 PM  Result Value Ref Range Status   Specimen Description BLOOD LEFT ASSIST CONTROL  Final   Special Requests BOTTLES DRAWN AEROBIC AND ANAEROBIC [place]  Final   Culture NO GROWTH 6 DAYS  Final   Report Status 12/20/2015 FINAL  Final  CULTURE, BLOOD (ROUTINE X 2) w Reflex to PCR ID Panel     Status: None   Collection Time: 12/14/15  3:46 PM  Result Value Ref Range Status   Specimen Description BLOOD LEFT HAND  Final   Special Requests BOTTLES DRAWN AEROBIC AND ANAEROBIC  6CCAERO,3CCANA  Final   Culture NO GROWTH 6 DAYS  Final   Report Status 12/20/2015 FINAL  Final  Urine culture     Status: None   Collection Time: 12/15/15 11:39 AM  Result Value Ref Range Status   Specimen Description URINE, CATHETERIZED  Final   Special Requests Normal  Final   Culture NO GROWTH 2 DAYS  Final   Report Status 12/17/2015 FINAL  Final    RADIOLOGY:  No results found.    Management plans discussed with the patient, family and they are in agreement.  CODE STATUS:  DNR    Code Status Orders        Start     Ordered   12/14/15 1307  Do not attempt resuscitation (DNR)   Continuous    Question Answer Comment  In the event of cardiac or respiratory ARREST Do not call a "code blue"   In the event of cardiac or respiratory ARREST Do not perform Intubation, CPR, defibrillation or ACLS   In the event of cardiac or respiratory ARREST Use medication by any route, position, wound care, and other measures to relive pain and suffering. May use oxygen, suction and manual treatment of airway obstruction as needed for comfort.      12/14/15 1306    Code Status History    Date Active Date Inactive Code Status Order ID Comments User Context   12/14/2015  4:26 AM 12/14/2015  1:06 PM Full Code [id number]  Harrie Foreman, MD ED   12/12/2015 12:00 PM 12/13/2015  4:35 PM Full Code [id number]  Kathie Rhodes, MD Inpatient   11/22/2015 11:40 PM 11/25/2015  2:43 PM Full Code [id number]  Lance Coon, MD Inpatient      TOTAL TIME TAKING CARE OF THIS PATIENT: 35 minutes.    Vaughan Basta M.D on 12/21/2015 at 2:34 PM  Between 7am  to 6pm - Pager - [phone]  After 6pm go to [email or web address] - password [email or web address] [place] [facility]  Office  [phone]  CC: Primary care physician; Geoffery Lyons, MD   Note: This dictation was prepared with Dragon dictation along with smaller phrase technology. Any transcriptional errors that result from this process are  unintentional.

## 2015-12-21 NOTE — Progress Notes (Signed)
Patient: Adam Nielsen / Admit Date: 12/13/2015 / Date of Encounter: 12/21/2015, 2:37 PM   Subjective: Pleasantly confused. No family in room today. No acute events. Started on Eliquis 2.5 mg bid. Hgb stable.   Review of Systems: Review of Systems  Unable to perform ROS: dementia    Objective: Telemetry: Afib, 60's Physical Exam: Blood pressure 92/65, pulse 109, temperature 97.6 F (36.4 C), temperature source Oral, resp. rate 18, height 5\' 7"  (1.702 m), weight 169 lb 1.6 oz (76.703 kg), SpO2 95 %. Body mass index is 26.48 kg/(m^2). General: Frail appearing, in no acute distress. Head: Normocephalic, atraumatic, sclera non-icteric, no xanthomas, nares are without discharge. Neck: Negative for carotid bruits. JVP not elevated. Lungs: Clear bilaterally to auscultation without wheezes, rales, or rhonchi. Breathing is unlabored. Heart: Irregularly irregular S1 S2, II/VI HSM at RUSB, no rubs, or gallops.  Abdomen: Soft, non-tender, non-distended with normoactive bowel sounds. No rebound/guarding. Extremities: No clubbing or cyanosis. No edema. Distal pedal pulses are 2+ and equal bilaterally. Neuro: Alert and oriented X 3. Moves all extremities spontaneously. Psych:  Responds to questions appropriately with a normal affect.   Intake/Output Summary (Last 24 hours) at 12/21/15 1437 Last data filed at 12/21/15 1130  Gross per 24 hour  Intake    680 ml  Output   1950 ml  Net  -1270 ml    Inpatient Medications:  . amiodarone  400 mg Oral BID  . antiseptic oral rinse  7 mL Mouth Rinse BID  . apixaban  2.5 mg Oral BID  . aspirin EC  81 mg Oral Daily  . benzonatate  200 mg Oral TID  . bicalutamide  50 mg Oral Daily  . clobetasol ointment  1 application Topical BID  . docusate sodium  100 mg Oral BID  . donepezil  10 mg Oral Daily  . escitalopram  5 mg Oral Daily  . finasteride  5 mg Oral Daily  . [START ON 12/22/2015] furosemide  20 mg Oral Daily  . levothyroxine  50 mcg Oral QAC  breakfast  . lisinopril  2.5 mg Oral Daily  . metoprolol tartrate  12.5 mg Oral BID  . niacin  500 mg Oral Daily  . potassium chloride  40 mEq Oral BID  . rosuvastatin  5 mg Oral q1800  . sodium chloride flush  3 mL Intravenous Q12H  . tamsulosin  0.4 mg Oral QPC supper   Infusions:    Labs:  Recent Labs  12/19/15 0455 12/20/15 0500 12/20/15 1442  NA 134* 136  --   K 2.6* 2.7* 3.7  CL 108 107  --   CO2 21* 25  --   GLUCOSE 118* 139*  --   BUN 16 15  --   CREATININE 1.08 1.06  --   CALCIUM 7.5* 8.0*  --   MG 1.9  --   --    No results for input(s): AST, ALT, ALKPHOS, BILITOT, PROT, ALBUMIN in the last 72 hours.  Recent Labs  12/19/15 1418 12/20/15 0500  WBC 11.4* 9.0  HGB 11.3* 11.1*  HCT 33.3* 31.5*  MCV 91.8 92.3  PLT 175 163   No results for input(s): CKTOTAL, CKMB, TROPONINI in the last 72 hours. Invalid input(s): POCBNP No results for input(s): HGBA1C in the last 72 hours.   Weights: Filed Weights   12/19/15 0326 12/20/15 0437 12/21/15 0600  Weight: 176 lb 9.6 oz (80.105 kg) 176 lb 11.2 oz (80.151 kg) 169 lb 1.6 oz (76.703  kg)     Radiology/Studies:  Dg Chest 2 View  11/22/2015  CLINICAL DATA:  Cough and chest pain EXAM: CHEST  2 VIEW COMPARISON:  12/23/2014 FINDINGS: Postop CABG. Negative for heart failure. Lungs are clear without infiltrate effusion or mass. Unchanged from the prior study. IMPRESSION: No active cardiopulmonary disease. Electronically Signed   By: Franchot Gallo M.D.   On: 11/22/2015 21:17   Ct Head Wo Contrast  11/22/2015  CLINICAL DATA:  Fall x3, possible UTI EXAM: CT HEAD WITHOUT CONTRAST TECHNIQUE: Contiguous axial images were obtained from the base of the skull through the vertex without intravenous contrast. COMPARISON:  12/23/2014 FINDINGS: No evidence of parenchymal hemorrhage or extra-axial fluid collection. No mass lesion, mass effect, or midline shift. No CT evidence of acute infarction. Extensive subcortical white matter and  periventricular small vessel ischemic changes. Intracranial atherosclerosis. Global cortical and central atrophy.  No ventriculomegaly. Partial opacification of the bilateral ethmoid sinuses. Mucosal thickening with layering fluid in the bilateral maxillary sinuses. Mastoid air cells are clear. Old right lamina papyracea fracture. No evidence of calvarial fracture. IMPRESSION: No evidence of acute intracranial abnormality. Atrophy with small vessel ischemic changes. Electronically Signed   By: Julian Hy M.D.   On: 11/22/2015 21:03     Assessment and Plan   1. NSTEMI/CAD s/p CABG: -No anginal symptoms  -Completed 48 hours of heparin gtt for NSTEMI medical management (started 4/2) and Afib -He has completed 48 hours of heparin at this time, ok to discontinue -Family is less inclined at this time for invasive evaluation (I concur) -No plans for ischemic evaluation at this time per family wishes and patient's comorbities -On Eliquis in place of aspirin  -Continue statin   2. PAF: -Eliquis 2.5 mg bid -Could consider Afib ablation as an outpatient, though less likely an option given his comorbidities and age  -Currently rate controlled -Continue PO amiodarone, Lopressor  -CHADS2VASc at least 4 (CHF, age x 2, vascular disease)  3. Hematuria: -CBC with stable hgb at this time -Likely in the setting of recent TURP -Possibly post-traumatic in the setting of Foley catheter as well  4. ICM: -EF 35-40% -He does not appear volume overloaded -Not on Toprol at this time given need for Lopressor for rate control -Continue Lopressor, Lasix -Add low dose lisinopril 2.5 mg daily given cardiomyopathy  5. Hypokalemia: -Has orders for KCl repletion    SignedChristell Faith, PA-C Pager: [phone] 12/21/2015, 2:37 PM   I have seen, examined and evaluated the patient this Morning along with Mr. Purcell Mouton.  After reviewing all the available data and chart,  I agree with his findings,  examination as well as impression recommendations.  Mr. Minassian looks pretty good today. His volume level appears to be stable. We have reduced his Lasix to 20 mg. I would prefer at this particular point to use Lopressor until we know what made using a be on instead of Toprol, however as an outpatient he would eventually end up back on Toprol.  After long discussion this weekend with his daughter, we came to the conclusion that we would prefer not to proceed with invasive evaluation given his improved symptoms, and baseline dementia. The patient himself was not excited about having an invasive procedure, and I think it would be the best option to avoid invasive therapies for now.  He has not had any further anginal symptoms with rest or exertion. Rate is relatively well-controlled with combination of beta blocker and amiodarone.  He is  now on low-dose anticoagulation. In the outpatient setting, if he is not having any more bleeding issues, we can go back to the full dose of ELIQUIS.  At this point I think is probably stable if medically stable for discharge to a nursing facility is my understanding.  Thank you for involving [place] in the care of this pleasant elderly gentleman. He will be scheduled for follow-up in our office.    Leonie Man, M.D., M.S. Interventional Cardiologist   Pager # [phone] Phone # [phone] [address] [place], [place] [address]

## 2015-12-21 NOTE — Discharge Instructions (Signed)
Recent Prostate surgery- d/c with foley to rehab.   Need to folow with urologist in 1 week.  If no bleed in urine and foley comes out- may need to start on aspirin for NSTEMI and CAD.

## 2015-12-21 NOTE — Progress Notes (Signed)
Patient discharged via wheelchair and private vehicle. IV's removed and catheter intact. All discharge instructions given and patient verbalizes understanding. Tele removed and returned. prescriptions given to patient No distress noted.

## 2015-12-22 ENCOUNTER — Telehealth: Payer: Self-pay | Admitting: *Deleted

## 2015-12-22 NOTE — Telephone Encounter (Signed)
Patient contacted regarding discharge from [facility] on 12/21/15.  Patient understands to follow up with provider Christell Faith on 12/21/15 at 2:00PM at [facility]. Patient understands discharge instructions? Patient is in rehab facility Patient understands medications and regiment? Patient is in rehab facility Patient understands to bring all medications to this visit? Patient in facility and son states they are managing his medications  Spoke to patients son and he stated that patient is very weak and requires 2 people to get in the car. His father is currently at [facility] for rehabilitation and once finished there he will go back to [facility]. His son was not sure about the follow up appointment but did verbalize understanding to call if we need to cancel or reschedule. He had no further questions at this time and let him know to call us if we can assist him in any way.

## 2015-12-22 NOTE — Telephone Encounter (Signed)
-----   Message from Arie Sabina sent at 12/21/2015  3:14 PM EDT ----- Regarding: tcm/ph R. Dunn  01/05/16  2:00

## 2016-01-05 ENCOUNTER — Encounter (INDEPENDENT_AMBULATORY_CARE_PROVIDER_SITE_OTHER): Payer: Self-pay

## 2016-01-05 ENCOUNTER — Ambulatory Visit (INDEPENDENT_AMBULATORY_CARE_PROVIDER_SITE_OTHER): Payer: Medicare Other | Admitting: Physician Assistant

## 2016-01-05 ENCOUNTER — Encounter: Payer: Self-pay | Admitting: Physician Assistant

## 2016-01-05 VITALS — BP 98/46 | HR 54 | Ht 67.0 in | Wt 162.2 lb

## 2016-01-05 DIAGNOSIS — I2510 Atherosclerotic heart disease of native coronary artery without angina pectoris: Secondary | ICD-10-CM

## 2016-01-05 DIAGNOSIS — Z9981 Dependence on supplemental oxygen: Secondary | ICD-10-CM | POA: Diagnosis not present

## 2016-01-05 DIAGNOSIS — I214 Non-ST elevation (NSTEMI) myocardial infarction: Secondary | ICD-10-CM

## 2016-01-05 DIAGNOSIS — I480 Paroxysmal atrial fibrillation: Secondary | ICD-10-CM

## 2016-01-05 DIAGNOSIS — J9611 Chronic respiratory failure with hypoxia: Secondary | ICD-10-CM

## 2016-01-05 DIAGNOSIS — I255 Ischemic cardiomyopathy: Secondary | ICD-10-CM

## 2016-01-05 DIAGNOSIS — Z87448 Personal history of other diseases of urinary system: Secondary | ICD-10-CM

## 2016-01-05 DIAGNOSIS — I4581 Long QT syndrome: Secondary | ICD-10-CM

## 2016-01-05 DIAGNOSIS — I4891 Unspecified atrial fibrillation: Secondary | ICD-10-CM | POA: Diagnosis not present

## 2016-01-05 DIAGNOSIS — R001 Bradycardia, unspecified: Secondary | ICD-10-CM

## 2016-01-05 DIAGNOSIS — F0390 Unspecified dementia without behavioral disturbance: Secondary | ICD-10-CM

## 2016-01-05 DIAGNOSIS — R9431 Abnormal electrocardiogram [ECG] [EKG]: Secondary | ICD-10-CM

## 2016-01-05 MED ORDER — AMIODARONE HCL 200 MG PO TABS: 200.0000 mg | ORAL_TABLET | Freq: Every day | ORAL | Status: DC

## 2016-01-05 NOTE — Patient Instructions (Addendum)
Medication Instructions:  Please hold your amiodarone for 1 week Then start taking 200 mg once daily  Labwork: CBC, BMET  Testing/Procedures: None  Follow-Up: Your physician recommends that you schedule a follow-up appointment in: 1 month with Dr. Rockey Situ  Date & time: ___________________  We are referring you to pulmonology to discuss your oxygen  Date & time:_____________________  If you need a refill on your cardiac medications before your next appointment, please call your pharmacy.

## 2016-01-05 NOTE — Progress Notes (Signed)
Cardiology Office Note Date:  01/05/2016  Patient ID:  Adam Nielsen, Adam Nielsen Jan 29, 1925, MRN [id number] PCP:  Geoffery Lyons, MD  Cardiologist:  Dr. Rockey Situ, MD    Chief Complaint: Hospital follow up  History of Present Illness: Adam Nielsen is a 82 y.o. male with history of CAD s/p 4 or 5 vessel CABG approximately 1997 with no ischemic evaluations since, newly diagnosed chronic respiratory failure with hypoxia on home oxygen via nasal cannula, PAF started on Eliquis 2.5 mg bid this past admission, dementia, metastatic prostate cancer s/p TURP 12/12/2015, BPH, urinary retention with bladder outlet obstruction requiring prior indwelling Foley catheter now voiding on his own, previous history of hematuria, and recently diagnosed ICM who presents for hospital follow up of recent NSTEMI medically managed with IV heparin.  Patient previously followed by Dr. Wynonia Lawman, MD, though had not seen him in years. He has PAF of unknown duration and was not previously on full-dose anticoagulation prior to the above admission. There was numerous notes indicating possible DAPT with aspirin and Plavix at the time of his above admission; however upon talking with his son it was discovered he was only taking aspirin alone. Patient was living on his own up until 09/2015. Marland Kitchen At that time it was discovered he was becoming more forgetful and not taking his medications as directed. At that time he was placed in [facility]. His cognitive function continues to decline. He recently underwent TURP at [facility] on 3/27 without issues. He was discharged back to [facility]. On 3/28 he was standing at his doorway at his room when he suddenly felt a "jolt." he next reported being found down on the ground by the staff that works there. Upon his arrival at [facility] he was found to have an elevated troponin with a peak of 4.80, CK 408, hgb 11.9, which was stable. He noted significant hematuria in the setting of his recent  TURP/Foley. EKG showed lateral st depression. There was discussion regarding cardiac vs no invasive work up. Ultimately, it was decided to manage medically given his dementia and other comorbidities with IV heparin gtt. Echo showed EF 35-40%, HK of the anterior and anteroseptal wall, basal to mid inferior/posterior wall HK. Mild MR, PASP 38 mm Hg. He was started on amiodarone and Eliquis during admission for his Afib. CBC remained stable.    Since his discharge he has done well. He is preparing to go back to [facility] 01/06/2016. He continues to work with PT. He remains on oxygen therapy. I received BP log from nurse at rehab that showed stable BP with range from 1-teens systolic to [id number] systolic. He unfortunately has remained on amiodarone 400 mg bid throughout his admission and since his discharge, and is on this today. He is also on Lopressor 12.5 mg bid. He saw Urology, passed voiding trial and no longer has a Foley in place.    Past Medical History  Diagnosis Date  . PAF (paroxysmal atrial fibrillation) (HCC)     a. not on long term full dose anticoagulation  . Hypothyroid   . Prostate CA ([place])   . Dementia   . Acute bronchitis 11/22/2015   . Chronic indwelling Foley catheter   . BPH (benign prostatic hypertrophy)   . Hyperlipidemia   . Urinary tract infection 11/22/2015   . Fall     11/22/2015    . CAD (coronary artery disease)     a. s/p CABG approx 1997; b. patient of Dr. Wynonia Lawman,  MD    Past Surgical History  Procedure Laterality Date  . Coronary artery bypass graft    . Transurethral resection of prostate N/A 12/12/2015    Procedure: TRANSURETHRAL RESECTION OF THE PROSTATE ;  Surgeon: Kathie Rhodes, MD;  Location: [facility];  Service: Urology;  Laterality: N/A;    Current Outpatient Prescriptions  Medication Sig Dispense Refill  . albuterol (PROVENTIL HFA;VENTOLIN HFA) 108 (90 Base) MCG/ACT inhaler Inhale 2 puffs into the lungs every 6 (six) hours as needed for wheezing or shortness  of breath. 1 Inhaler 0  . amiodarone (PACERONE) 200 MG tablet Take 1 tablet (200 mg total) by mouth daily. 90 tablet 3  . apixaban (ELIQUIS) 2.5 MG TABS tablet Take 1 tablet (2.5 mg total) by mouth 2 (two) times daily. 60 tablet 0  . benzonatate (TESSALON) 200 MG capsule Take 1 capsule (200 mg total) by mouth 3 (three) times daily. 20 capsule 0  . bicalutamide (CASODEX) 50 MG tablet Take 50 mg by mouth daily. Reported on 12/13/2015    . clobetasol ointment (TEMOVATE) [id number] % Apply 1 application topically 2 (two) times daily.    . divalproex (DEPAKOTE) 125 MG DR tablet Take 125 mg by mouth 2 (two) times daily.     Marland Kitchen donepezil (ARICEPT) 10 MG tablet Take 10 mg by mouth daily.     Marland Kitchen donepezil (ARICEPT) 5 MG tablet Take 5 mg by mouth at bedtime.    Marland Kitchen escitalopram (LEXAPRO) 5 MG tablet Take 5 mg by mouth daily.    . finasteride (PROSCAR) 5 MG tablet Take 5 mg by mouth daily.    . furosemide (LASIX) 20 MG tablet Take 1 tablet (20 mg total) by mouth daily. 30 tablet 0  . hydrOXYzine (ATARAX/VISTARIL) 25 MG tablet Take 25 mg by mouth every 6 (six) hours as needed for itching.    . levothyroxine (SYNTHROID, LEVOTHROID) 25 MCG tablet Take 50 mcg by mouth daily before breakfast.     . lisinopril (PRINIVIL,ZESTRIL) 2.5 MG tablet Take 1 tablet (2.5 mg total) by mouth daily. 30 tablet 0  . metoprolol tartrate (LOPRESSOR) 25 MG tablet Take 0.5 tablets (12.5 mg total) by mouth 2 (two) times daily. 30 tablet 0  . niacin 500 MG tablet Take 500 mg by mouth daily.     . rosuvastatin (CRESTOR) 5 MG tablet Take 1 tablet (5 mg total) by mouth daily at 6 PM. 30 tablet 0  . tamsulosin (FLOMAX) 0.4 MG CAPS capsule Take 1 capsule (0.4 mg total) by mouth daily after supper. 30 capsule 0  . zolpidem (AMBIEN) 5 MG tablet Take 2.5 mg by mouth as needed. For sleep     No current facility-administered medications for this visit.    Allergies:   Review of patient's allergies indicates no known allergies.   Social History:   The patient  reports that he has quit smoking. He does not have any smokeless tobacco history on file. He reports that he does not drink alcohol or use illicit drugs.   Family History:  The patient's family history includes Cancer in his brother; Heart disease in his father.  ROS:   Review of Systems  Constitutional: Positive for weight loss and malaise/fatigue. Negative for fever, chills and diaphoresis.       Weight loss of 7 pounds since hospital d/c  HENT: Negative for congestion.   Eyes: Negative for discharge and redness.  Respiratory: Positive for shortness of breath. Negative for cough, sputum production and wheezing.   Cardiovascular:  Negative for chest pain, palpitations, orthopnea, claudication, leg swelling and PND.  Gastrointestinal: Negative for nausea and vomiting.  Musculoskeletal: Negative for falls.  Skin: Negative for rash.  Neurological: Positive for weakness. Negative for sensory change, speech change, focal weakness and loss of consciousness.  Endo/Heme/Allergies: Does not bruise/bleed easily.  Psychiatric/Behavioral: The patient is not nervous/anxious.      PHYSICAL EXAM:  VS:  BP 98/46 mmHg  Pulse 54  Ht 5\' 7"  (1.702 m)  Wt 162 lb 4 oz (73.596 kg)  BMI 25.41 kg/m2 BMI: Body mass index is 25.41 kg/(m^2). Well nourished, well developed, in no acute distress HEENT: normocephalic, atraumatic Neck: no JVD, carotid bruits or masses Cardiac:  normal S1, S2; RRR; II/VI systolic murmurs at apex, no rubs, or gallops Lungs:  clear to auscultation bilaterally, no wheezing, rhonchi or rales. On nasal cannula Abd: soft, nontender, no hepatomegaly, + BS MS: no deformity or atrophy Ext: no edema Skin: warm and dry, no rash Neuro:  moves all extremities spontaneously, no focal abnormalities noted, follows commands Psych: euthymic mood, full affect, pleasantly demented    EKG:  Was ordered today. Shows sinus bradycardia with 1st degree AV block (PR inteval 254 msec), 54  bpm, prolonged QTC 570 msec, inferior Q waves, no acute st/t changes  Recent Labs: 12/14/2015: ALT 13*; TSH 2.581 12/19/2015: Magnesium 1.9 12/20/2015: BUN 15; Creatinine, Ser 1.06; Hemoglobin 11.1*; Platelets 163; Potassium 3.7; Sodium 136  12/15/2015: Cholesterol 128; HDL 27*; LDL Cholesterol 76; Total CHOL/HDL Ratio 4.7; Triglycerides 126; VLDL 25   Estimated Creatinine Clearance: 44.2 mL/min (by C-G formula based on Cr of 1.06).   Wt Readings from Last 3 Encounters:  01/05/16 162 lb 4 oz (73.596 kg)  12/21/15 169 lb 1.6 oz (76.703 kg)  12/12/15 156 lb 9.6 oz (71.033 kg)     Other studies reviewed: Additional studies/records reviewed today include: summarized above  ASSESSMENT AND PLAN:  1. CAD s/p CABG as above s/p recent NSTEMI medically managed as above: No symptoms concerning for angina. No plans for ischemic evaluations at this time per family and patient wishes. Continue Lopressor 12.5 mg bid, with plans to change to Toprol XL in the future as BP and HR allow. Continue Crestor.  2. ICM: HE does not appear to be volume overloaded at this time. Continue Lasix 20 mg daily for now. Give his soft BP today, there is concern he may need every other day dosing at this time. Lopressor as above with plans to change as above. BP is too soft for titration of BB as well as addition of ACEi/ARB/Entresto at this time. Add as vital signs allow. Consider spiro. 3. Chronic respiratory failure: Stable. Patient's daughter asks about discontinuation of O2. This will need to be handled by pulmonary medicine. Referral made.  4. PAF: He is currently in sinus rhythm with a bradycardic rate in the 50's bpm range in the setting of prolonged amiodarone 400 mg bid consumption. He will hold amiodarone for the next week, then restart 200 mg daily thereafter. If CBC demonstrates a stable hgb would increase Eliquis to 5 mg bid followed by rechecking of hgb 1 week after. CHADS2VASc at least 4 (CHF, age x 2, vascular  disease). 5. Prolonged QTc: Secondary to amiodarone as above.  6. Dementia: Stable 7. Prostate cancer s/p TURP with history of hematuria: Per urology.   Disposition: F/u with Dr. Rockey Situ, MD in 1 month  Current medicines are reviewed at length with the patient today.  The patient did not  have any concerns regarding medicines.  Melvern Banker PA-C 01/05/2016 3:11 PM     [address] [place], [place] [address] [phone]

## 2016-01-06 ENCOUNTER — Telehealth: Payer: Self-pay | Admitting: Physician Assistant

## 2016-01-06 LAB — BASIC METABOLIC PANEL

## 2016-01-06 LAB — CBC WITH DIFFERENTIAL/PLATELET
BASOS ABS: 0 10*3/uL (ref 0.0–0.2)
Basos: 0 %
EOS (ABSOLUTE): 0.2 10*3/uL (ref 0.0–0.4)
Eos: 4 %
HEMATOCRIT: 33.2 % — AB (ref 37.5–51.0)
Hemoglobin: 11 g/dL — ABNORMAL LOW (ref 12.6–17.7)
IMMATURE GRANULOCYTES: 0 %
Immature Grans (Abs): 0 10*3/uL (ref 0.0–0.1)
Lymphocytes Absolute: 1.2 10*3/uL (ref 0.7–3.1)
Lymphs: 19 %
MCH: 30.6 pg (ref 26.6–33.0)
MCHC: 33.1 g/dL (ref 31.5–35.7)
MCV: 92 fL (ref 79–97)
MONOS ABS: 0.3 10*3/uL (ref 0.1–0.9)
Monocytes: 5 %
NEUTROS PCT: 72 %
Neutrophils Absolute: 4.3 10*3/uL (ref 1.4–7.0)
PLATELETS: 245 10*3/uL (ref 150–379)
RBC: 3.6 x10E6/uL — AB (ref 4.14–5.80)
RDW: 15.2 % (ref 12.3–15.4)
WBC: 5.9 10*3/uL (ref 3.4–10.8)

## 2016-01-06 NOTE — Telephone Encounter (Signed)
Spoke w/ pt's daughter.  Advised her that we cannot refill O2, she will need to contact pulmonologist or hospitalist for this. She verbalizes understanding and will call # provided on discharge instructions.  Asked her to call back if we can be of further assistance.

## 2016-01-06 NOTE — Telephone Encounter (Signed)
Please see note below. 

## 2016-01-06 NOTE — Telephone Encounter (Signed)
*  STAT* If patient is at the pharmacy, call can be transferred to refill team.   1. Which medications need to be refilled? (please list name of each medication and dose if known) Oxygen tank   2. Which pharmacy/location (including street and city if local pharmacy) is medication to be sent to? [facility]  3. Do they need a 30 day or 90 day supply? Just a month's worth.

## 2016-01-10 ENCOUNTER — Telehealth: Payer: Self-pay | Admitting: Cardiovascular Disease

## 2016-01-10 NOTE — Telephone Encounter (Signed)
Spoke w/ [employer] @ [employer].  Advised her that orders at ov 01/05/16 state:  "Please hold your amiodarone for 1 week Then start taking 200 mg once daily"  If pt stopped amiodarone on 4/20, he should resume on 01/12/16 (or 7 days after last dose). This message routed to [facility] @ [phone].

## 2016-01-10 NOTE — Telephone Encounter (Signed)
[facility] nurse calling asking from [place] ordered to hold Amiodarone and to restart on 06/10/16  Would like to know if that was suppose to be 01/12/16 Please call back.

## 2016-01-11 ENCOUNTER — Emergency Department
Admission: EM | Admit: 2016-01-11 | Discharge: 2016-01-11 | Disposition: A | Discharge status: Home / Self Care | Payer: Medicare Other | Attending: Emergency Medicine | Admitting: Emergency Medicine

## 2016-01-11 ENCOUNTER — Emergency Department: Payer: Medicare Other

## 2016-01-11 DIAGNOSIS — I480 Paroxysmal atrial fibrillation: Secondary | ICD-10-CM | POA: Diagnosis not present

## 2016-01-11 DIAGNOSIS — Y939 Activity, unspecified: Secondary | ICD-10-CM | POA: Diagnosis not present

## 2016-01-11 DIAGNOSIS — E785 Hyperlipidemia, unspecified: Secondary | ICD-10-CM | POA: Diagnosis not present

## 2016-01-11 DIAGNOSIS — I2510 Atherosclerotic heart disease of native coronary artery without angina pectoris: Secondary | ICD-10-CM | POA: Insufficient documentation

## 2016-01-11 DIAGNOSIS — Y999 Unspecified external cause status: Secondary | ICD-10-CM | POA: Insufficient documentation

## 2016-01-11 DIAGNOSIS — S6991XA Unspecified injury of right wrist, hand and finger(s), initial encounter: Secondary | ICD-10-CM | POA: Diagnosis present

## 2016-01-11 DIAGNOSIS — E039 Hypothyroidism, unspecified: Secondary | ICD-10-CM | POA: Diagnosis not present

## 2016-01-11 DIAGNOSIS — Y929 Unspecified place or not applicable: Secondary | ICD-10-CM | POA: Insufficient documentation

## 2016-01-11 DIAGNOSIS — S61411A Laceration without foreign body of right hand, initial encounter: Secondary | ICD-10-CM | POA: Diagnosis not present

## 2016-01-11 DIAGNOSIS — Z87891 Personal history of nicotine dependence: Secondary | ICD-10-CM | POA: Diagnosis not present

## 2016-01-11 DIAGNOSIS — W07XXXA Fall from chair, initial encounter: Secondary | ICD-10-CM | POA: Insufficient documentation

## 2016-01-11 DIAGNOSIS — W19XXXA Unspecified fall, initial encounter: Secondary | ICD-10-CM

## 2016-01-11 MED ORDER — ACETAMINOPHEN 325 MG PO TABS
ORAL_TABLET | ORAL | Status: AC
  Administered 2016-01-11: 650 mg via ORAL
  Filled 2016-01-11: qty 2

## 2016-01-11 MED ORDER — ACETAMINOPHEN 325 MG PO TABS
650.0000 mg | ORAL_TABLET | Freq: Once | ORAL | Status: AC
  Administered 2016-01-11: 650 mg via ORAL

## 2016-01-11 NOTE — ED Notes (Signed)
Pt bib EMS w/ c/o mechanical fall.  Pt sts he was reaching for urinal when he slid out of chair.  Pt denies LOC, head injury, dizziness, n/v/d, SOB or CP.  Pt A/Ox3.  Pt has injury/skin tear to R hand. Bleeding controlled.

## 2016-01-11 NOTE — Discharge Instructions (Signed)
Please keep this area clean. Change dressing once daily, cover with a Vaseline infused gauze to prevent sticking. Please follow-up with your primary care physician in 2-3 days for recheck of your wound.   Skin Tear Care A skin tear is when the top layer of skin peels off. To repair the skin, your doctor may use:   Tape.  Skin adhesive strips. HOME CARE  Change bandages (dressings) once a day or as told by your doctor.  Gently clean the area with salt (saline) solution or with a mild soap and water.  Do not rub the injured skin dry. Let the area air dry.  Put petroleum jelly or antibiotic cream on the tear. Do not allow a scab to form.  If the bandage sticks, moisten it with warm soapy water and remove it.  Protect the injured skin until it has healed.  Only take medicine as told by your doctor.  Take showers or baths using warm soapy water. Apply a new bandage after the shower or bath.  Keep all doctor visits as told. GET HELP RIGHT AWAY IF:   You have redness, puffiness (swelling), or more pain in the tear.  You have ayellowish-white fluid (pus) coming from the tear.  You have chills.  You have a red streak that goes away from the tear.  You have a bad smell coming from the tear or bandage.  You have a fever or lasting symptoms for more than 2-3 days.  You have a fever and your symptoms suddenly get worse. MAKE SURE YOU:   Understand these instructions.  Will watch this condition.  Will get help right away if you are not doing well or get worse.   This information is not intended to replace advice given to you by your health care provider. Make sure you discuss any questions you have with your health care provider.   Document Released: 06/12/2008 Document Revised: 05/28/2012 Document Reviewed: 03/17/2012 Elsevier Interactive Patient Education [employer].

## 2016-01-11 NOTE — ED Notes (Signed)
Patient is off floor for imaging.

## 2016-01-11 NOTE — ED Provider Notes (Signed)
[facility] Emergency Department Provider Note  Time seen: 6:16 PM  I have reviewed the triage vital signs and the nursing notes.   HISTORY  Chief Complaint Fall    HPI Adam Nielsen is a 82 y.o. male with a past medical history of hypothyroidism, dementia, hyperlipidemia, presents to department for a fall. Per EMS, per nursing home report patient had a fall, no LOC. Currently patient's only complaint is pain to his right hand for which he has a moderate sized skin tear. Denies any other pain. States he remembers the fall and denies hitting his head, denies LOC. Denies vomiting.    Past Medical History  Diagnosis Date  . PAF (paroxysmal atrial fibrillation) (HCC)     a. not on long term full dose anticoagulation  . Hypothyroid   . Prostate CA ([place])   . Dementia   . Acute bronchitis 11/22/2015   . Chronic indwelling Foley catheter   . BPH (benign prostatic hypertrophy)   . Hyperlipidemia   . Urinary tract infection 11/22/2015   . Fall     11/22/2015    . CAD (coronary artery disease)     a. s/p CABG approx 1997; b. patient of Dr. Wynonia Lawman, MD    Patient Active Problem List   Diagnosis Date Noted  . Pressure ulcer 12/18/2015  . Urinary retention   . Paroxysmal atrial fibrillation (HCC)   . Typical atrial flutter ([place])   . NSTEMI (non-ST elevated myocardial infarction) ([place]) 12/14/2015  . Atrial fibrillation with rapid ventricular response ([place])   . S/P TURP   . Hematuria   . S/P CABG (coronary artery bypass graft)   . Hypotension   . BPH (benign prostatic hypertrophy) with urinary retention 12/12/2015  . UTI (urinary tract infection) 11/23/2015  . UTI (lower urinary tract infection) 11/22/2015  . Prostate cancer ([place]) 11/22/2015  . Hypothyroidism 11/22/2015  . Atrial fibrillation ([place]) 11/22/2015  . Dementia 11/22/2015  . Fall 11/22/2015  . Bronchitis 11/22/2015    Past Surgical History  Procedure Laterality Date  . Coronary artery bypass  graft    . Transurethral resection of prostate N/A 12/12/2015    Procedure: TRANSURETHRAL RESECTION OF THE PROSTATE ;  Surgeon: Kathie Rhodes, MD;  Location: [facility];  Service: Urology;  Laterality: N/A;    Current Outpatient Rx  Name  Route  Sig  Dispense  Refill  . albuterol (PROVENTIL HFA;VENTOLIN HFA) 108 (90 Base) MCG/ACT inhaler   Inhalation   Inhale 2 puffs into the lungs every 6 (six) hours as needed for wheezing or shortness of breath.   1 Inhaler   0   . amiodarone (PACERONE) 200 MG tablet   Oral   Take 1 tablet (200 mg total) by mouth daily.   90 tablet   3   . apixaban (ELIQUIS) 2.5 MG TABS tablet   Oral   Take 1 tablet (2.5 mg total) by mouth 2 (two) times daily.   60 tablet   0   . benzonatate (TESSALON) 200 MG capsule   Oral   Take 1 capsule (200 mg total) by mouth 3 (three) times daily.   20 capsule   0   . bicalutamide (CASODEX) 50 MG tablet   Oral   Take 50 mg by mouth daily. Reported on 12/13/2015         . clobetasol ointment (TEMOVATE) 0.05 %   Topical   Apply 1 application topically 2 (two) times daily.         Marland Kitchen  divalproex (DEPAKOTE) 125 MG DR tablet   Oral   Take 125 mg by mouth 2 (two) times daily.          Marland Kitchen donepezil (ARICEPT) 10 MG tablet   Oral   Take 10 mg by mouth daily.          Marland Kitchen donepezil (ARICEPT) 5 MG tablet   Oral   Take 5 mg by mouth at bedtime.         Marland Kitchen escitalopram (LEXAPRO) 5 MG tablet   Oral   Take 5 mg by mouth daily.         . finasteride (PROSCAR) 5 MG tablet   Oral   Take 5 mg by mouth daily.         . furosemide (LASIX) 20 MG tablet   Oral   Take 1 tablet (20 mg total) by mouth daily.   30 tablet   0   . hydrOXYzine (ATARAX/VISTARIL) 25 MG tablet   Oral   Take 25 mg by mouth every 6 (six) hours as needed for itching.         . levothyroxine (SYNTHROID, LEVOTHROID) 25 MCG tablet   Oral   Take 50 mcg by mouth daily before breakfast.          . lisinopril (PRINIVIL,ZESTRIL) 2.5 MG  tablet   Oral   Take 1 tablet (2.5 mg total) by mouth daily.   30 tablet   0   . metoprolol tartrate (LOPRESSOR) 25 MG tablet   Oral   Take 0.5 tablets (12.5 mg total) by mouth 2 (two) times daily.   30 tablet   0   . niacin 500 MG tablet   Oral   Take 500 mg by mouth daily.          . rosuvastatin (CRESTOR) 5 MG tablet   Oral   Take 1 tablet (5 mg total) by mouth daily at 6 PM.   30 tablet   0   . tamsulosin (FLOMAX) 0.4 MG CAPS capsule   Oral   Take 1 capsule (0.4 mg total) by mouth daily after supper.   30 capsule   0   . zolpidem (AMBIEN) 5 MG tablet   Oral   Take 2.5 mg by mouth as needed. For sleep           Allergies Review of patient's allergies indicates no known allergies.  Family History  Problem Relation Age of Onset  . Cancer Brother   . Heart disease Father     Social History Social History  Substance Use Topics  . Smoking status: Former Research scientist (life sciences)  . Smokeless tobacco: None  . Alcohol Use: No    Review of Systems Constitutional: Negative for fever. Cardiovascular: Negative for chest pain. Respiratory: Negative for shortness of breath. Gastrointestinal: Negative for abdominal pain Musculoskeletal: Right hand pain/skin tear Skin: Skin tear to right hand Neurological: Negative for headache 10-point ROS otherwise negative.  ____________________________________________   PHYSICAL EXAM:  VITAL SIGNS: ED Triage Vitals  Enc Vitals Group     BP --      Pulse Rate 01/11/16 1752 61     Resp 01/11/16 1752 11     Temp 01/11/16 1752 98 F (36.7 C)     Temp Source 01/11/16 1752 Oral     SpO2 01/11/16 1752 99 %     Weight --      Height 01/11/16 1752 5\' 7"  (1.702 m)     Head Cir --  Peak Flow --      Pain Score 01/11/16 1754 4     Pain Loc --      Pain Edu? --      Excl. in [place]? --     Constitutional: Alert. Oriented to situation, person, place, just not to time. Well appearing, no distress. Eyes: Normal exam ENT   Head:  Normocephalic and atraumatic.   Mouth/Throat: Mucous membranes are moist. Cardiovascular: Normal rate, regular rhythm. Respiratory: Normal respiratory effort without tachypnea nor retractions. Breath sounds are clear Gastrointestinal: Soft and nontender. No distention. Musculoskeletal: Moderate skin tear to dorsal aspect of right hand approximately 4 x 5 cm. Good range of motion in all joints including hips. Nontender palpation of C-spine. Neurologic:  Normal speech and language. No gross focal neurologic deficits Skin:  Skin is warm, skin tear as above. Psychiatric: Mood and affect are normal.  ____________________________________________     RADIOLOGY  X-ray of the hand is negative CT head shows no acute abnormality  ____________________________________________   INITIAL IMPRESSION / ASSESSMENT AND PLAN / ED COURSE  Pertinent labs & imaging results that were available during my care of the patient were reviewed by me and considered in my medical decision making (see chart for details).  Patient presents to the emergency department after a fall. Patient has a moderate-sized skin tear to his right hand, otherwise no traumatic findings on exam. Good range of motion in all joints including hips. Patient appears well, no distress. Patient does take Eliquis, we will proceed with a CT scan of the head rule out intracranial abnormality. We'll also x-ray the patient's right hand. Skin tear will be covered with Xeroform and a dressing.  Imaging is negative. Skin tear has been covered with Xeroform and gauze. Patient will follow up with his primary care physician  ____________________________________________   FINAL CLINICAL IMPRESSION(S) / ED DIAGNOSES  Fall Skin tear  Harvest Dark, MD 01/11/16 2007

## 2016-01-27 ENCOUNTER — Institutional Professional Consult (permissible substitution): Payer: Medicare Other | Admitting: Internal Medicine

## 2016-01-31 ENCOUNTER — Telehealth: Payer: Self-pay | Admitting: Cardiovascular Disease

## 2016-01-31 NOTE — Telephone Encounter (Signed)
Patient has a referral for pulmonologist and family wants to make sure this is necessary if not they would rather not do this .  Please call to discuss.

## 2016-01-31 NOTE — Telephone Encounter (Signed)
Spoke w/ pt's son, [name].  He states that he was unaware that pt had appt w/ pulmonology until he got a reminder call. Advised him that at [facility] w/ Christell Faith, PA on 01/05/16, pt and daughter inquired about stopping O2. Advised them at that time pt would need to speak w/ ordering physician, speak w/ PCP, or be referred to pulm. Pt's daughter sched appt w/ pulm, but they cancelled due to transportation issues. [name] does not feel that pt needs this appt, as he has not been wearing O2 for several weeks now w/ no issues. Asked him to call back if we can be of further assistance.

## 2016-02-03 ENCOUNTER — Telehealth: Payer: Self-pay | Admitting: Cardiovascular Disease

## 2016-02-03 NOTE — Telephone Encounter (Signed)
Patients son states that he is no longer at home and is in a facility. His mobility is limited and it is very difficult for him to get around. Discussed with the patients son that it would be a good idea to discuss these concerns with the physician at his appointment so they can determine a plan of care for him and the frequency based on his difficulty to ambulate and get around. He verbalize agreement and understanding and will keep his upcoming appointment.

## 2016-02-03 NOTE — Telephone Encounter (Signed)
Patient son received a 1 month fu reminder and wants to make sure this is needed.  Please call son to discuss if this is needed .

## 2016-02-08 ENCOUNTER — Encounter: Payer: Self-pay | Admitting: Cardiovascular Disease

## 2016-02-08 ENCOUNTER — Ambulatory Visit (INDEPENDENT_AMBULATORY_CARE_PROVIDER_SITE_OTHER): Payer: Medicare Other | Admitting: Cardiovascular Disease

## 2016-02-08 VITALS — BP 99/58 | HR 49 | Ht 67.0 in | Wt 160.0 lb

## 2016-02-08 DIAGNOSIS — I214 Non-ST elevation (NSTEMI) myocardial infarction: Secondary | ICD-10-CM | POA: Diagnosis not present

## 2016-02-08 DIAGNOSIS — I4891 Unspecified atrial fibrillation: Secondary | ICD-10-CM | POA: Diagnosis not present

## 2016-02-08 DIAGNOSIS — Z951 Presence of aortocoronary bypass graft: Secondary | ICD-10-CM

## 2016-02-08 DIAGNOSIS — I480 Paroxysmal atrial fibrillation: Secondary | ICD-10-CM | POA: Diagnosis not present

## 2016-02-08 DIAGNOSIS — F0390 Unspecified dementia without behavioral disturbance: Secondary | ICD-10-CM

## 2016-02-08 DIAGNOSIS — I950 Idiopathic hypotension: Secondary | ICD-10-CM

## 2016-02-08 MED ORDER — AMIODARONE HCL 100 MG PO TABS: 100.0000 mg | ORAL_TABLET | Freq: Every day | ORAL | Status: DC

## 2016-02-08 MED ORDER — APIXABAN 5 MG PO TABS: 5.0000 mg | ORAL_TABLET | Freq: Two times a day (BID) | ORAL | Status: DC

## 2016-02-08 NOTE — Progress Notes (Signed)
Patient ID: Adam Nielsen, male    DOB: [birth year], 82 y.o.   MRN: [id number]  HPI Comments: Adam Nielsen is a 82 y.o. male with history of dementia, CAD s/p 4 or 5 vessel CABG approximately 1997,  chronic respiratory failure with hypoxia on home oxygen via nasal cannula, PAF started on Eliquis 2.5 mg bid  past admission, dementia, metastatic prostate cancer s/p TURP 12/12/2015, BPH,  previous history of hematuria,  diagnosed ICM, recent hospital admission for NSTEMI medically managed with IV heparin, who presents for follow-up of his coronary artery disease, systolic CHF Ejection fraction 35-40% with regions of hypokinesis anterior and anteroseptal wall, basal to mid inferior and posterior wall  In follow-up today, he presents with his son Currently lives in a facility at [place] He does have visiting doctors 2 days per week to help manage his medications Develops a rash in the past several days on face, upper neck. Etiology unclear. Not walking as well as he was at the beginning of January 2017 prior to hospitalization. Son reports he is working with PT. He does like to drink soda. Son reports patient has had no recent falls Recent lab work reviewed shows creatinine with slight increase up to 1.2.  EKG on today's visit shows sinus bradycardia rate 49 bpm, first AV block  Other past medical history PAF of unknown duration and was not previously on full-dose anticoagulation prior to the above admission.  Patient was living on his own up until 09/2015. Marland Kitchen At that time it was discovered he was becoming more forgetful and not taking his medications as directed. At that time he was placed in [facility]. His cognitive function continues to decline.   He recently underwent TURP at [facility] on 3/27 without issues. He was discharged back to [place]. On 3/28 he was standing at his doorway at his room when he suddenly felt a "jolt." he next reported being found down on the  ground by the staff that works there. Upon his arrival at [facility] he was found to have an elevated troponin with a peak of 4.80, CK 408, hgb 11.9, which was stable. He noted significant hematuria in the setting of his recent TURP/Foley. EKG showed lateral st depression. There was discussion regarding cardiac vs no invasive work up. Ultimately, it was decided to manage medically given his dementia and other comorbidities with IV heparin gtt. Echo showed EF 35-40%, HK of the anterior and anteroseptal wall, basal to mid inferior/posterior wall HK. Mild MR, PASP 38 mm Hg. He was started on amiodarone and Eliquis during admission for his Afib.     No Known Allergies  Current Outpatient Prescriptions on File Prior to Visit  Medication Sig Dispense Refill  . albuterol (PROVENTIL HFA;VENTOLIN HFA) 108 (90 Base) MCG/ACT inhaler Inhale 2 puffs into the lungs every 6 (six) hours as needed for wheezing or shortness of breath. 1 Inhaler 0  . benzonatate (TESSALON) 200 MG capsule Take 1 capsule (200 mg total) by mouth 3 (three) times daily. 20 capsule 0  . bicalutamide (CASODEX) 50 MG tablet Take 50 mg by mouth daily. Reported on 12/13/2015    . clobetasol ointment (TEMOVATE) [id number] % Apply 1 application topically 2 (two) times daily.    . divalproex (DEPAKOTE) 125 MG DR tablet Take 125 mg by mouth 2 (two) times daily.     Marland Kitchen donepezil (ARICEPT) 5 MG tablet Take 5 mg by mouth at bedtime.    Marland Kitchen escitalopram (LEXAPRO) 5  MG tablet Take 5 mg by mouth daily.    . finasteride (PROSCAR) 5 MG tablet Take 5 mg by mouth daily.    . furosemide (LASIX) 20 MG tablet Take 1 tablet (20 mg total) by mouth daily. 30 tablet 0  . hydrOXYzine (ATARAX/VISTARIL) 25 MG tablet Take 25 mg by mouth every 6 (six) hours as needed for itching.    Marland Kitchen lisinopril (PRINIVIL,ZESTRIL) 2.5 MG tablet Take 1 tablet (2.5 mg total) by mouth daily. 30 tablet 0  . niacin 500 MG tablet Take 500 mg by mouth daily.     . rosuvastatin (CRESTOR) 5 MG tablet Take 1  tablet (5 mg total) by mouth daily at 6 PM. 30 tablet 0  . tamsulosin (FLOMAX) 0.4 MG CAPS capsule Take 1 capsule (0.4 mg total) by mouth daily after supper. 30 capsule 0  . zolpidem (AMBIEN) 5 MG tablet Take 2.5 mg by mouth as needed. For sleep     No current facility-administered medications on file prior to visit.    Past Medical History  Diagnosis Date  . PAF (paroxysmal atrial fibrillation) (HCC)     a. not on long term full dose anticoagulation  . Hypothyroid   . Prostate CA ([place])   . Dementia   . Acute bronchitis 11/22/2015   . Chronic indwelling Foley catheter   . BPH (benign prostatic hypertrophy)   . Hyperlipidemia   . Urinary tract infection 11/22/2015   . Fall     11/22/2015    . CAD (coronary artery disease)     a. s/p CABG approx 1997; b. patient of Dr. Wynonia Lawman, MD    Past Surgical History  Procedure Laterality Date  . Coronary artery bypass graft    . Transurethral resection of prostate N/A 12/12/2015    Procedure: TRANSURETHRAL RESECTION OF THE PROSTATE ;  Surgeon: Kathie Rhodes, MD;  Location: [facility];  Service: Urology;  Laterality: N/A;    Social History  reports that he has quit smoking. He does not have any smokeless tobacco history on file. He reports that he does not drink alcohol or use illicit drugs.  Family History family history includes Cancer in his brother; Heart disease in his father.   Review of Systems  Constitutional: Negative.   Respiratory: Negative.   Cardiovascular: Negative.   Gastrointestinal: Negative.   Musculoskeletal: Positive for arthralgias and gait problem.  Neurological: Negative.   Hematological: Negative.   Psychiatric/Behavioral: Negative.   All other systems reviewed and are negative. He is a poor historian, most of the details above provided by his son  BP 99/58 mmHg  Pulse 49  Ht 5\' 7"  (1.702 m)  Wt 160 lb (72.576 kg)  BMI 25.05 kg/m2  Physical Exam  Constitutional: He is oriented to person, place, and time. He  appears well-developed and well-nourished.  Presents today in a wheelchair  HENT:  Head: Normocephalic.  Nose: Nose normal.  Mouth/Throat: Oropharynx is clear and moist.  Eyes: Conjunctivae are normal. Pupils are equal, round, and reactive to light.  Neck: Normal range of motion. Neck supple. No JVD present.  Cardiovascular: Normal rate, regular rhythm and intact distal pulses.  Exam reveals no gallop and no friction rub.   Murmur heard.  Systolic murmur is present with a grade of 3/6  Pulmonary/Chest: Effort normal and breath sounds normal. No respiratory distress. He has no wheezes. He has no rales. He exhibits no tenderness.  Abdominal: Soft. Bowel sounds are normal. He exhibits no distension. There is no tenderness.  Musculoskeletal: Normal range of motion. He exhibits no edema or tenderness.  Lymphadenopathy:    He has no cervical adenopathy.  Neurological: He is alert and oriented to person, place, and time. Coordination normal.  Skin: Skin is warm and dry. Rash noted. No erythema.  Psychiatric: He has a normal mood and affect. His behavior is normal. Judgment and thought content normal.

## 2016-02-08 NOTE — Patient Instructions (Addendum)
You are doing well.  Please stop the metoprolol Please decrease amiodarone down to 100 mg once daily  Please have Adam Nielsen wear a hat when outside (may help with rash)  If heart rate & BP continue to run low in June, please call the office  Please increase Eliquis to 5 mg twice daily  Please call us if you have new issues that need to be addressed before your next appt.  Your physician wants you to follow-up in: 6 months.  You will receive a reminder letter in the mail two months in advance. If you don't receive a letter, please call our office to schedule the follow-up appointment.

## 2016-02-08 NOTE — Assessment & Plan Note (Signed)
Currently not on aspirin or Plavix as he is on Eliquis Denies any recent anginal symptoms

## 2016-02-08 NOTE — Assessment & Plan Note (Signed)
Ischemic cardiomyopathy Decision made in the past on prior hospital admission for medical management given dementia

## 2016-02-08 NOTE — Assessment & Plan Note (Addendum)
After long discussion with his son, it would appear he has had rapid decline in the past year Lives at [place]   Total encounter time more than 25 minutes  Greater than 50% was spent in counseling and coordination of care with the patient

## 2016-02-08 NOTE — Assessment & Plan Note (Signed)
Blood pressure continues to run low on today's visit Recommended we hold the metoprolol given bradycardia If blood pressure continues to run low and limits his PT, may need to decrease dose of his Lasix down to every other day, consider holding lisinopril

## 2016-02-08 NOTE — Assessment & Plan Note (Addendum)
Maintaining normal sinus rhythm Given significant bradycardia, we will hold metoprolol, decrease amiodarone down to 100 mg daily Recommended the visiting doctors continue to monitor his heart rate and blood pressure We will increase Eliquis up to 5 mg twice a day as renal function is reasonable, weight is about 70 kg

## 2016-02-15 ENCOUNTER — Encounter: Payer: Self-pay | Admitting: Emergency Medicine

## 2016-02-15 ENCOUNTER — Emergency Department
Admission: EM | Admit: 2016-02-15 | Discharge: 2016-02-15 | Disposition: A | Discharge status: Home / Self Care | Payer: Medicare Other | Attending: Emergency Medicine | Admitting: Emergency Medicine

## 2016-02-15 DIAGNOSIS — Y92129 Unspecified place in nursing home as the place of occurrence of the external cause: Secondary | ICD-10-CM | POA: Diagnosis not present

## 2016-02-15 DIAGNOSIS — Z8546 Personal history of malignant neoplasm of prostate: Secondary | ICD-10-CM | POA: Insufficient documentation

## 2016-02-15 DIAGNOSIS — W19XXXA Unspecified fall, initial encounter: Secondary | ICD-10-CM | POA: Insufficient documentation

## 2016-02-15 DIAGNOSIS — Y999 Unspecified external cause status: Secondary | ICD-10-CM | POA: Insufficient documentation

## 2016-02-15 DIAGNOSIS — Z048 Encounter for examination and observation for other specified reasons: Secondary | ICD-10-CM | POA: Diagnosis not present

## 2016-02-15 DIAGNOSIS — I25810 Atherosclerosis of coronary artery bypass graft(s) without angina pectoris: Secondary | ICD-10-CM | POA: Insufficient documentation

## 2016-02-15 DIAGNOSIS — F0390 Unspecified dementia without behavioral disturbance: Secondary | ICD-10-CM | POA: Insufficient documentation

## 2016-02-15 DIAGNOSIS — E785 Hyperlipidemia, unspecified: Secondary | ICD-10-CM | POA: Insufficient documentation

## 2016-02-15 DIAGNOSIS — I252 Old myocardial infarction: Secondary | ICD-10-CM | POA: Diagnosis not present

## 2016-02-15 DIAGNOSIS — Z79899 Other long term (current) drug therapy: Secondary | ICD-10-CM | POA: Insufficient documentation

## 2016-02-15 DIAGNOSIS — Y939 Activity, unspecified: Secondary | ICD-10-CM | POA: Diagnosis not present

## 2016-02-15 DIAGNOSIS — I480 Paroxysmal atrial fibrillation: Secondary | ICD-10-CM | POA: Insufficient documentation

## 2016-02-15 NOTE — ED Notes (Signed)
AAOx2, to baseline.  Moving all extremities equally and strong.  Denies complaint.  Continue to monitor.

## 2016-02-15 NOTE — ED Notes (Signed)
Arrives from [facility] s/p unwitnessed fall.  Staff report patient found on floor in room.  Patient stated he was trying to go to the bathroom.  Patient c/o low back pain to EMS.

## 2016-02-15 NOTE — Discharge Instructions (Signed)
Fall Prevention in Hospitals, Adult As a hospital patient, your condition and the treatments you receive can increase your risk for falls. Some additional risk factors for falls in a hospital include:  Being in an unfamiliar environment.  Being on bed rest.  Your surgery.  Taking certain medicines.  Your tubing requirements, such as intravenous (IV) therapy or catheters. It is important that you learn how to decrease fall risks while at the hospital. Below are important tips that can help prevent falls. SAFETY TIPS FOR PREVENTING FALLS Talk about your risk of falling.  Ask your health care provider why you are at risk for falling. Is it your medicine, illness, tubing placement, or something else?  Make a plan with your health care provider to keep you safe from falls.  Ask your health care provider or pharmacist about side effects of your medicines. Some medicines can make you dizzy or affect your coordination. Ask for help.  Ask for help before getting out of bed. You may need to press your call button.  Ask for assistance in getting safely to the toilet.  Ask for a walker or cane to be put at your bedside. Ask that most of the side rails on your bed be placed up before your health care provider leaves the room.  Ask family or friends to sit with you.  Ask for things that are out of your reach, such as your glasses, hearing aids, telephone, bedside table, or call button. Follow these tips to avoid falling:  Stay lying or seated, rather than standing, while waiting for help.  Wear rubber-soled slippers or shoes whenever you walk in the hospital.  Avoid quick, sudden movements.  Change positions slowly.  Sit on the side of your bed before standing.  Stand up slowly and wait before you start to walk.  Let your health care provider know if there is a spill on the floor.  Pay careful attention to the medical equipment, electrical cords, and tubes around you.  When you  need help, use your call button by your bed or in the bathroom. Wait for one of your health care providers to help you.  If you feel dizzy or unsure of your footing, return to bed and wait for assistance.  Avoid being distracted by the TV, telephone, or another person in your room.  Do not lean or support yourself on rolling objects, such as IV poles or bedside tables.   This information is not intended to replace advice given to you by your health care provider. Make sure you discuss any questions you have with your health care provider.   Document Released: 08/31/2000 Document Revised: 09/24/2014 Document Reviewed: 05/11/2012 Elsevier Interactive Patient Education [employer].  Patient was seen and evaluated and has no physical complaints and seems to be at baseline. There is no evidence of head injury, abnormal vital signs, etc. No acute injuries could be cited.

## 2016-02-15 NOTE — ED Provider Notes (Signed)
Time Seen: Approximately *1803  I have reviewed the triage notes  Chief Complaint: Fall   History of Present Illness: Adam Nielsen is a 82 y.o. male *who was transported here from [place] for evaluation after an unwitnessed fall. Patient per history has frequent falls and has a history of dementia. Staff at the nursing facility states his dementia may be worse over the last recent period of time but no obvious acute closed head injury. The patient himself is awake and alert and able to answer questions and states he has no significant pain.   Past Medical History  Diagnosis Date  . PAF (paroxysmal atrial fibrillation) (HCC)     a. not on long term full dose anticoagulation  . Hypothyroid   . Prostate CA ([place])   . Dementia   . Acute bronchitis 11/22/2015   . Chronic indwelling Foley catheter   . BPH (benign prostatic hypertrophy)   . Hyperlipidemia   . Urinary tract infection 11/22/2015   . Fall     11/22/2015    . CAD (coronary artery disease)     a. s/p CABG approx 1997; b. patient of Dr. Wynonia Lawman, MD  . Dementia     Patient Active Problem List   Diagnosis Date Noted  . Pressure ulcer 12/18/2015  . Urinary retention   . Paroxysmal atrial fibrillation (HCC)   . Typical atrial flutter ([place])   . NSTEMI (non-ST elevated myocardial infarction) ([place]) 12/14/2015  . Atrial fibrillation with rapid ventricular response ([place])   . S/P TURP   . Hematuria   . S/P CABG (coronary artery bypass graft)   . Hypotension   . BPH (benign prostatic hypertrophy) with urinary retention 12/12/2015  . UTI (urinary tract infection) 11/23/2015  . UTI (lower urinary tract infection) 11/22/2015  . Prostate cancer ([place]) 11/22/2015  . Hypothyroidism 11/22/2015  . Atrial fibrillation ([place]) 11/22/2015  . Dementia 11/22/2015  . Fall 11/22/2015  . Bronchitis 11/22/2015    Past Surgical History  Procedure Laterality Date  . Coronary artery bypass graft    . Transurethral resection of prostate  N/A 12/12/2015    Procedure: TRANSURETHRAL RESECTION OF THE PROSTATE ;  Surgeon: Kathie Rhodes, MD;  Location: [facility];  Service: Urology;  Laterality: N/A;    Past Surgical History  Procedure Laterality Date  . Coronary artery bypass graft    . Transurethral resection of prostate N/A 12/12/2015    Procedure: TRANSURETHRAL RESECTION OF THE PROSTATE ;  Surgeon: Kathie Rhodes, MD;  Location: [facility];  Service: Urology;  Laterality: N/A;    Current Outpatient Rx  Name  Route  Sig  Dispense  Refill  . albuterol (PROVENTIL HFA;VENTOLIN HFA) 108 (90 Base) MCG/ACT inhaler   Inhalation   Inhale 2 puffs into the lungs every 6 (six) hours as needed for wheezing or shortness of breath.   1 Inhaler   0   . Amino Acids-Protein Hydrolys (FEEDING SUPPLEMENT, PRO-STAT SUGAR FREE 64,) LIQD   Oral   Take 30 mLs by mouth 2 (two) times daily.         Marland Kitchen amiodarone (PACERONE) 100 MG tablet   Oral   Take 1 tablet (100 mg total) by mouth daily.   30 tablet   6   . apixaban (ELIQUIS) 5 MG TABS tablet   Oral   Take 1 tablet (5 mg total) by mouth 2 (two) times daily.   60 tablet   6   . benzonatate (TESSALON) 200 MG capsule  Oral   Take 1 capsule (200 mg total) by mouth 3 (three) times daily.   20 capsule   0   . bicalutamide (CASODEX) 50 MG tablet   Oral   Take 50 mg by mouth daily. Reported on 12/13/2015         . clobetasol ointment (TEMOVATE) 0.05 %   Topical   Apply 1 application topically 2 (two) times daily.         . divalproex (DEPAKOTE) 125 MG DR tablet   Oral   Take 125 mg by mouth 2 (two) times daily.          Marland Kitchen donepezil (ARICEPT) 5 MG tablet   Oral   Take 5 mg by mouth at bedtime.         Marland Kitchen escitalopram (LEXAPRO) 5 MG tablet   Oral   Take 5 mg by mouth daily.         . finasteride (PROSCAR) 5 MG tablet   Oral   Take 5 mg by mouth daily.         . furosemide (LASIX) 20 MG tablet   Oral   Take 1 tablet (20 mg total) by mouth daily.   30 tablet   0   .  hydrOXYzine (ATARAX/VISTARIL) 25 MG tablet   Oral   Take 25 mg by mouth every 6 (six) hours as needed for itching.         . levothyroxine (SYNTHROID, LEVOTHROID) 50 MCG tablet   Oral   Take 50 mcg by mouth daily before breakfast.         . lisinopril (PRINIVIL,ZESTRIL) 2.5 MG tablet   Oral   Take 1 tablet (2.5 mg total) by mouth daily.   30 tablet   0   . niacin 500 MG tablet   Oral   Take 500 mg by mouth daily.          . rosuvastatin (CRESTOR) 5 MG tablet   Oral   Take 1 tablet (5 mg total) by mouth daily at 6 PM.   30 tablet   0   . Skin Protectants, Misc. (MINERIN) CREA   Apply externally   Apply topically 2 (two) times daily.         . tamsulosin (FLOMAX) 0.4 MG CAPS capsule   Oral   Take 1 capsule (0.4 mg total) by mouth daily after supper.   30 capsule   0   . triamcinolone cream (KENALOG) 0.5 %   Topical   Apply 1 application topically daily.         Marland Kitchen zolpidem (AMBIEN) 5 MG tablet   Oral   Take 2.5 mg by mouth as needed. For sleep           Allergies:  Review of patient's allergies indicates no known allergies.  Family History: Family History  Problem Relation Age of Onset  . Cancer Brother   . Heart disease Father     Social History: Social History  Substance Use Topics  . Smoking status: Former Research scientist (life sciences)  . Smokeless tobacco: None  . Alcohol Use: No     Review of Systems:   10 point review of systems was performed and was otherwise negative: Review of systems taken through EMS and medical record and some from the patient due to his history of dementia Constitutional: No fever Eyes: No visual disturbances ENT: No sore throat, ear pain Cardiac: No chest pain Respiratory: No shortness of breath, wheezing, or stridor Abdomen: No abdominal  pain, no vomiting, No diarrhea Endocrine: No weight loss, No night sweats Extremities: No peripheral edema, cyanosis Skin: No rashes, easy bruising Neurologic: No focal weakness, trouble  with speech or swollowing Urologic: No dysuria, Hematuria, or urinary frequency   Physical Exam:  ED Triage Vitals  Enc Vitals Group     BP 02/15/16 1757 135/67 mmHg     Pulse Rate 02/15/16 1757 62     Resp 02/15/16 1757 16     Temp 02/15/16 1757 98.3 F (36.8 C)     Temp Source 02/15/16 1757 Oral     SpO2 02/15/16 1757 98 %     Weight 02/15/16 1757 150 lb (68.04 kg)     Height 02/15/16 1757 5\' 7"  (1.702 m)     Head Cir --      Peak Flow --      Pain Score 02/15/16 1759 0     Pain Loc --      Pain Edu? --      Excl. in [place]? --     General: Awake , Alert , and Oriented times 1, Glasgow Coma Scale 15. Head: Normal cephalic , atraumatic Eyes: Pupils equal , round, reactive to light Nose/Throat: No nasal drainage, patent upper airway without erythema or exudate. No facial trauma Neck: Supple, Full range of motion, No anterior adenopathy or palpable thyroid masses Lungs: Clear to ascultation without wheezes , rhonchi, or rales Heart: Regular rate, regular rhythm without murmurs , gallops , or rubs Abdomen: Soft, non tender without rebound, guarding , or rigidity; bowel sounds positive and symmetric in all 4 quadrants. No organomegaly .        Extremities: 2 plus symmetric pulses. No edema, clubbing or cyanosis Neurologic: No motor or focal deficits he is able to grasp and lift both legs up off the stretcher without resistance Skin: Diffuse erythematous rash which is across the face and is supposedly at baseline. Cervical, thoracic, lumbar spine were examined and has no discomfort over these areas with direct palpation and is able to sit up comfortably. Cervical spine he has good flexion extension rotation without any pain or neurapraxia   ED Course:  Patient states he is hungry and be given some food and transported back to [facility]. He did not see a reason to imaging at this time as he has no identifiable focal injuries from this particular fall. He seems to be at baseline as  far as his medical status. There was no syncope associated with his fall.  Assessment:  Fall at nursing home  Final Clinical Impression  Final diagnoses:  Fall at nursing home, initial encounter     Plan:  Transported back to the nursing facility Patient was advised to return immediately if condition worsens. Patient was advised to follow up with their primary care physician or other specialized physicians involved in their outpatient care. The patient and/or family member/power of attorney had laboratory results reviewed at the bedside. All questions and concerns were addressed and appropriate discharge instructions were distributed by the nursing staff.            Daymon Larsen, MD 02/15/16 [phone]

## 2016-03-17 ENCOUNTER — Emergency Department
Admission: EM | Admit: 2016-03-17 | Discharge: 2016-03-17 | Disposition: A | Discharge status: Home / Self Care | Payer: Medicare Other | Source: Home / Self Care | Attending: Emergency Medicine | Admitting: Emergency Medicine

## 2016-03-17 ENCOUNTER — Encounter: Payer: Self-pay | Admitting: Emergency Medicine

## 2016-03-17 DIAGNOSIS — Y999 Unspecified external cause status: Secondary | ICD-10-CM

## 2016-03-17 DIAGNOSIS — I252 Old myocardial infarction: Secondary | ICD-10-CM

## 2016-03-17 DIAGNOSIS — W19XXXA Unspecified fall, initial encounter: Secondary | ICD-10-CM

## 2016-03-17 DIAGNOSIS — I2510 Atherosclerotic heart disease of native coronary artery without angina pectoris: Secondary | ICD-10-CM | POA: Insufficient documentation

## 2016-03-17 DIAGNOSIS — Z79899 Other long term (current) drug therapy: Secondary | ICD-10-CM | POA: Insufficient documentation

## 2016-03-17 DIAGNOSIS — Z048 Encounter for examination and observation for other specified reasons: Secondary | ICD-10-CM | POA: Insufficient documentation

## 2016-03-17 DIAGNOSIS — E039 Hypothyroidism, unspecified: Secondary | ICD-10-CM

## 2016-03-17 DIAGNOSIS — I480 Paroxysmal atrial fibrillation: Secondary | ICD-10-CM | POA: Insufficient documentation

## 2016-03-17 DIAGNOSIS — Z951 Presence of aortocoronary bypass graft: Secondary | ICD-10-CM

## 2016-03-17 DIAGNOSIS — Y929 Unspecified place or not applicable: Secondary | ICD-10-CM | POA: Insufficient documentation

## 2016-03-17 DIAGNOSIS — E785 Hyperlipidemia, unspecified: Secondary | ICD-10-CM

## 2016-03-17 DIAGNOSIS — W050XXA Fall from non-moving wheelchair, initial encounter: Secondary | ICD-10-CM

## 2016-03-17 DIAGNOSIS — Z87891 Personal history of nicotine dependence: Secondary | ICD-10-CM | POA: Insufficient documentation

## 2016-03-17 DIAGNOSIS — Y939 Activity, unspecified: Secondary | ICD-10-CM

## 2016-03-17 DIAGNOSIS — K5791 Diverticulosis of intestine, part unspecified, without perforation or abscess with bleeding: Secondary | ICD-10-CM | POA: Diagnosis not present

## 2016-03-17 DIAGNOSIS — K922 Gastrointestinal hemorrhage, unspecified: Secondary | ICD-10-CM | POA: Diagnosis not present

## 2016-03-17 NOTE — Discharge Instructions (Signed)

## 2016-03-17 NOTE — ED Notes (Signed)
Pt. Transported back to [facility] via EMS.

## 2016-03-17 NOTE — ED Notes (Signed)
Patient arrives to [facility] ED from [facility] via [facility] with complaint of Fall. Patient was transferring from wheelchair to chair

## 2016-03-17 NOTE — ED Provider Notes (Signed)
[facility] Emergency Department Provider Note  ____________________________________________    I have reviewed the triage vital signs and the nursing notes.   HISTORY  Chief Complaint Fall  History limited by patient's dementia  HPI Adam Nielsen is a 82 y.o. male who presents from [facility] via EMS after a fall. I spoke with the nurse caring for the patient at the time and Patient was apparently trying to transfer to his wheelchair and fell in between his bed and wheelchair onto his bottom. He did not hit his head. No noticeable injuries but their policy states that he needs to come to the emergency department. He is not on blood thinners. The patient has no complaints     Past Medical History  Diagnosis Date  . PAF (paroxysmal atrial fibrillation) (HCC)     a. not on long term full dose anticoagulation  . Hypothyroid   . Prostate CA ([place])   . Dementia   . Acute bronchitis 11/22/2015   . Chronic indwelling Foley catheter   . BPH (benign prostatic hypertrophy)   . Hyperlipidemia   . Urinary tract infection 11/22/2015   . Fall     11/22/2015    . CAD (coronary artery disease)     a. s/p CABG approx 1997; b. patient of Dr. Wynonia Lawman, MD  . Dementia     Patient Active Problem List   Diagnosis Date Noted  . Pressure ulcer 12/18/2015  . Urinary retention   . Paroxysmal atrial fibrillation (HCC)   . Typical atrial flutter ([place])   . NSTEMI (non-ST elevated myocardial infarction) ([place]) 12/14/2015  . Atrial fibrillation with rapid ventricular response ([place])   . S/P TURP   . Hematuria   . S/P CABG (coronary artery bypass graft)   . Hypotension   . BPH (benign prostatic hypertrophy) with urinary retention 12/12/2015  . UTI (urinary tract infection) 11/23/2015  . UTI (lower urinary tract infection) 11/22/2015  . Prostate cancer ([place]) 11/22/2015  . Hypothyroidism 11/22/2015  . Atrial fibrillation ([place]) 11/22/2015  . Dementia 11/22/2015  . Fall  11/22/2015  . Bronchitis 11/22/2015    Past Surgical History  Procedure Laterality Date  . Coronary artery bypass graft    . Transurethral resection of prostate N/A 12/12/2015    Procedure: TRANSURETHRAL RESECTION OF THE PROSTATE ;  Surgeon: Kathie Rhodes, MD;  Location: [facility] ORS;  Service: Urology;  Laterality: N/A;    Current Outpatient Rx  Name  Route  Sig  Dispense  Refill  . albuterol (PROVENTIL HFA;VENTOLIN HFA) 108 (90 Base) MCG/ACT inhaler   Inhalation   Inhale 2 puffs into the lungs every 6 (six) hours as needed for wheezing or shortness of breath.   1 Inhaler   0   . Amino Acids-Protein Hydrolys (FEEDING SUPPLEMENT, PRO-STAT SUGAR FREE 64,) LIQD   Oral   Take 30 mLs by mouth 2 (two) times daily.         Marland Kitchen amiodarone (PACERONE) 100 MG tablet   Oral   Take 1 tablet (100 mg total) by mouth daily.   30 tablet   6   . apixaban (ELIQUIS) 5 MG TABS tablet   Oral   Take 1 tablet (5 mg total) by mouth 2 (two) times daily.   60 tablet   6   . benzonatate (TESSALON) 200 MG capsule   Oral   Take 1 capsule (200 mg total) by mouth 3 (three) times daily.   20 capsule   0   .  bicalutamide (CASODEX) 50 MG tablet   Oral   Take 50 mg by mouth daily. Reported on 12/13/2015         . clobetasol ointment (TEMOVATE) 0.05 %   Topical   Apply 1 application topically 2 (two) times daily.         . divalproex (DEPAKOTE) 125 MG DR tablet   Oral   Take 125 mg by mouth 2 (two) times daily.          Marland Kitchen donepezil (ARICEPT) 5 MG tablet   Oral   Take 5 mg by mouth at bedtime.         Marland Kitchen escitalopram (LEXAPRO) 5 MG tablet   Oral   Take 5 mg by mouth daily.         . finasteride (PROSCAR) 5 MG tablet   Oral   Take 5 mg by mouth daily.         . furosemide (LASIX) 20 MG tablet   Oral   Take 1 tablet (20 mg total) by mouth daily.   30 tablet   0   . hydrOXYzine (ATARAX/VISTARIL) 25 MG tablet   Oral   Take 25 mg by mouth every 6 (six) hours as needed for itching.          . levothyroxine (SYNTHROID, LEVOTHROID) 50 MCG tablet   Oral   Take 50 mcg by mouth daily before breakfast.         . lisinopril (PRINIVIL,ZESTRIL) 2.5 MG tablet   Oral   Take 1 tablet (2.5 mg total) by mouth daily.   30 tablet   0   . niacin 500 MG tablet   Oral   Take 500 mg by mouth daily.          . rosuvastatin (CRESTOR) 5 MG tablet   Oral   Take 1 tablet (5 mg total) by mouth daily at 6 PM.   30 tablet   0   . Skin Protectants, Misc. (MINERIN) CREA   Apply externally   Apply topically 2 (two) times daily.         . tamsulosin (FLOMAX) 0.4 MG CAPS capsule   Oral   Take 1 capsule (0.4 mg total) by mouth daily after supper.   30 capsule   0   . triamcinolone cream (KENALOG) 0.5 %   Topical   Apply 1 application topically daily.         Marland Kitchen zolpidem (AMBIEN) 5 MG tablet   Oral   Take 2.5 mg by mouth as needed. For sleep           Allergies Review of patient's allergies indicates no known allergies.  Family History  Problem Relation Age of Onset  . Cancer Brother   . Heart disease Father     Social History Social History  Substance Use Topics  . Smoking status: Former Research scientist (life sciences)  . Smokeless tobacco: None  . Alcohol Use: No    Review of SystemsLimited by dementia  Constitutional: Denies dizziness Eyes: Denies blurred vision ENT: Denies neck pain Cardiovascular: Negative for chest pain Respiratory: Denies shortness of breath Gastrointestinal: Denies abdominal pain  Musculoskeletal: Negative for back pain. No hip pain, no extremity pain Skin: Negative for laceration Neurological: Negative for focal weakness Psychiatric: no anxiety    ____________________________________________   PHYSICAL EXAM:  VITAL SIGNS: ED Triage Vitals  Enc Vitals Group     BP 03/17/16 1401 132/93 mmHg     Pulse Rate 03/17/16 1401 70  Resp 03/17/16 1401 18     Temp 03/17/16 1401 97.8 F (36.6 C)     Temp src --      SpO2 03/17/16 1401 98 %      Weight 03/17/16 1401 160 lb (72.576 kg)     Height 03/17/16 1401 5\' 5"  (1.651 m)     Head Cir --      Peak Flow --      Pain Score --      Pain Loc --      Pain Edu? --      Excl. in [place]? --     Constitutional: Alert and Pleasant.. Well appearing and in no distress.  Eyes: Conjunctivae are normal. No erythema or injection ENT   Head: Normocephalic and atraumatic.   Mouth/Throat: Mucous membranes are moist. Cardiovascular: Normal rate. Normal and symmetric distal pulses are present in the upper extremities. Respiratory: Normal respiratory effort without tachypnea nor retractions. Breath sounds are clear and equal bilaterally.  Gastrointestinal: Soft and non-tender in all quadrants. No distention. There is no CVA tenderness. Genitourinary: deferred Musculoskeletal: Nontender with normal range of motion in all extremities. No lower extremity tenderness nor edema. No tenderness to palpation of bilateral hips, no pain with axial load on both hips, full range of motion without any difficulty at all. Neurologic:  Normal speech and language. No gross focal neurologic deficits are appreciated. Skin:  Skin is warm, dry and intact. No rash noted. Psychiatric: Mood and affect are normal.   ____________________________________________    LABS (pertinent positives/negatives)  Labs Reviewed - No data to display  ____________________________________________   EKG  None  ____________________________________________    RADIOLOGY  None  ____________________________________________   PROCEDURES  Procedure(s) performed: none  Critical Care performed: none  ____________________________________________   INITIAL IMPRESSION / ASSESSMENT AND PLAN / ED COURSE  Pertinent labs & imaging results that were available during my care of the patient were reviewed by me and considered in my medical decision making (see chart for details).  Patient well-appearing and in no distress.  Extensive exam performed which was benign. He apparently did not suffer any injury from his fall. Feel he is a break for discharge at this time. No imaging or blood work necessary  ____________________________________________   FINAL CLINICAL IMPRESSION(S) / ED DIAGNOSES  Final diagnoses:  Accident due to mechanical fall without injury          Lavonia Drafts, MD 03/17/16 [phone]

## 2016-03-19 ENCOUNTER — Inpatient Hospital Stay
Admission: EM | Admit: 2016-03-19 | Discharge: 2016-03-22 | DRG: 379 | Disposition: A | Discharge status: Home / Self Care | Payer: Medicare Other | Attending: Internal Medicine | Admitting: Internal Medicine

## 2016-03-19 DIAGNOSIS — Z7901 Long term (current) use of anticoagulants: Secondary | ICD-10-CM | POA: Diagnosis not present

## 2016-03-19 DIAGNOSIS — E039 Hypothyroidism, unspecified: Secondary | ICD-10-CM | POA: Diagnosis present

## 2016-03-19 DIAGNOSIS — Z9079 Acquired absence of other genital organ(s): Secondary | ICD-10-CM | POA: Diagnosis not present

## 2016-03-19 DIAGNOSIS — I2510 Atherosclerotic heart disease of native coronary artery without angina pectoris: Secondary | ICD-10-CM | POA: Diagnosis present

## 2016-03-19 DIAGNOSIS — Z66 Do not resuscitate: Secondary | ICD-10-CM | POA: Diagnosis present

## 2016-03-19 DIAGNOSIS — Z8249 Family history of ischemic heart disease and other diseases of the circulatory system: Secondary | ICD-10-CM

## 2016-03-19 DIAGNOSIS — Z87891 Personal history of nicotine dependence: Secondary | ICD-10-CM | POA: Diagnosis not present

## 2016-03-19 DIAGNOSIS — Z8546 Personal history of malignant neoplasm of prostate: Secondary | ICD-10-CM

## 2016-03-19 DIAGNOSIS — E785 Hyperlipidemia, unspecified: Secondary | ICD-10-CM | POA: Diagnosis present

## 2016-03-19 DIAGNOSIS — K5791 Diverticulosis of intestine, part unspecified, without perforation or abscess with bleeding: Secondary | ICD-10-CM | POA: Diagnosis present

## 2016-03-19 DIAGNOSIS — D649 Anemia, unspecified: Secondary | ICD-10-CM | POA: Diagnosis present

## 2016-03-19 DIAGNOSIS — K922 Gastrointestinal hemorrhage, unspecified: Secondary | ICD-10-CM | POA: Diagnosis present

## 2016-03-19 DIAGNOSIS — Z809 Family history of malignant neoplasm, unspecified: Secondary | ICD-10-CM

## 2016-03-19 DIAGNOSIS — I480 Paroxysmal atrial fibrillation: Secondary | ICD-10-CM | POA: Diagnosis present

## 2016-03-19 DIAGNOSIS — N401 Enlarged prostate with lower urinary tract symptoms: Secondary | ICD-10-CM | POA: Diagnosis present

## 2016-03-19 DIAGNOSIS — Z951 Presence of aortocoronary bypass graft: Secondary | ICD-10-CM | POA: Diagnosis not present

## 2016-03-19 DIAGNOSIS — Z79899 Other long term (current) drug therapy: Secondary | ICD-10-CM

## 2016-03-19 DIAGNOSIS — F0390 Unspecified dementia without behavioral disturbance: Secondary | ICD-10-CM | POA: Diagnosis present

## 2016-03-19 DIAGNOSIS — R338 Other retention of urine: Secondary | ICD-10-CM | POA: Diagnosis present

## 2016-03-19 LAB — COMPREHENSIVE METABOLIC PANEL
ALBUMIN: 3.6 g/dL (ref 3.5–5.0)
ALT: 19 U/L (ref 17–63)
AST: 27 U/L (ref 15–41)
Alkaline Phosphatase: 53 U/L (ref 38–126)
Anion gap: 10 (ref 5–15)
BILIRUBIN TOTAL: 0.4 mg/dL (ref 0.3–1.2)
BUN: 21 mg/dL — AB (ref 6–20)
CHLORIDE: 106 mmol/L (ref 101–111)
CO2: 21 mmol/L — ABNORMAL LOW (ref 22–32)
CREATININE: 1.49 mg/dL — AB (ref 0.61–1.24)
Calcium: 8.9 mg/dL (ref 8.9–10.3)
GFR calc Af Amer: 46 mL/min — ABNORMAL LOW (ref >60)
GFR calc non Af Amer: 40 mL/min — ABNORMAL LOW (ref >60)
GLUCOSE: 147 mg/dL — AB (ref 65–99)
POTASSIUM: 3.4 mmol/L — AB (ref 3.5–5.1)
Sodium: 137 mmol/L (ref 135–145)
Total Protein: 6.8 g/dL (ref 6.5–8.1)

## 2016-03-19 LAB — PROTIME-INR
INR: 1.22
Prothrombin Time: 15.6 seconds — ABNORMAL HIGH (ref 11.4–15.0)

## 2016-03-19 LAB — CBC WITH DIFFERENTIAL/PLATELET
Basophils Absolute: 0 10*3/uL (ref 0–0.1)
Basophils Relative: 1 %
EOS PCT: 0 %
Eosinophils Absolute: 0 10*3/uL (ref 0–0.7)
HCT: 23.1 % — ABNORMAL LOW (ref 40.0–52.0)
Hemoglobin: 7.8 g/dL — ABNORMAL LOW (ref 13.0–18.0)
LYMPHS ABS: 0.5 10*3/uL — AB (ref 1.0–3.6)
LYMPHS PCT: 8 %
MCH: 32.4 pg (ref 26.0–34.0)
MCHC: 33.9 g/dL (ref 32.0–36.0)
MCV: 95.5 fL (ref 80.0–100.0)
MONO ABS: 0.5 10*3/uL (ref 0.2–1.0)
Monocytes Relative: 8 %
Neutro Abs: 5.3 10*3/uL (ref 1.4–6.5)
Neutrophils Relative %: 83 %
PLATELETS: 248 10*3/uL (ref 150–440)
RBC: 2.42 MIL/uL — ABNORMAL LOW (ref 4.40–5.90)
RDW: 15.4 % — AB (ref 11.5–14.5)
WBC: 6.4 10*3/uL (ref 3.8–10.6)

## 2016-03-19 LAB — ABO/RH: ABO/RH(D): O POS

## 2016-03-19 LAB — PREPARE RBC (CROSSMATCH)

## 2016-03-19 LAB — LIPASE, BLOOD: Lipase: 17 U/L (ref 11–51)

## 2016-03-19 MED ORDER — SODIUM CHLORIDE 0.9 % IV BOLUS (SEPSIS)
1000.0000 mL | Freq: Once | INTRAVENOUS | Status: AC
  Administered 2016-03-19: 1000 mL via INTRAVENOUS

## 2016-03-19 MED ORDER — SODIUM CHLORIDE 0.9 % IV SOLN
80.0000 mg | Freq: Once | INTRAVENOUS | Status: AC
  Administered 2016-03-20: 02:00:00 80 mg via INTRAVENOUS
  Filled 2016-03-19: qty 80

## 2016-03-19 MED ORDER — SODIUM CHLORIDE 0.9 % IV SOLN
10.0000 mL/h | Freq: Once | INTRAVENOUS | Status: AC
  Administered 2016-03-20: 02:00:00 10 mL/h via INTRAVENOUS

## 2016-03-19 MED ORDER — PANTOPRAZOLE SODIUM 40 MG IV SOLR: 40.0000 mg | Freq: Two times a day (BID) | INTRAVENOUS | Status: DC

## 2016-03-19 MED ORDER — SODIUM CHLORIDE 0.9 % IV SOLN
8.0000 mg/h | INTRAVENOUS | Status: DC
  Administered 2016-03-20 – 2016-03-21 (×3): 8 mg/h via INTRAVENOUS
  Filled 2016-03-19 (×3): qty 80

## 2016-03-19 NOTE — ED Notes (Signed)
Pt resting comfortably at this time.  Educated pt and son at bedside to call when pt need to urinate.  Verbalized understanding.  No questions or concerns at this time.

## 2016-03-19 NOTE — ED Notes (Signed)
EDP notified of pt's hemoglobin.

## 2016-03-19 NOTE — H&P (Signed)
[place] at [place] NAME: Adam Nielsen    MR#:  [id number]  DATE OF BIRTH:  1925/06/22  DATE OF ADMISSION:  03/19/2016  PRIMARY CARE PHYSICIAN: Pcp Not In System   REQUESTING/REFERRING PHYSICIAN: Burlene Arnt, MD  CHIEF COMPLAINT:   Chief Complaint  Patient presents with  . Emesis  . Diarrhea    HISTORY OF PRESENT ILLNESS:  Adam Nielsen  is a 82 y.o. male who presents with An episode of significant diarrhea. He was brought to the ED for evaluation from his nursing facility for the same. Here he was found to have a hemoglobin of 7.8 which is down from his baseline of 11-12. He is on anticoagulation. He was ordered for transfusion of packed red blood cells in the ED, and hospitalists were called for admission. We do not currently have GI support in this hospital over the next day or so, though the family opted not to have any invasive intervention at this time either way.  PAST MEDICAL HISTORY:   Past Medical History  Diagnosis Date  . PAF (paroxysmal atrial fibrillation) (HCC)     a. not on long term full dose anticoagulation  . Hypothyroid   . Prostate CA ([place])   . Dementia   . Acute bronchitis 11/22/2015   . Chronic indwelling Foley catheter   . BPH (benign prostatic hypertrophy)   . Hyperlipidemia   . Urinary tract infection 11/22/2015   . Fall     11/22/2015    . CAD (coronary artery disease)     a. s/p CABG approx 1997; b. patient of Dr. Wynonia Lawman, MD  . Dementia     PAST SURGICAL HISTORY:   Past Surgical History  Procedure Laterality Date  . Coronary artery bypass graft    . Transurethral resection of prostate N/A 12/12/2015    Procedure: TRANSURETHRAL RESECTION OF THE PROSTATE ;  Surgeon: Kathie Rhodes, MD;  Location: [facility];  Service: Urology;  Laterality: N/A;    SOCIAL HISTORY:   Social History  Substance Use Topics  . Smoking status: Former Research scientist (life sciences)  . Smokeless tobacco: Not on file  . Alcohol Use: No    FAMILY  HISTORY:   Family History  Problem Relation Age of Onset  . Cancer Brother   . Heart disease Father     DRUG ALLERGIES:  No Known Allergies  MEDICATIONS AT HOME:   Prior to Admission medications   Medication Sig Start Date End Date Taking? Authorizing Provider  acetaminophen (TYLENOL) 500 MG tablet Take 500 mg by mouth every 4 (four) hours as needed.   Yes Historical Provider, MD  albuterol (PROVENTIL HFA;VENTOLIN HFA) 108 (90 Base) MCG/ACT inhaler Inhale 2 puffs into the lungs every 6 (six) hours as needed for wheezing or shortness of breath. 11/24/15  Yes Loletha Grayer, MD  alum & mag hydroxide-simeth (MINTOX) 200-200-20 MG/5ML suspension Take 30 mLs by mouth as needed for indigestion or heartburn.   Yes Historical Provider, MD  Amino Acids-Protein Hydrolys (FEEDING SUPPLEMENT, PRO-STAT SUGAR FREE 64,) LIQD Take 30 mLs by mouth 2 (two) times daily.   Yes Historical Provider, MD  amiodarone (PACERONE) 100 MG tablet Take 1 tablet (100 mg total) by mouth daily. 02/08/16  Yes Minna Merritts, MD  apixaban (ELIQUIS) 5 MG TABS tablet Take 1 tablet (5 mg total) by mouth 2 (two) times daily. 02/08/16  Yes Minna Merritts, MD  bicalutamide (CASODEX) 50 MG tablet Take 50 mg by mouth daily. Reported  on 12/13/2015   Yes Historical Provider, MD  divalproex (DEPAKOTE SPRINKLE) 125 MG capsule Take 250 mg by mouth 2 (two) times daily.   Yes Historical Provider, MD  donepezil (ARICEPT) 5 MG tablet Take 5 mg by mouth at bedtime.   Yes Historical Provider, MD  escitalopram (LEXAPRO) 5 MG tablet Take 5 mg by mouth daily.   Yes Historical Provider, MD  fexofenadine (ALLEGRA) 180 MG tablet Take 180 mg by mouth daily.   Yes Historical Provider, MD  finasteride (PROSCAR) 5 MG tablet Take 5 mg by mouth daily.   Yes Historical Provider, MD  furosemide (LASIX) 20 MG tablet Take 1 tablet (20 mg total) by mouth daily. 12/22/15  Yes Vaughan Basta, MD  guaifenesin (ROBITUSSIN) 100 MG/5ML syrup Take 200 mg by  mouth every 6 (six) hours as needed for cough.   Yes Historical Provider, MD  hydrocortisone 2.5 % cream Apply topically 2 (two) times daily as needed.   Yes Historical Provider, MD  levothyroxine (SYNTHROID, LEVOTHROID) 50 MCG tablet Take 50 mcg by mouth daily before breakfast.   Yes Historical Provider, MD  lisinopril (PRINIVIL,ZESTRIL) 2.5 MG tablet Take 1 tablet (2.5 mg total) by mouth daily. 12/21/15  Yes Vaughan Basta, MD  loperamide (IMODIUM) 2 MG capsule Take 2 mg by mouth as needed for diarrhea or loose stools.   Yes Historical Provider, MD  LORazepam (ATIVAN) 0.5 MG tablet Take 0.25 mg by mouth 2 (two) times daily.   Yes Historical Provider, MD  magnesium hydroxide (MILK OF MAGNESIA) 400 MG/5ML suspension Take 30 mLs by mouth at bedtime as needed for mild constipation.   Yes Historical Provider, MD  Melatonin 3 MG TABS Take 3 mg by mouth at bedtime.   Yes Historical Provider, MD  miconazole (MICOTIN) 2 % cream Apply 1 application topically 3 (three) times daily as needed.   Yes Historical Provider, MD  Multiple Vitamin (MULTIVITAMIN WITH MINERALS) TABS tablet Take 1 tablet by mouth daily.   Yes Historical Provider, MD  neomycin-bacitracin-polymyxin (NEOSPORIN) ointment Apply 1 application topically as needed for wound care. apply to eye   Yes Historical Provider, MD  rosuvastatin (CRESTOR) 5 MG tablet Take 1 tablet (5 mg total) by mouth daily at 6 PM. Patient taking differently: Take 5 mg by mouth daily.  12/21/15  Yes Vaughan Basta, MD  Skin Protectants, Misc. (MINERIN) CREA Apply topically 2 (two) times daily.   Yes Historical Provider, MD  tamsulosin (FLOMAX) 0.4 MG CAPS capsule Take 1 capsule (0.4 mg total) by mouth daily after supper. 12/21/15  Yes Vaughan Basta, MD  triamcinolone cream (KENALOG) 0.5 % Apply 1 application topically daily.   Yes Historical Provider, MD    REVIEW OF SYSTEMS:  Review of Systems  Unable to perform ROS: dementia     VITAL SIGNS:    Filed Vitals:   03/19/16 2130 03/19/16 2200 03/19/16 2230 03/19/16 2300  BP: 94/56 121/66 123/58 118/68  Pulse: 84 72 76 76  Temp:      TempSrc:      Resp: 26 18 15 16   SpO2: 100% 100% 100% 100%   Wt Readings from Last 3 Encounters:  03/17/16 72.576 kg (160 lb)  02/15/16 68.04 kg (150 lb)  02/08/16 72.576 kg (160 lb)    PHYSICAL EXAMINATION:  Physical Exam  Vitals reviewed. Constitutional: He appears well-developed and well-nourished. No distress.  HENT:  Head: Normocephalic and atraumatic.  Mouth/Throat: Oropharynx is clear and moist.  Eyes: Conjunctivae and EOM are normal. Pupils are equal,  round, and reactive to light. No scleral icterus.  Neck: Normal range of motion. Neck supple. No JVD present. No thyromegaly present.  Cardiovascular: Normal rate, regular rhythm and intact distal pulses.  Exam reveals no gallop and no friction rub.   Murmur heard. Respiratory: Effort normal and breath sounds normal. No respiratory distress. He has no wheezes. He has no rales.  GI: Soft. Bowel sounds are normal. He exhibits no distension. There is no tenderness.  Musculoskeletal: Normal range of motion. He exhibits no edema.  No arthritis, no gout  Lymphadenopathy:    He has no cervical adenopathy.  Neurological: He is alert. No cranial nerve deficit.  Could not fully assess due to the patient's condition  Skin: Skin is warm and dry. No rash noted. No erythema.  Psychiatric:  Unable to assess due to patient's dementia    LABORATORY PANEL:   CBC  Recent Labs Lab 03/19/16 2014  WBC 6.4  HGB 7.8*  HCT 23.1*  PLT 248   ------------------------------------------------------------------------------------------------------------------  Chemistries   Recent Labs Lab 03/19/16 2014  NA 137  K 3.4*  CL 106  CO2 21*  GLUCOSE 147*  BUN 21*  CREATININE 1.49*  CALCIUM 8.9  AST 27  ALT 19  ALKPHOS 53  BILITOT 0.4    ------------------------------------------------------------------------------------------------------------------  Cardiac Enzymes No results for input(s): TROPONINI in the last 168 hours. ------------------------------------------------------------------------------------------------------------------  RADIOLOGY:  No results found.  EKG:   Orders placed or performed in visit on 02/08/16  . EKG 12-Lead    IMPRESSION AND PLAN:  Principal Problem:   GI bleed - no history of overt melena or bright red blood per rectum. This is likely a slower bleed. We will transfuse the patient with a unit of packed red blood cells. We'll monitor his hemoglobin closely. We'll have him on a Protonix drip for now. Family states that if his hemoglobin were to continue to fall, or if he were to have any significant bleeding events they might reconsider the need for GI and intervention. Active Problems:   Paroxysmal atrial fibrillation (HCC) - continue home meds except for his anticoagulation which we will hold for now due to the above listed problems.   CAD (coronary artery disease) - continue home meds   Hypothyroidism - home dose thyroid replacement   Dementia - continue home meds for this   BPH (benign prostatic hypertrophy) with urinary retention - continue home meds  All the records are reviewed and case discussed with ED provider. Management plans discussed with the patient and/or family.  DVT PROPHYLAXIS: Mechanical only  GI PROPHYLAXIS: PPI  ADMISSION STATUS: Inpatient  CODE STATUS: DNR Code Status History    Date Active Date Inactive Code Status Order ID Comments User Context   12/14/2015  1:06 PM 12/21/2015  7:37 PM DNR [id number]  Gladstone Lighter, MD ED   12/14/2015  4:26 AM 12/14/2015  1:06 PM Full Code [id number]  Harrie Foreman, MD ED   12/12/2015 12:00 PM 12/13/2015  4:35 PM Full Code [id number]  Kathie Rhodes, MD Inpatient   11/22/2015 11:40 PM 11/25/2015  2:43 PM Full Code [id number]   Lance Coon, MD Inpatient    Questions for Most Recent Historical Code Status (Order [id number])    Question Answer Comment   In the event of cardiac or respiratory ARREST Do not call a "code blue"    In the event of cardiac or respiratory ARREST Do not perform Intubation, CPR, defibrillation or ACLS    In the event of cardiac  or respiratory ARREST Use medication by any route, position, wound care, and other measures to relive pain and suffering. May use oxygen, suction and manual treatment of airway obstruction as needed for comfort.       TOTAL TIME TAKING CARE OF THIS PATIENT: 45 minutes.    Jaylinn Hellenbrand [place] 03/19/2016, 11:29 PM  [facility]  Office  [phone]  CC: Primary care physician; Pcp Not In System

## 2016-03-19 NOTE — ED Notes (Signed)
Pt signed consent for blood.  Son at bedside.

## 2016-03-19 NOTE — ED Notes (Signed)
Per pt's son pt here from  house with vomiting and diarrhea. Son unsure of when episode started, pt not able to communicate when episode started. Pt pale, tenting.

## 2016-03-19 NOTE — ED Provider Notes (Addendum)
[facility] Emergency Department Provider Note  ____________________________________________   I have reviewed the triage vital signs and the nursing notes.   HISTORY  Chief Complaint Emesis and Diarrhea    HPI Adam Nielsen is a 82 y.o. male who presents today complaining of nausea vomiting diarrhea. Son states that it began sometime today. Patient has at his baseline. No information about whether was black or bloody. Patient has not had any abdominal pain and is not complaining of anything.Level 5 chart caveat; no further history available due to patient status.   Past Medical History  Diagnosis Date  . PAF (paroxysmal atrial fibrillation) (HCC)     a. not on long term full dose anticoagulation  . Hypothyroid   . Prostate CA ([place])   . Dementia   . Acute bronchitis 11/22/2015   . Chronic indwelling Foley catheter   . BPH (benign prostatic hypertrophy)   . Hyperlipidemia   . Urinary tract infection 11/22/2015   . Fall     11/22/2015    . CAD (coronary artery disease)     a. s/p CABG approx 1997; b. patient of Dr. Wynonia Lawman, MD  . Dementia     Patient Active Problem List   Diagnosis Date Noted  . Pressure ulcer 12/18/2015  . Urinary retention   . Paroxysmal atrial fibrillation (HCC)   . Typical atrial flutter ([place])   . NSTEMI (non-ST elevated myocardial infarction) ([place]) 12/14/2015  . Atrial fibrillation with rapid ventricular response ([place])   . S/P TURP   . Hematuria   . S/P CABG (coronary artery bypass graft)   . Hypotension   . BPH (benign prostatic hypertrophy) with urinary retention 12/12/2015  . UTI (urinary tract infection) 11/23/2015  . UTI (lower urinary tract infection) 11/22/2015  . Prostate cancer ([place]) 11/22/2015  . Hypothyroidism 11/22/2015  . Atrial fibrillation ([place]) 11/22/2015  . Dementia 11/22/2015  . Fall 11/22/2015  . Bronchitis 11/22/2015    Past Surgical History  Procedure Laterality Date  . Coronary artery bypass  graft    . Transurethral resection of prostate N/A 12/12/2015    Procedure: TRANSURETHRAL RESECTION OF THE PROSTATE ;  Surgeon: Kathie Rhodes, MD;  Location: [facility] ORS;  Service: Urology;  Laterality: N/A;    Current Outpatient Rx  Name  Route  Sig  Dispense  Refill  . acetaminophen (TYLENOL) 500 MG tablet   Oral   Take 500 mg by mouth every 4 (four) hours as needed.         Marland Kitchen albuterol (PROVENTIL HFA;VENTOLIN HFA) 108 (90 Base) MCG/ACT inhaler   Inhalation   Inhale 2 puffs into the lungs every 6 (six) hours as needed for wheezing or shortness of breath.   1 Inhaler   0   . alum & mag hydroxide-simeth (MINTOX) [id number] MG/5ML suspension   Oral   Take 30 mLs by mouth as needed for indigestion or heartburn.         . Amino Acids-Protein Hydrolys (FEEDING SUPPLEMENT, PRO-STAT SUGAR FREE 64,) LIQD   Oral   Take 30 mLs by mouth 2 (two) times daily.         Marland Kitchen amiodarone (PACERONE) 100 MG tablet   Oral   Take 1 tablet (100 mg total) by mouth daily.   30 tablet   6   . apixaban (ELIQUIS) 5 MG TABS tablet   Oral   Take 1 tablet (5 mg total) by mouth 2 (two) times daily.   60 tablet   6   .  bicalutamide (CASODEX) 50 MG tablet   Oral   Take 50 mg by mouth daily. Reported on 12/13/2015         . divalproex (DEPAKOTE SPRINKLE) 125 MG capsule   Oral   Take 250 mg by mouth 2 (two) times daily.         Marland Kitchen donepezil (ARICEPT) 5 MG tablet   Oral   Take 5 mg by mouth at bedtime.         Marland Kitchen escitalopram (LEXAPRO) 5 MG tablet   Oral   Take 5 mg by mouth daily.         . fexofenadine (ALLEGRA) 180 MG tablet   Oral   Take 180 mg by mouth daily.         . finasteride (PROSCAR) 5 MG tablet   Oral   Take 5 mg by mouth daily.         . furosemide (LASIX) 20 MG tablet   Oral   Take 1 tablet (20 mg total) by mouth daily.   30 tablet   0   . guaifenesin (ROBITUSSIN) 100 MG/5ML syrup   Oral   Take 200 mg by mouth every 6 (six) hours as needed for cough.         .  hydrocortisone 2.5 % cream   Topical   Apply topically 2 (two) times daily as needed.         Marland Kitchen levothyroxine (SYNTHROID, LEVOTHROID) 50 MCG tablet   Oral   Take 50 mcg by mouth daily before breakfast.         . lisinopril (PRINIVIL,ZESTRIL) 2.5 MG tablet   Oral   Take 1 tablet (2.5 mg total) by mouth daily.   30 tablet   0   . loperamide (IMODIUM) 2 MG capsule   Oral   Take 2 mg by mouth as needed for diarrhea or loose stools.         Marland Kitchen LORazepam (ATIVAN) 0.5 MG tablet   Oral   Take 0.25 mg by mouth 2 (two) times daily.         . magnesium hydroxide (MILK OF MAGNESIA) 400 MG/5ML suspension   Oral   Take 30 mLs by mouth at bedtime as needed for mild constipation.         . Melatonin 3 MG TABS   Oral   Take 3 mg by mouth at bedtime.         . miconazole (MICOTIN) 2 % cream   Topical   Apply 1 application topically 3 (three) times daily as needed.         . Multiple Vitamin (MULTIVITAMIN WITH MINERALS) TABS tablet   Oral   Take 1 tablet by mouth daily.         Marland Kitchen neomycin-bacitracin-polymyxin (NEOSPORIN) ointment   Topical   Apply 1 application topically as needed for wound care. apply to eye         . rosuvastatin (CRESTOR) 5 MG tablet   Oral   Take 1 tablet (5 mg total) by mouth daily at 6 PM. Patient taking differently: Take 5 mg by mouth daily.    30 tablet   0   . Skin Protectants, Misc. (MINERIN) CREA   Apply externally   Apply topically 2 (two) times daily.         . tamsulosin (FLOMAX) 0.4 MG CAPS capsule   Oral   Take 1 capsule (0.4 mg total) by mouth daily after supper.   30 capsule  0   . triamcinolone cream (KENALOG) 0.5 %   Topical   Apply 1 application topically daily.           Allergies Review of patient's allergies indicates no known allergies.  Family History  Problem Relation Age of Onset  . Cancer Brother   . Heart disease Father     Social History Social History  Substance Use Topics  . Smoking  status: Former Research scientist (life sciences)  . Smokeless tobacco: Not on file  . Alcohol Use: No    Review of Systems Level 5 chart caveat; no further history available due to patient status.  ____________________________________________   PHYSICAL EXAM:  VITAL SIGNS: ED Triage Vitals  Enc Vitals Group     BP 03/19/16 2002 78/49 mmHg     Pulse Rate 03/19/16 2002 108     Resp 03/19/16 2012 18     Temp 03/19/16 2021 98.9 F (37.2 C)     Temp Source 03/19/16 2021 Oral     SpO2 03/19/16 2012 99 %     Weight --      Height --      Head Cir --      Peak Flow --      Pain Score --      Pain Loc --      Pain Edu? --      Excl. in [place]? --     Constitutional: Alert and Pleasantly demented pale appearing Eyes: Conjunctivae are normal. PERRL. EOMI. Head: Atraumatic. Nose: No congestion/rhinnorhea. Mouth/Throat: Mucous membranes are moist.  Oropharynx non-erythematous. Neck: No stridor.   Nontender with no meningismus Cardiovascular: Normal rate, regular rhythm. Grossly normal heart sounds.  Good peripheral circulation. Respiratory: Normal respiratory effort.  No retractions. Lungs CTAB. Abdominal: Soft and nontender. No distention. No guarding no rebound Back:  There is no focal tenderness or step off there is no midline tenderness there are no lesions noted. there is no CVA tenderness Rectal exam: Guaiac positive brown school Musculoskeletal: No lower extremity tenderness. No joint effusions, no DVT signs strong distal pulses no edema Neurologic:  Normal speech and language. No gross focal neurologic deficits are appreciated.  Skin:  Skin is warm, dry and intact. No rash noted. Psychiatric: Mood and affect are Pleasantly demented. Speech and behavior are normal.  ____________________________________________   LABS (all labs ordered are listed, but only abnormal results are displayed)  Labs Reviewed  COMPREHENSIVE METABOLIC PANEL - Abnormal; Notable for the following:    Potassium 3.4 (*)     CO2 21 (*)    Glucose, Bld 147 (*)    BUN 21 (*)    Creatinine, Ser 1.49 (*)    GFR calc non Af Amer 40 (*)    GFR calc Af Amer 46 (*)    All other components within normal limits  CBC WITH DIFFERENTIAL/PLATELET - Abnormal; Notable for the following:    RBC 2.42 (*)    Hemoglobin 7.8 (*)    HCT 23.1 (*)    RDW 15.4 (*)    Lymphs Abs 0.5 (*)    All other components within normal limits  LIPASE, BLOOD  URINALYSIS COMPLETEWITH MICROSCOPIC ([facility] ONLY)  PROTIME-INR  TYPE AND SCREEN  PREPARE RBC (CROSSMATCH)   ____________________________________________  EKG  I personally interpreted any EKGs ordered by me or triage Poorly determine rhythm, likely atrial fibrillation, no acute ST elevation or depression nonspecific ST changes normal axis  RADIOLOGY  I reviewed any imaging ordered by me or triage that were  performed during my shift and, if possible, patient and/or family made aware of any abnormal findings. ____________________________________________   PROCEDURES  Procedure(s) performed: None  Critical Care performed: CRITICAL CARE Performed by: Schuyler Amor   Total critical care time: 35 minutes  Critical care time was exclusive of separately billable procedures and treating other patients.  Critical care was necessary to treat or prevent imminent or life-threatening deterioration.  Critical care was time spent personally by me on the following activities: development of treatment plan with patient and/or surrogate as well as nursing, discussions with consultants, evaluation of patient's response to treatment, examination of patient, obtaining history from patient or surrogate, ordering and performing treatments and interventions, ordering and review of laboratory studies, ordering and review of radiographic studies, pulse oximetry and re-evaluation of patient's condition.   ____________________________________________   INITIAL IMPRESSION / ASSESSMENT AND PLAN /  ED COURSE  Pertinent labs & imaging results that were available during my care of the patient were reviewed by me and considered in my medical decision making (see chart for details).  Patient with nausea vomiting and diarrhea, low hemoglobin noted. He is guaiac positive. He'll need a transfusion. He is no acute distress. Blood pressure this time is 120. Likely acute on chronic this is certainly different from his baseline however. Patient is DO NOT RESUSCITATE.  Patient is on eliquis according to notes.   ----------------------------------------- 10:36 PM on 03/19/2016 -----------------------------------------  There is no GI coverage at this hospital tomorrow. For this reason, I did offer the family transport to [facility], the son however would prefer not to transfer the patient and he understands that we do not have GI coverage. He states he would not feel that the patient would likely survive a colonoscopy or endoscopy even if he needed it, and they would prefer not to put him through the rigors of transport for procedures they don't think that they would consent to. They understand the risks benefits alternatives of transfer to nonstress poor. We'll discuss with the hospitalist the family's preference. ____________________________________________   FINAL CLINICAL IMPRESSION(S) / ED DIAGNOSES  Final diagnoses:  None      This chart was dictated using voice recognition software.  Despite best efforts to proofread,  errors can occur which can change meaning.    Schuyler Amor, MD 03/19/16 2213  Schuyler Amor, MD 03/19/16 [id number]  Schuyler Amor, MD 03/19/16 [id number]  Schuyler Amor, MD 03/19/16 [id number]  Schuyler Amor, MD 03/19/16 2312

## 2016-03-20 LAB — BASIC METABOLIC PANEL
Anion gap: 7 (ref 5–15)
BUN: 18 mg/dL (ref 6–20)
CHLORIDE: 110 mmol/L (ref 101–111)
CO2: 21 mmol/L — ABNORMAL LOW (ref 22–32)
CREATININE: 1.15 mg/dL (ref 0.61–1.24)
Calcium: 8.1 mg/dL — ABNORMAL LOW (ref 8.9–10.3)
GFR calc Af Amer: >60 mL/min (ref >60)
GFR calc non Af Amer: 54 mL/min — ABNORMAL LOW (ref >60)
GLUCOSE: 100 mg/dL — AB (ref 65–99)
Potassium: 3.2 mmol/L — ABNORMAL LOW (ref 3.5–5.1)
SODIUM: 138 mmol/L (ref 135–145)

## 2016-03-20 LAB — CBC
HEMATOCRIT: 21.9 % — AB (ref 40.0–52.0)
Hemoglobin: 7.6 g/dL — ABNORMAL LOW (ref 13.0–18.0)
MCH: 31.8 pg (ref 26.0–34.0)
MCHC: 34.5 g/dL (ref 32.0–36.0)
MCV: 92.2 fL (ref 80.0–100.0)
PLATELETS: 185 10*3/uL (ref 150–440)
RBC: 2.38 MIL/uL — ABNORMAL LOW (ref 4.40–5.90)
RDW: 14.8 % — AB (ref 11.5–14.5)
WBC: 5.7 10*3/uL (ref 3.8–10.6)

## 2016-03-20 LAB — MRSA PCR SCREENING: MRSA by PCR: NEGATIVE

## 2016-03-20 LAB — HEMOGLOBIN: HEMOGLOBIN: 7.5 g/dL — AB (ref 13.0–18.0)

## 2016-03-20 MED ORDER — DIVALPROEX SODIUM 125 MG PO CSDR
250.0000 mg | DELAYED_RELEASE_CAPSULE | Freq: Two times a day (BID) | ORAL | Status: DC
  Administered 2016-03-20 – 2016-03-22 (×6): 250 mg via ORAL
  Filled 2016-03-20 (×6): qty 2

## 2016-03-20 MED ORDER — ACETAMINOPHEN 325 MG PO TABS: 650.0000 mg | ORAL_TABLET | Freq: Four times a day (QID) | ORAL | Status: DC | PRN

## 2016-03-20 MED ORDER — MELATONIN 5 MG PO TABS
5.0000 mg | ORAL_TABLET | Freq: Every day | ORAL | Status: DC
  Administered 2016-03-20 – 2016-03-21 (×3): 5 mg via ORAL
  Filled 2016-03-20 (×4): qty 1

## 2016-03-20 MED ORDER — LORAZEPAM 0.5 MG PO TABS
0.2500 mg | ORAL_TABLET | Freq: Two times a day (BID) | ORAL | Status: DC
  Administered 2016-03-20 (×2): 0.25 mg via ORAL
  Filled 2016-03-20 (×2): qty 1

## 2016-03-20 MED ORDER — ROSUVASTATIN CALCIUM 5 MG PO TABS
5.0000 mg | ORAL_TABLET | Freq: Every day | ORAL | Status: DC
  Administered 2016-03-20 – 2016-03-22 (×3): 5 mg via ORAL
  Filled 2016-03-20 (×3): qty 1

## 2016-03-20 MED ORDER — TAMSULOSIN HCL 0.4 MG PO CAPS
0.4000 mg | ORAL_CAPSULE | Freq: Every day | ORAL | Status: DC
  Administered 2016-03-20 – 2016-03-21 (×2): 0.4 mg via ORAL
  Filled 2016-03-20 (×2): qty 1

## 2016-03-20 MED ORDER — ACETAMINOPHEN 650 MG RE SUPP: 650.0000 mg | Freq: Four times a day (QID) | RECTAL | Status: DC | PRN

## 2016-03-20 MED ORDER — SODIUM CHLORIDE 0.9% FLUSH
3.0000 mL | Freq: Two times a day (BID) | INTRAVENOUS | Status: DC
  Administered 2016-03-20 – 2016-03-22 (×5): 3 mL via INTRAVENOUS

## 2016-03-20 MED ORDER — ONDANSETRON HCL 4 MG/2ML IJ SOLN: 4.0000 mg | Freq: Four times a day (QID) | INTRAMUSCULAR | Status: DC | PRN

## 2016-03-20 MED ORDER — LEVOTHYROXINE SODIUM 50 MCG PO TABS
50.0000 ug | ORAL_TABLET | Freq: Every day | ORAL | Status: DC
  Administered 2016-03-20 – 2016-03-22 (×3): 50 ug via ORAL
  Filled 2016-03-20 (×3): qty 1

## 2016-03-20 MED ORDER — ONDANSETRON HCL 4 MG PO TABS: 4.0000 mg | ORAL_TABLET | Freq: Four times a day (QID) | ORAL | Status: DC | PRN

## 2016-03-20 MED ORDER — AMIODARONE HCL 200 MG PO TABS
100.0000 mg | ORAL_TABLET | Freq: Every day | ORAL | Status: DC
  Administered 2016-03-20 – 2016-03-22 (×3): 100 mg via ORAL
  Filled 2016-03-20 (×4): qty 1

## 2016-03-20 MED ORDER — DONEPEZIL HCL 5 MG PO TABS
5.0000 mg | ORAL_TABLET | Freq: Every day | ORAL | Status: DC
  Administered 2016-03-20 – 2016-03-21 (×3): 5 mg via ORAL
  Filled 2016-03-20 (×3): qty 1

## 2016-03-20 MED ORDER — FINASTERIDE 5 MG PO TABS
5.0000 mg | ORAL_TABLET | Freq: Every day | ORAL | Status: DC
  Administered 2016-03-20 – 2016-03-22 (×3): 5 mg via ORAL
  Filled 2016-03-20 (×4): qty 1

## 2016-03-20 MED ORDER — BICALUTAMIDE 50 MG PO TABS
50.0000 mg | ORAL_TABLET | Freq: Every day | ORAL | Status: DC
  Administered 2016-03-20 – 2016-03-22 (×3): 50 mg via ORAL
  Filled 2016-03-20 (×4): qty 1

## 2016-03-20 MED ORDER — LORAZEPAM 0.5 MG PO TABS
0.2500 mg | ORAL_TABLET | Freq: Two times a day (BID) | ORAL | Status: DC | PRN
  Administered 2016-03-21: 01:00:00 0.25 mg via ORAL
  Filled 2016-03-20: qty 1

## 2016-03-20 MED ORDER — ESCITALOPRAM OXALATE 10 MG PO TABS
5.0000 mg | ORAL_TABLET | Freq: Every day | ORAL | Status: DC
  Administered 2016-03-20 – 2016-03-22 (×3): 5 mg via ORAL
  Filled 2016-03-20 (×4): qty 1

## 2016-03-20 NOTE — Progress Notes (Signed)
[place] at [place] NAME: Adam Nielsen    MR#:  [id number]  DATE OF BIRTH:  April 29, 1925  SUBJECTIVE:  CHIEF COMPLAINT:   Chief Complaint  Patient presents with  . Emesis  . Diarrhea   No further vomiting or diarrhea. Vision is confused with dementia  REVIEW OF SYSTEMS:    Review of Systems  Unable to perform ROS: dementia    DRUG ALLERGIES:  No Known Allergies  VITALS:  Blood pressure 101/48, pulse 64, temperature 98.4 F (36.9 C), temperature source Oral, resp. rate 20, height 5\' 5"  (1.651 m), weight 67.994 kg (149 lb 14.4 oz), SpO2 96 %.  PHYSICAL EXAMINATION:   Physical Exam  GENERAL:  82 y.o.-year-old patient lying in the bed with no acute distress.  EYES: Pupils equal, round, reactive to light and accommodation. No scleral icterus. Extraocular muscles intact. Pallor positive HEENT: Head atraumatic, normocephalic. Oropharynx and nasopharynx clear.  NECK:  Supple, no jugular venous distention. No thyroid enlargement, no tenderness.  LUNGS: Normal breath sounds bilaterally, no wheezing, rales, rhonchi. No use of accessory muscles of respiration.  CARDIOVASCULAR: S1, S2 normal. No murmurs, rubs, or gallops.  ABDOMEN: Soft, nontender, nondistended. Bowel sounds present. No organomegaly or mass.  EXTREMITIES: No cyanosis, clubbing or edema b/l.    NEUROLOGIC: Moves all 4 extremities PSYCHIATRIC: The patient is drowzy SKIN: No obvious rash, lesion, or ulcer.   LABORATORY PANEL:   CBC  Recent Labs Lab 03/20/16 0718  WBC 5.7  HGB 7.6*  HCT 21.9*  PLT 185   ------------------------------------------------------------------------------------------------------------------ Chemistries   Recent Labs Lab 03/19/16 2014 03/20/16 0718  NA 137 138  K 3.4* 3.2*  CL 106 110  CO2 21* 21*  GLUCOSE 147* 100*  BUN 21* 18  CREATININE 1.49* 1.15  CALCIUM 8.9 8.1*  AST 27  --   ALT 19  --   ALKPHOS 53  --    BILITOT 0.4  --    ------------------------------------------------------------------------------------------------------------------  Cardiac Enzymes No results for input(s): TROPONINI in the last 168 hours. ------------------------------------------------------------------------------------------------------------------  RADIOLOGY:  No results found.   ASSESSMENT AND PLAN:   * Normocytic Anemia GI bleed suspected. Check stool for Hemoccult. 1 unit PRBC transfused Hemoglobin 7.6 We'll repeat labs in the morning Discussed with family regarding plan Likely discharge tomorrow if hemoglobin is stable and no further bleeding noticed  * Dementia Watch for inpatient delirium  * Paroxysmal atrial fibrillation  * DVT prophylaxis with Lovenox  All the records are reviewed and case discussed with Care Management/Social Workerr. Management plans discussed with the patient, family and they are in agreement.  CODE STATUS: DNR  DVT Prophylaxis: SCDs  TOTAL TIME TAKING CARE OF THIS PATIENT: 35 minutes.   POSSIBLE D/C IN 1-2 DAYS, DEPENDING ON CLINICAL CONDITION.  Hillary Bow R M.D on 03/20/2016 at 10:36 AM  Between 7am to 6pm - Pager - [phone]  After 6pm go to [email or web address] - password [email or web address] [facility]  Office  [phone]  CC: Primary care physician; Pcp Not In System  Note: This dictation was prepared with Dragon dictation along with smaller phrase technology. Any transcriptional errors that result from this process are unintentional.

## 2016-03-20 NOTE — Progress Notes (Signed)
Pt received one unit of PRBC.  Tolerated well.   Pts son says his father's health has been declining since January.  Pt had been driving up until then.   Since pts TURP and MI in March, he has gotten increasingly worse.   Pt is confused and anxious at times.  He has not been impulsive.   No bowel movements since arrival.

## 2016-03-20 NOTE — Plan of Care (Signed)
Problem: Fluid Volume: Goal: Will show no signs and symptoms of excessive bleeding Outcome: Progressing No bloody stools .   i unit of blood received during the night.  hbg 7.6 this m / another hgb for 1700. And in am. Will cont to moniter pt.

## 2016-03-20 NOTE — Clinical Social Work Note (Signed)
Clinical Social Work Assessment  Patient Details  Name: Adam Nielsen MRN: [id number] Date of Birth: 1925-07-24  Date of referral:  03/20/16               Reason for consult:  Facility Placement                Permission sought to share information with:  Family Supports Permission granted to share information::  Yes, Verbal Permission Granted  Name::     Weber Amado  Relationship::  son  Contact Information:  [phone]  Housing/Transportation Living arrangements for the past 2 months:  [place] of Information:  Adult Children Patient Interpreter Needed:  None Criminal Activity/Legal Involvement Pertinent to Current Situation/Hospitalization:  No - Comment as needed Significant Relationships:  Adult Children Lives with:  Facility Resident Do you feel safe going back to the place where you live?  Yes Need for family participation in patient care:  Yes (Comment)  Care giving concerns:  No care giving concerns identified.   Social Worker assessment / plan:  CSW spoke with pt's son to address consult. CSW introduced herself and explained role of social work. CSW also explained the process of discharging back to the facility. CSW shared that she will speak with the facility closer to discharge. Pt's son shared that pt has been at [facility] since January 2017, and recently [facility] to the [facility]. Pt's son would like for pt to go back to ALF and has been working with the facility on the transition. CSW will continue to follow.   Employment status:  Retired Nurse, adult PT Recommendations:  Not assessed at this time Information / Referral to community resources:  Other (Comment Required) ([facility] )  Patient/Family's Response to care:  Pt's son was appreciative of CSW support.   Patient/Family's Understanding of and Emotional Response to Diagnosis, Current Treatment, and Prognosis:  Pt's son is agreeable to pt return at  discharge plan   Emotional Assessment Appearance:  Other (Comment Required Attitude/Demeanor/Rapport:  Other Affect (typically observed):  Other Orientation:  Oriented to Self, Fluctuating Orientation (Suspected and/or reported Sundowners) Alcohol / Substance use:  Never Used Psych involvement (Current and /or in the community):  No (Comment)  Discharge Needs  Concerns to be addressed:  Adjustment to Illness Readmission within the last 30 days:  Yes Current discharge risk:  Chronically ill Barriers to Discharge:  Continued Medical Work up   [employer], LCSW 03/20/2016, 1:50 PM

## 2016-03-21 LAB — HEMOGLOBIN AND HEMATOCRIT, BLOOD
HCT: 25.2 % — ABNORMAL LOW (ref 40.0–52.0)
Hemoglobin: 8.5 g/dL — ABNORMAL LOW (ref 13.0–18.0)

## 2016-03-21 LAB — PREPARE RBC (CROSSMATCH)

## 2016-03-21 LAB — HEMOGLOBIN: HEMOGLOBIN: 7.3 g/dL — AB (ref 13.0–18.0)

## 2016-03-21 MED ORDER — SODIUM CHLORIDE 0.9 % IV SOLN
Freq: Once | INTRAVENOUS | Status: AC
Start: 2016-03-21 — End: 2016-03-21
  Administered 2016-03-21: 14:00:00 via INTRAVENOUS

## 2016-03-21 MED ORDER — PANTOPRAZOLE SODIUM 40 MG PO TBEC
40.0000 mg | DELAYED_RELEASE_TABLET | Freq: Two times a day (BID) | ORAL | Status: DC
  Administered 2016-03-21 – 2016-03-22 (×2): 40 mg via ORAL
  Filled 2016-03-21 (×2): qty 1

## 2016-03-21 NOTE — Care Management (Addendum)
Admitted to [facility] with the diagnosis of GI Bleed. A resident of [facility] since 09/30/15. Son is [name] [phone] or [phone]). Dr. Perrin Smack is listed as primary care physician.  IV Protonix continues. Hgb 7.3. Full liquid diet.  Shelbie Ammons RN MSN [facility] Care Management [phone]

## 2016-03-21 NOTE — Progress Notes (Signed)
[place] at [place] NAME: Basheer Ragucci    MR#:  [id number]  DATE OF BIRTH:  1925/06/29  SUBJECTIVE:  CHIEF COMPLAINT:   Chief Complaint  Patient presents with  . Emesis  . Diarrhea   No further vomiting or diarrhea. confused with dementia.  REVIEW OF SYSTEMS:    Review of Systems  Unable to perform ROS: dementia    DRUG ALLERGIES:  No Known Allergies  VITALS:  Blood pressure 98/42, pulse 60, temperature 98.6 F (37 C), temperature source Oral, resp. rate 18, height 5\' 5"  (1.651 m), weight 67.994 kg (149 lb 14.4 oz), SpO2 95 %.  PHYSICAL EXAMINATION:   Physical Exam  GENERAL:  82 y.o.-year-old patient lying in the bed with no acute distress.  EYES: Pupils equal, round, reactive to light and accommodation. No scleral icterus. Extraocular muscles intact. Pallor positive HEENT: Head atraumatic, normocephalic. Oropharynx and nasopharynx clear.  NECK:  Supple, no jugular venous distention. No thyroid enlargement, no tenderness.  LUNGS: Normal breath sounds bilaterally, no wheezing, rales, rhonchi. No use of accessory muscles of respiration.  CARDIOVASCULAR: S1, S2 normal. No murmurs, rubs, or gallops.  ABDOMEN: Soft, nontender, nondistended. Bowel sounds present. No organomegaly or mass.  EXTREMITIES: No cyanosis, clubbing or edema b/l.    NEUROLOGIC: Moves all 4 extremities PSYCHIATRIC: The patient is drowzy SKIN: No obvious rash, lesion, or ulcer.   LABORATORY PANEL:   CBC  Recent Labs Lab 03/20/16 0718  03/21/16 0544  WBC 5.7  --   --   HGB 7.6*  < > 7.3*  HCT 21.9*  --   --   PLT 185  --   --   < > = values in this interval not displayed. ------------------------------------------------------------------------------------------------------------------ Chemistries   Recent Labs Lab 03/19/16 2014 03/20/16 0718  NA 137 138  K 3.4* 3.2*  CL 106 110  CO2 21* 21*  GLUCOSE 147* 100*  BUN 21* 18   CREATININE 1.49* 1.15  CALCIUM 8.9 8.1*  AST 27  --   ALT 19  --   ALKPHOS 53  --   BILITOT 0.4  --    ------------------------------------------------------------------------------------------------------------------  Cardiac Enzymes No results for input(s): TROPONINI in the last 168 hours. ------------------------------------------------------------------------------------------------------------------  RADIOLOGY:  No results found.   ASSESSMENT AND PLAN:   * Normocytic Anemia Stool for hemoccult done at bedside is positive. 1 unit PRBC transfused. Hemoglobin 7.3 We'll repeat labs in the morning Discussed with family regarding plan. Son not keen on EGD/Colonoscopy. Wants to see how patient does. Will transfuse 1 unit PRBC today. D/C back to ALF in AM  * Dementia Watch for inpatient delirium  * Paroxysmal atrial fibrillation  * DVT prophylaxis with Lovenox  All the records are reviewed and case discussed with Care Management/Social Workerr. Management plans discussed with the patient, family and they are in agreement.  CODE STATUS: DNR  DVT Prophylaxis: SCDs  TOTAL TIME TAKING CARE OF THIS PATIENT: 35 minutes.   POSSIBLE D/C IN 1-2 DAYS, DEPENDING ON CLINICAL CONDITION.  Adam Nielsen R M.D on 03/21/2016 at 1:43 PM  Between 7am to 6pm - Pager - [phone]  After 6pm go to [email or web address] - password [email or web address] [facility]  Office  [phone]  CC: Primary care physician; Pcp Not In System  Note: This dictation was prepared with Dragon dictation along with smaller phrase technology. Any transcriptional errors that result from this process are unintentional.

## 2016-03-21 NOTE — Care Management Important Message (Signed)
Important Message  Patient Details  Name: Adam Nielsen MRN: [id number] Date of Birth: [birth year]   Medicare Important Message Given:  Yes    [name] 03/21/2016, 10:44 AM

## 2016-03-21 NOTE — Progress Notes (Signed)
Pt pleasantly confused.  He pulled out both IVs during the night, one reinsertion achieved.  However, he is not impulsive or combative.

## 2016-03-21 NOTE — Progress Notes (Signed)
Patient is confused, baseline of dementia, sleeping in between care, feeder- fair appetite, incontinent of urine, rectal exam performed by MD, stool card positive for occult blood per MD, 1 unit blood infused, recheck hgb and reevaluate if patient is appropriate for discharge tomorrow, updated provided to son via RN and MD, son does not wish to have any invasive testing done at this time. IV Protonix d/c, pt on po Protonix.

## 2016-03-21 NOTE — Clinical Documentation Improvement (Signed)
Internal Medicine  Can the diagnosis of "normocytic anemia" be further specified as this term codes to anemia - unspecified? Please document findings in next progress note. Thank you!   Acute Blood Loss Anemia  Acute on Chronic Blood Loss Anemia  Chronic Blood Loss Anemia, including the suspected or known cause  Iron deficiency Anemia  Nutritional anemia, including the nutrition or mineral deficits  Anemia of chronic disease, including the associated chronic disease state  Other  Clinically Undetermined  Document any associated diagnoses/conditions.  Supporting Information:  Hgb on admission 7.6  Transfused 2 units PRBC's  Please exercise your independent, professional judgment when responding. A specific answer is not anticipated or expected.  Thank You,  Norm Parcel RN, BSN, [place] Clinical Documentation Specialist [facility] [phone]; cell [phone]

## 2016-03-22 LAB — URINALYSIS COMPLETE WITH MICROSCOPIC (ARMC ONLY)
Bacteria, UA: NONE SEEN
Bilirubin Urine: NEGATIVE
GLUCOSE, UA: NEGATIVE mg/dL
Hgb urine dipstick: NEGATIVE
Leukocytes, UA: NEGATIVE
NITRITE: NEGATIVE
Protein, ur: NEGATIVE mg/dL
SPECIFIC GRAVITY, URINE: 1.015 (ref 1.005–1.030)
Squamous Epithelial / LPF: NONE SEEN
pH: 5 (ref 5.0–8.0)

## 2016-03-22 LAB — TYPE AND SCREEN
ABO/RH(D): O POS
ANTIBODY SCREEN: NEGATIVE
Unit division: 0
Unit division: 0

## 2016-03-22 LAB — HEMOGLOBIN AND HEMATOCRIT, BLOOD
HCT: 24.8 % — ABNORMAL LOW (ref 40.0–52.0)
Hemoglobin: 8.7 g/dL — ABNORMAL LOW (ref 13.0–18.0)

## 2016-03-22 MED ORDER — PANTOPRAZOLE SODIUM 40 MG PO TBEC: 40.0000 mg | DELAYED_RELEASE_TABLET | Freq: Every day | ORAL | Status: AC

## 2016-03-22 MED ORDER — FERROUS SULFATE 325 (65 FE) MG PO TABS: 325.0000 mg | ORAL_TABLET | Freq: Two times a day (BID) | ORAL | Status: AC

## 2016-03-22 NOTE — Progress Notes (Signed)
Pt is alert with confusion, daughters at bedside, fair appetite, pt is d/c to [facility] assisted living, DNR given to patient's daughter, reviewed d/c instructions with patient's daughters, family reports understanding of d/c instructions, patient should f/u with pcp in 1 week and should stop taking eliquis. Hgb stable, patient asleep in between care. Patient d/c via daughters, pushed to visitor entrance via nursing staff.

## 2016-03-22 NOTE — NC FL2 (Signed)
[place] LEVEL OF CARE SCREENING TOOL     IDENTIFICATION  Patient Name: Adam Nielsen Birthdate: 1925-03-14 Sex: male Admission Date (Current Location): 03/19/2016  [facility] and [place] Number:  Engineering geologist and Address:         Provider Number: [id number]  Attending Physician Name and Address:  Hillary Bow, MD  Relative Name and Phone Number:       Current Level of Care: Hospital Recommended Level of Care: Memory Care Prior Approval Number:    Date Approved/Denied:   PASRR Number:    Discharge Plan: Domiciliary (Rest home)    Current Diagnoses: Patient Active Problem List   Diagnosis Date Noted  . GI bleed 03/19/2016  . CAD (coronary artery disease) 03/19/2016  . Pressure ulcer 12/18/2015  . Urinary retention   . Paroxysmal atrial fibrillation (HCC)   . Typical atrial flutter ([place])   . NSTEMI (non-ST elevated myocardial infarction) ([place]) 12/14/2015  . Atrial fibrillation with rapid ventricular response ([place])   . S/P TURP   . Hematuria   . S/P CABG (coronary artery bypass graft)   . Hypotension   . BPH (benign prostatic hypertrophy) with urinary retention 12/12/2015  . UTI (urinary tract infection) 11/23/2015  . UTI (lower urinary tract infection) 11/22/2015  . Prostate cancer ([place]) 11/22/2015  . Hypothyroidism 11/22/2015  . Atrial fibrillation ([place]) 11/22/2015  . Dementia 11/22/2015  . Fall 11/22/2015  . Bronchitis 11/22/2015    Orientation RESPIRATION BLADDER Height & Weight     Self  O2 (2L) Incontinent Weight: 149 lb 14.4 oz (67.994 kg) Height:  5\' 5"  (165.1 cm)  BEHAVIORAL SYMPTOMS/MOOD NEUROLOGICAL BOWEL NUTRITION STATUS      Incontinent Regular Diet  AMBULATORY STATUS COMMUNICATION OF NEEDS Skin   Limited Assist Administrator, [employer]) Verbally Normal                       Personal Care Assistance Level of Assistance  Bathing, Feeding, Dressing Bathing Assistance: Limited assistance Feeding assistance: Limited  assistance Dressing Assistance: Limited assistance     Functional Limitations Info  Sight, Hearing, Speech Sight Info: Adequate Hearing Info: Adequate Speech Info: Adequate    SPECIAL CARE FACTORS FREQUENCY                       Contractures      Additional Factors Info  Code Status, Allergies Code Status Info: DNR  Allergies Info: No known allergies           Discharge Medications: Please see discharge summary for a list of discharge medications.  Current Discharge Medication List    START taking these medications   Details  ferrous sulfate 325 (65 FE) MG tablet Take 1 tablet (325 mg total) by mouth 2 (two) times daily with a meal. Qty: 180 tablet, Refills: 0    pantoprazole (PROTONIX) 40 MG tablet Take 1 tablet (40 mg total) by mouth daily. Qty: 30 tablet, Refills: 0      CONTINUE these medications which have NOT CHANGED   Details  acetaminophen (TYLENOL) 500 MG tablet Take 500 mg by mouth every 4 (four) hours as needed.    albuterol (PROVENTIL HFA;VENTOLIN HFA) 108 (90 Base) MCG/ACT inhaler Inhale 2 puffs into the lungs every 6 (six) hours as needed for wheezing or shortness of breath. Qty: 1 Inhaler, Refills: 0    alum & mag hydroxide-simeth (MINTOX) [id number] MG/5ML suspension Take 30 mLs by mouth as needed for indigestion  or heartburn.    Amino Acids-Protein Hydrolys (FEEDING SUPPLEMENT, PRO-STAT SUGAR FREE 64,) LIQD Take 30 mLs by mouth 2 (two) times daily.    amiodarone (PACERONE) 100 MG tablet Take 1 tablet (100 mg total) by mouth daily. Qty: 30 tablet, Refills: 6   Associated Diagnoses: Atrial fibrillation, unspecified type [facility]); NSTEMI (non-ST elevated myocardial infarction) (HCC)    bicalutamide (CASODEX) 50 MG tablet Take 50 mg by mouth daily. Reported on 12/13/2015    divalproex (DEPAKOTE SPRINKLE) 125 MG capsule Take 250 mg by mouth 2 (two) times daily.    donepezil (ARICEPT) 5 MG tablet Take 5 mg  by mouth at bedtime.    escitalopram (LEXAPRO) 5 MG tablet Take 5 mg by mouth daily.    fexofenadine (ALLEGRA) 180 MG tablet Take 180 mg by mouth daily.    finasteride (PROSCAR) 5 MG tablet Take 5 mg by mouth daily.    furosemide (LASIX) 20 MG tablet Take 1 tablet (20 mg total) by mouth daily. Qty: 30 tablet, Refills: 0    guaifenesin (ROBITUSSIN) 100 MG/5ML syrup Take 200 mg by mouth every 6 (six) hours as needed for cough.    hydrocortisone 2.5 % cream Apply topically 2 (two) times daily as needed.    levothyroxine (SYNTHROID, LEVOTHROID) 50 MCG tablet Take 50 mcg by mouth daily before breakfast.    lisinopril (PRINIVIL,ZESTRIL) 2.5 MG tablet Take 1 tablet (2.5 mg total) by mouth daily. Qty: 30 tablet, Refills: 0    loperamide (IMODIUM) 2 MG capsule Take 2 mg by mouth as needed for diarrhea or loose stools.    LORazepam (ATIVAN) 0.5 MG tablet Take 0.25 mg by mouth 2 (two) times daily.    magnesium hydroxide (MILK OF MAGNESIA) 400 MG/5ML suspension Take 30 mLs by mouth at bedtime as needed for mild constipation.    Melatonin 3 MG TABS Take 3 mg by mouth at bedtime.    miconazole (MICOTIN) 2 % cream Apply 1 application topically 3 (three) times daily as needed.    Multiple Vitamin (MULTIVITAMIN WITH MINERALS) TABS tablet Take 1 tablet by mouth daily.    neomycin-bacitracin-polymyxin (NEOSPORIN) ointment Apply 1 application topically as needed for wound care. apply to eye    rosuvastatin (CRESTOR) 5 MG tablet Take 1 tablet (5 mg total) by mouth daily at 6 PM. Qty: 30 tablet, Refills: 0    Skin Protectants, Misc. (MINERIN) CREA Apply topically 2 (two) times daily.    tamsulosin (FLOMAX) 0.4 MG CAPS capsule Take 1 capsule (0.4 mg total) by mouth daily after supper. Qty: 30 capsule, Refills: 0    triamcinolone cream (KENALOG) 0.5 % Apply 1 application topically daily.      STOP taking these medications      apixaban (ELIQUIS) 5 MG TABS tablet              Relevant Imaging Results:  Relevant Lab Results:   Additional Information SSN: [id number]  Darden Dates, LCSW

## 2016-03-22 NOTE — Discharge Summary (Signed)
[facility] NAME: Adam Nielsen    MR#:  [id number]  DATE OF BIRTH:  05-30-1925  DATE OF ADMISSION:  03/19/2016 ADMITTING PHYSICIAN: Lance Coon, MD  DATE OF DISCHARGE: No discharge date for patient encounter.  PRIMARY CARE PHYSICIAN: Pcp Not In System   ADMISSION DIAGNOSIS:  Gastrointestinal hemorrhage, unspecified gastritis, unspecified gastrointestinal hemorrhage type [K92.2]  DISCHARGE DIAGNOSIS:  Principal Problem:   GI bleed Active Problems:   Hypothyroidism   Dementia   BPH (benign prostatic hypertrophy) with urinary retention   Paroxysmal atrial fibrillation (HCC)   CAD (coronary artery disease)   SECONDARY DIAGNOSIS:   Past Medical History  Diagnosis Date  . PAF (paroxysmal atrial fibrillation) (HCC)     a. not on long term full dose anticoagulation  . Hypothyroid   . Prostate CA ([place])   . Dementia   . Acute bronchitis 11/22/2015   . Chronic indwelling Foley catheter   . BPH (benign prostatic hypertrophy)   . Hyperlipidemia   . Urinary tract infection 11/22/2015   . Fall     11/22/2015    . CAD (coronary artery disease)     a. s/p CABG approx 1997; b. patient of Dr. Wynonia Lawman, MD  . Dementia      ADMITTING HISTORY  Adam Nielsen is a 82 y.o. male who presents with An episode of significant diarrhea. He was brought to the ED for evaluation from his nursing facility for the same. Here he was found to have a hemoglobin of 7.8 which is down from his baseline of 11-12. He is on anticoagulation. He was ordered for transfusion of packed red blood cells in the ED, and hospitalists were called for admission. We do not currently have GI support in this hospital over the next day or so, though the family opted not to have any invasive intervention at this time either way.  HOSPITAL COURSE:   * Normocytic Anemia Stool for hemoccult done at bedside is positive. 2 units PRBC transfused. Hemoglobin 7.3 We'll repeat  labs in the morning Discussed with family regarding plan. Son not keen on EGD/Colonoscopy. Wants to see how patient does. D/C back to ALF now that Hb is stable  This was likely diverticular bleeding. Eliquis held. Start iron supplements. Repeat labs in 4-5 days.  * Dementia Watch for inpatient delirium  * Paroxysmal atrial fibrillation Eliquis held after discussing with son regarding risk vs benfit due to gi bleed and anemia  * DVT prophylaxis SCDs  Stable for discharge to ALF   CONSULTS OBTAINED:     DRUG ALLERGIES:  No Known Allergies  DISCHARGE MEDICATIONS:   Current Discharge Medication List    START taking these medications   Details  ferrous sulfate 325 (65 FE) MG tablet Take 1 tablet (325 mg total) by mouth 2 (two) times daily with a meal. Qty: 180 tablet, Refills: 0    pantoprazole (PROTONIX) 40 MG tablet Take 1 tablet (40 mg total) by mouth daily. Qty: 30 tablet, Refills: 0      CONTINUE these medications which have NOT CHANGED   Details  acetaminophen (TYLENOL) 500 MG tablet Take 500 mg by mouth every 4 (four) hours as needed.    albuterol (PROVENTIL HFA;VENTOLIN HFA) 108 (90 Base) MCG/ACT inhaler Inhale 2 puffs into the lungs every 6 (six) hours as needed for wheezing or shortness of breath. Qty: 1 Inhaler, Refills: 0    alum & mag hydroxide-simeth (MINTOX) 200-200-20 MG/5ML suspension Take  30 mLs by mouth as needed for indigestion or heartburn.    Amino Acids-Protein Hydrolys (FEEDING SUPPLEMENT, PRO-STAT SUGAR FREE 64,) LIQD Take 30 mLs by mouth 2 (two) times daily.    amiodarone (PACERONE) 100 MG tablet Take 1 tablet (100 mg total) by mouth daily. Qty: 30 tablet, Refills: 6   Associated Diagnoses: Atrial fibrillation, unspecified type [facility]); NSTEMI (non-ST elevated myocardial infarction) (HCC)    bicalutamide (CASODEX) 50 MG tablet Take 50 mg by mouth daily. Reported on 12/13/2015    divalproex (DEPAKOTE SPRINKLE) 125 MG capsule Take 250 mg by mouth  2 (two) times daily.    donepezil (ARICEPT) 5 MG tablet Take 5 mg by mouth at bedtime.    escitalopram (LEXAPRO) 5 MG tablet Take 5 mg by mouth daily.    fexofenadine (ALLEGRA) 180 MG tablet Take 180 mg by mouth daily.    finasteride (PROSCAR) 5 MG tablet Take 5 mg by mouth daily.    furosemide (LASIX) 20 MG tablet Take 1 tablet (20 mg total) by mouth daily. Qty: 30 tablet, Refills: 0    guaifenesin (ROBITUSSIN) 100 MG/5ML syrup Take 200 mg by mouth every 6 (six) hours as needed for cough.    hydrocortisone 2.5 % cream Apply topically 2 (two) times daily as needed.    levothyroxine (SYNTHROID, LEVOTHROID) 50 MCG tablet Take 50 mcg by mouth daily before breakfast.    lisinopril (PRINIVIL,ZESTRIL) 2.5 MG tablet Take 1 tablet (2.5 mg total) by mouth daily. Qty: 30 tablet, Refills: 0    loperamide (IMODIUM) 2 MG capsule Take 2 mg by mouth as needed for diarrhea or loose stools.    LORazepam (ATIVAN) 0.5 MG tablet Take 0.25 mg by mouth 2 (two) times daily.    magnesium hydroxide (MILK OF MAGNESIA) 400 MG/5ML suspension Take 30 mLs by mouth at bedtime as needed for mild constipation.    Melatonin 3 MG TABS Take 3 mg by mouth at bedtime.    miconazole (MICOTIN) 2 % cream Apply 1 application topically 3 (three) times daily as needed.    Multiple Vitamin (MULTIVITAMIN WITH MINERALS) TABS tablet Take 1 tablet by mouth daily.    neomycin-bacitracin-polymyxin (NEOSPORIN) ointment Apply 1 application topically as needed for wound care. apply to eye    rosuvastatin (CRESTOR) 5 MG tablet Take 1 tablet (5 mg total) by mouth daily at 6 PM. Qty: 30 tablet, Refills: 0    Skin Protectants, Misc. (MINERIN) CREA Apply topically 2 (two) times daily.    tamsulosin (FLOMAX) 0.4 MG CAPS capsule Take 1 capsule (0.4 mg total) by mouth daily after supper. Qty: 30 capsule, Refills: 0    triamcinolone cream (KENALOG) 0.5 % Apply 1 application topically daily.      STOP taking these medications      apixaban (ELIQUIS) 5 MG TABS tablet         Today   VITAL SIGNS:  Blood pressure 131/82, pulse 56, temperature 98.9 F (37.2 C), temperature source Oral, resp. rate 17, height 5\' 5"  (1.651 m), weight 67.994 kg (149 lb 14.4 oz), SpO2 95 %.  I/O:   Intake/Output Summary (Last 24 hours) at 03/22/16 0925 Last data filed at 03/21/16 1600  Gross per 24 hour  Intake    580 ml  Output      0 ml  Net    580 ml    PHYSICAL EXAMINATION:  Physical Exam  GENERAL:  82 y.o.-year-old patient lying in the bed with no acute distress.  LUNGS: Normal breath sounds  bilaterally, no wheezing, rales,rhonchi or crepitation. No use of accessory muscles of respiration.  CARDIOVASCULAR: S1, S2 . Systolic murmur ABDOMEN: Soft, non-tender, non-distended. Bowel sounds present. No organomegaly or mass.  NEUROLOGIC: Moves all 4 extremities. PSYCHIATRIC: The patient is confused  DATA REVIEW:   CBC  Recent Labs Lab 03/20/16 0718  03/22/16 0626  WBC 5.7  --   --   HGB 7.6*  < > 8.7*  HCT 21.9*  < > 24.8*  PLT 185  --   --   < > = values in this interval not displayed.  Chemistries   Recent Labs Lab 03/19/16 2014 03/20/16 0718  NA 137 138  K 3.4* 3.2*  CL 106 110  CO2 21* 21*  GLUCOSE 147* 100*  BUN 21* 18  CREATININE 1.49* 1.15  CALCIUM 8.9 8.1*  AST 27  --   ALT 19  --   ALKPHOS 53  --   BILITOT 0.4  --     Cardiac Enzymes No results for input(s): TROPONINI in the last 168 hours.  Microbiology Results  Results for orders placed or performed during the hospital encounter of 03/19/16  MRSA PCR Screening     Status: None   Collection Time: 03/20/16  1:11 AM  Result Value Ref Range Status   MRSA by PCR NEGATIVE NEGATIVE Final    Comment:        The GeneXpert MRSA Assay (FDA approved for NASAL specimens only), is one component of a comprehensive MRSA colonization surveillance program. It is not intended to diagnose MRSA infection nor to guide or monitor treatment for MRSA  infections.     RADIOLOGY:  No results found.  Follow up with PCP in 1 week.  Management plans discussed with the patient, family and they are in agreement.  CODE STATUS:     Code Status Orders        Start     Ordered   03/20/16 0030  Do not attempt resuscitation (DNR)   Continuous    Question Answer Comment  In the event of cardiac or respiratory ARREST Do not call a "code blue"   In the event of cardiac or respiratory ARREST Do not perform Intubation, CPR, defibrillation or ACLS   In the event of cardiac or respiratory ARREST Use medication by any route, position, wound care, and other measures to relive pain and suffering. May use oxygen, suction and manual treatment of airway obstruction as needed for comfort.      03/20/16 0029    Code Status History    Date Active Date Inactive Code Status Order ID Comments User Context   12/14/2015  1:06 PM 12/21/2015  7:37 PM DNR [id number]  Gladstone Lighter, MD ED   12/14/2015  4:26 AM 12/14/2015  1:06 PM Full Code [id number]  Harrie Foreman, MD ED   12/12/2015 12:00 PM 12/13/2015  4:35 PM Full Code [id number]  Kathie Rhodes, MD Inpatient   11/22/2015 11:40 PM 11/25/2015  2:43 PM Full Code [id number]  Lance Coon, MD Inpatient    Advance Directive Documentation        Most Recent Value   Type of Advance Directive  Healthcare Power of [place], Living will   Pre-existing out of facility DNR order (yellow form or pink MOST form)     "MOST" Form in Place?        TOTAL TIME TAKING CARE OF THIS PATIENT ON DAY OF DISCHARGE: more than 30 minutes.   Neita Carp M.D on  03/22/2016 at 9:25 AM  Between 7am to 6pm - Pager - [phone]  After 6pm go to [email or web address] - password [email or web address] [facility]  Office  [phone]  CC: Primary care physician; Pcp Not In System  Note: This dictation was prepared with Dragon dictation along with smaller phrase technology. Any transcriptional errors that result from this process are  unintentional.

## 2016-03-22 NOTE — Discharge Instructions (Addendum)
°  DIET:  Regular diet  DISCHARGE CONDITION:  Stable  ACTIVITY:  Activity as tolerated  OXYGEN:  Home Oxygen: No.   Oxygen Delivery: room air  DISCHARGE LOCATION:  home   If you experience worsening of your admission symptoms, develop shortness of breath, life threatening emergency, suicidal or homicidal thoughts you must seek medical attention immediately by calling 911 or calling your MD immediately  if symptoms less severe.  You Must read complete instructions/literature along with all the possible adverse reactions/side effects for all the Medicines you take and that have been prescribed to you. Take any new Medicines after you have completely understood and accpet all the possible adverse reactions/side effects.   Please note  You were cared for by a hospitalist during your hospital stay. If you have any questions about your discharge medications or the care you received while you were in the hospital after you are discharged, you can call the unit and asked to speak with the hospitalist on call if the hospitalist that took care of you is not available. Once you are discharged, your primary care physician will handle any further medical issues. Please note that NO REFILLS for any discharge medications will be authorized once you are discharged, as it is imperative that you return to your primary care physician (or establish a relationship with a primary care physician if you do not have one) for your aftercare needs so that they can reassess your need for medications and monitor your lab values.   You need repeat Hemoglobin checked on blood work by your doctor in 4-5 days.  Stop taking Eliquis.  Please call your doctor or return if you notice any blood in your stool.

## 2016-03-22 NOTE — Clinical Social Work Note (Signed)
Pt is ready to return to [facility]. Facility is ready to admit pt. as they have received discharge information. Family will transportation. RN is aware. CSW is signing off as no further needs identified.   Darden Dates, MSW, LCSW  Clinical Social Worker  [phone]

## 2016-04-01 ENCOUNTER — Encounter: Payer: Self-pay | Admitting: Emergency Medicine

## 2016-04-01 ENCOUNTER — Emergency Department: Payer: Medicare Other

## 2016-04-01 ENCOUNTER — Emergency Department
Admission: EM | Admit: 2016-04-01 | Discharge: 2016-04-01 | Disposition: A | Discharge status: Home / Self Care | Payer: Medicare Other | Attending: Emergency Medicine | Admitting: Emergency Medicine

## 2016-04-01 DIAGNOSIS — I252 Old myocardial infarction: Secondary | ICD-10-CM | POA: Diagnosis not present

## 2016-04-01 DIAGNOSIS — Z79899 Other long term (current) drug therapy: Secondary | ICD-10-CM | POA: Diagnosis not present

## 2016-04-01 DIAGNOSIS — Y999 Unspecified external cause status: Secondary | ICD-10-CM | POA: Insufficient documentation

## 2016-04-01 DIAGNOSIS — W1839XA Other fall on same level, initial encounter: Secondary | ICD-10-CM | POA: Diagnosis not present

## 2016-04-01 DIAGNOSIS — Z87891 Personal history of nicotine dependence: Secondary | ICD-10-CM | POA: Insufficient documentation

## 2016-04-01 DIAGNOSIS — E039 Hypothyroidism, unspecified: Secondary | ICD-10-CM | POA: Diagnosis not present

## 2016-04-01 DIAGNOSIS — Y929 Unspecified place or not applicable: Secondary | ICD-10-CM | POA: Diagnosis not present

## 2016-04-01 DIAGNOSIS — E785 Hyperlipidemia, unspecified: Secondary | ICD-10-CM | POA: Insufficient documentation

## 2016-04-01 DIAGNOSIS — Y939 Activity, unspecified: Secondary | ICD-10-CM | POA: Insufficient documentation

## 2016-04-01 DIAGNOSIS — I480 Paroxysmal atrial fibrillation: Secondary | ICD-10-CM | POA: Diagnosis not present

## 2016-04-01 DIAGNOSIS — S0990XA Unspecified injury of head, initial encounter: Secondary | ICD-10-CM | POA: Diagnosis not present

## 2016-04-01 DIAGNOSIS — I2510 Atherosclerotic heart disease of native coronary artery without angina pectoris: Secondary | ICD-10-CM | POA: Diagnosis not present

## 2016-04-01 DIAGNOSIS — Z951 Presence of aortocoronary bypass graft: Secondary | ICD-10-CM | POA: Diagnosis not present

## 2016-04-01 NOTE — ED Provider Notes (Signed)
[facility] Emergency Department Provider Note  ____________________________________________   I have reviewed the triage vital signs and the nursing notes.   HISTORY  Chief Complaint Fall    HPI Adam Nielsen is a 82 y.o. male who presents today after a fall. Patient has a history of chronic falls. He is at his baseline according to family, he has no complaints.Level 5 chart caveat; no further history available due to patient status.      Past Medical History  Diagnosis Date  . PAF (paroxysmal atrial fibrillation) (HCC)     a. not on long term full dose anticoagulation  . Hypothyroid   . Prostate CA ([place])   . Dementia   . Acute bronchitis 11/22/2015   . Chronic indwelling Foley catheter   . BPH (benign prostatic hypertrophy)   . Hyperlipidemia   . Urinary tract infection 11/22/2015   . Fall     11/22/2015    . CAD (coronary artery disease)     a. s/p CABG approx 1997; b. patient of Dr. Wynonia Lawman, MD  . Dementia     Patient Active Problem List   Diagnosis Date Noted  . GI bleed 03/19/2016  . CAD (coronary artery disease) 03/19/2016  . Pressure ulcer 12/18/2015  . Urinary retention   . Paroxysmal atrial fibrillation (HCC)   . Typical atrial flutter ([place])   . NSTEMI (non-ST elevated myocardial infarction) ([place]) 12/14/2015  . Atrial fibrillation with rapid ventricular response ([place])   . S/P TURP   . Hematuria   . S/P CABG (coronary artery bypass graft)   . Hypotension   . BPH (benign prostatic hypertrophy) with urinary retention 12/12/2015  . UTI (urinary tract infection) 11/23/2015  . UTI (lower urinary tract infection) 11/22/2015  . Prostate cancer ([place]) 11/22/2015  . Hypothyroidism 11/22/2015  . Atrial fibrillation ([place]) 11/22/2015  . Dementia 11/22/2015  . Fall 11/22/2015  . Bronchitis 11/22/2015    Past Surgical History  Procedure Laterality Date  . Coronary artery bypass graft    . Transurethral resection of prostate N/A  12/12/2015    Procedure: TRANSURETHRAL RESECTION OF THE PROSTATE ;  Surgeon: Kathie Rhodes, MD;  Location: [facility] ORS;  Service: Urology;  Laterality: N/A;    Current Outpatient Rx  Name  Route  Sig  Dispense  Refill  . acetaminophen (TYLENOL) 500 MG tablet   Oral   Take 500 mg by mouth every 4 (four) hours as needed.         Marland Kitchen albuterol (PROVENTIL HFA;VENTOLIN HFA) 108 (90 Base) MCG/ACT inhaler   Inhalation   Inhale 2 puffs into the lungs every 6 (six) hours as needed for wheezing or shortness of breath.   1 Inhaler   0   . alum & mag hydroxide-simeth (MINTOX) [id number] MG/5ML suspension   Oral   Take 30 mLs by mouth as needed for indigestion or heartburn.         . Amino Acids-Protein Hydrolys (FEEDING SUPPLEMENT, PRO-STAT SUGAR FREE 64,) LIQD   Oral   Take 30 mLs by mouth 2 (two) times daily.         Marland Kitchen amiodarone (PACERONE) 100 MG tablet   Oral   Take 1 tablet (100 mg total) by mouth daily.   30 tablet   6   . bicalutamide (CASODEX) 50 MG tablet   Oral   Take 50 mg by mouth daily. Reported on 12/13/2015         . divalproex (DEPAKOTE SPRINKLE) 125 MG  capsule   Oral   Take 250 mg by mouth 2 (two) times daily.         Marland Kitchen donepezil (ARICEPT) 5 MG tablet   Oral   Take 5 mg by mouth at bedtime.         Marland Kitchen escitalopram (LEXAPRO) 5 MG tablet   Oral   Take 5 mg by mouth daily.         . ferrous sulfate 325 (65 FE) MG tablet   Oral   Take 1 tablet (325 mg total) by mouth 2 (two) times daily with a meal.   180 tablet   0   . fexofenadine (ALLEGRA) 180 MG tablet   Oral   Take 180 mg by mouth daily.         . finasteride (PROSCAR) 5 MG tablet   Oral   Take 5 mg by mouth daily.         . furosemide (LASIX) 20 MG tablet   Oral   Take 1 tablet (20 mg total) by mouth daily.   30 tablet   0   . guaifenesin (ROBITUSSIN) 100 MG/5ML syrup   Oral   Take 200 mg by mouth every 6 (six) hours as needed for cough.         . hydrocortisone 2.5 % cream    Topical   Apply topically 2 (two) times daily as needed.         Marland Kitchen levothyroxine (SYNTHROID, LEVOTHROID) 50 MCG tablet   Oral   Take 50 mcg by mouth daily before breakfast.         . lisinopril (PRINIVIL,ZESTRIL) 2.5 MG tablet   Oral   Take 1 tablet (2.5 mg total) by mouth daily.   30 tablet   0   . loperamide (IMODIUM) 2 MG capsule   Oral   Take 2 mg by mouth as needed for diarrhea or loose stools.         Marland Kitchen LORazepam (ATIVAN) 0.5 MG tablet   Oral   Take 0.25 mg by mouth 2 (two) times daily.         . magnesium hydroxide (MILK OF MAGNESIA) 400 MG/5ML suspension   Oral   Take 30 mLs by mouth at bedtime as needed for mild constipation.         . Melatonin 3 MG TABS   Oral   Take 3 mg by mouth at bedtime.         . miconazole (MICOTIN) 2 % cream   Topical   Apply 1 application topically 3 (three) times daily as needed.         . Multiple Vitamin (MULTIVITAMIN WITH MINERALS) TABS tablet   Oral   Take 1 tablet by mouth daily.         Marland Kitchen neomycin-bacitracin-polymyxin (NEOSPORIN) ointment   Topical   Apply 1 application topically as needed for wound care. apply to eye         . pantoprazole (PROTONIX) 40 MG tablet   Oral   Take 1 tablet (40 mg total) by mouth daily.   30 tablet   0   . rosuvastatin (CRESTOR) 5 MG tablet   Oral   Take 1 tablet (5 mg total) by mouth daily at 6 PM. Patient taking differently: Take 5 mg by mouth daily.    30 tablet   0   . Skin Protectants, Misc. (MINERIN) CREA   Apply externally   Apply topically 2 (two) times daily.         Marland Kitchen  tamsulosin (FLOMAX) 0.4 MG CAPS capsule   Oral   Take 1 capsule (0.4 mg total) by mouth daily after supper.   30 capsule   0   . triamcinolone cream (KENALOG) 0.5 %   Topical   Apply 1 application topically daily.           Allergies Review of patient's allergies indicates no known allergies.  Family History  Problem Relation Age of Onset  . Cancer Brother   . Heart disease  Father     Social History Social History  Substance Use Topics  . Smoking status: Former Research scientist (life sciences)  . Smokeless tobacco: None  . Alcohol Use: No    Review of Systems Level 5 chart caveat; no further history available due to patient status.  ____________________________________________   PHYSICAL EXAM:  VITAL SIGNS: ED Triage Vitals  Enc Vitals Group     BP 04/01/16 1937 142/71 mmHg     Pulse Rate 04/01/16 1937 62     Resp --      Temp 04/01/16 1937 98.1 F (36.7 C)     Temp Source 04/01/16 1937 Oral     SpO2 04/01/16 1937 96 %     Weight 04/01/16 1937 150 lb (68.04 kg)     Height 04/01/16 1937 5\' 7"  (1.702 m)     Head Cir --      Peak Flow --      Pain Score --      Pain Loc --      Pain Edu? --      Excl. in [place]? --     Constitutional: Alert and oriented. Well appearing and in no acute distress. Eyes: Conjunctivae are normal. PERRL. EOMI. Head: Atraumatic. Nose: No congestion/rhinnorhea. Mouth/Throat: Mucous membranes are moist.  Oropharynx non-erythematous. Neck: No stridor.   Nontender with no meningismus Cardiovascular: Normal rate, regular rhythm. Grossly normal heart sounds.  Good peripheral circulation. Respiratory: Normal respiratory effort.  No retractions. Lungs CTAB. Abdominal: Soft and nontender. No distention. No guarding no rebound Back:  There is no focal tenderness or step off there is no midline tenderness there are no lesions noted. there is no CVA tenderness Musculoskeletal: No lower extremity tenderness. No joint effusions, no DVT signs strong distal pulses no edema Neurologic:  Normal speech and language. No gross focal neurologic deficits are appreciated.  Skin:  Skin is warm, dry and intact. Patient does have some skin breakdown in his rectal rhythm with not a significant decubitus ulcer no evidence of cellulitis Psychiatric: Mood and affect are normal. Speech and behavior are normal.  ____________________________________________   LABS (all  labs ordered are listed, but only abnormal results are displayed)  Labs Reviewed - No data to display ____________________________________________  EKG  I personally interpreted any EKGs ordered by me or triage  ____________________________________________  RADIOLOGY  I reviewed any imaging ordered by me or triage that were performed during my shift and, if possible, patient and/or family made aware of any abnormal findings. ____________________________________________   PROCEDURES  Procedure(s) performed: None  Critical Care performed: None  ____________________________________________   INITIAL IMPRESSION / ASSESSMENT AND PLAN / ED COURSE  Pertinent labs & imaging results that were available during my care of the patient were reviewed by me and considered in my medical decision making (see chart for details).  Patient with what is described as a likely non-syncopal fall at his baseline with no evidence of significant injury CT is negative we will discharge. Family very comfortable with this plan.  ____________________________________________   FINAL CLINICAL IMPRESSION(S) / ED DIAGNOSES  Final diagnoses:  Closed head injury, initial encounter      This chart was dictated using voice recognition software.  Despite best efforts to proofread,  errors can occur which can change meaning.     Schuyler Amor, MD 04/01/16 [phone]

## 2016-04-01 NOTE — ED Notes (Signed)
[facility] reports that pt came from [facility] due to unwitnessed fall. Per EMS pt is wheelchair bound and was visiting another pt in their room when staff reports that he was "probably trying to transfer himself from the recliner to his wheelchair" when they heard him fall. Staff reported to EMS that there was no LOC. Pt has hx of dementia but is appropriately answering at baseline. Pt did not lose his hat and fall and complaints of head pain or redness or swelling noted to head. Pt states that nothing hurts and he does not remember falling. Pt is in NAD at this time.

## 2016-04-01 NOTE — ED Notes (Signed)
Pt cleaned from BM upon arrival to ER. [facility] kindly helped this RN with this process. Upon assessment, pt buttocks bilaterally as well as scrotal area covered in red diaper rash. Family on site and asked to see which this RN complied with this request. Barrier ointment applied to all of reddened area. Area is non blanchable at this time and does not appear ulcerated. This RN will let [place] know about discovery and treatment so that they can follow up with treatment of rash.

## 2016-04-11 ENCOUNTER — Emergency Department: Payer: Medicare Other

## 2016-04-11 ENCOUNTER — Emergency Department
Admission: EM | Admit: 2016-04-11 | Discharge: 2016-04-11 | Disposition: A | Discharge status: Home / Self Care | Payer: Medicare Other | Attending: Emergency Medicine | Admitting: Emergency Medicine

## 2016-04-11 ENCOUNTER — Encounter: Payer: Self-pay | Admitting: Intensive Care

## 2016-04-11 ENCOUNTER — Encounter: Payer: Self-pay | Admitting: Emergency Medicine

## 2016-04-11 DIAGNOSIS — Z79899 Other long term (current) drug therapy: Secondary | ICD-10-CM | POA: Insufficient documentation

## 2016-04-11 DIAGNOSIS — M25561 Pain in right knee: Secondary | ICD-10-CM | POA: Diagnosis not present

## 2016-04-11 DIAGNOSIS — Z951 Presence of aortocoronary bypass graft: Secondary | ICD-10-CM | POA: Insufficient documentation

## 2016-04-11 DIAGNOSIS — Z87891 Personal history of nicotine dependence: Secondary | ICD-10-CM | POA: Diagnosis not present

## 2016-04-11 DIAGNOSIS — I2510 Atherosclerotic heart disease of native coronary artery without angina pectoris: Secondary | ICD-10-CM | POA: Insufficient documentation

## 2016-04-11 DIAGNOSIS — E039 Hypothyroidism, unspecified: Secondary | ICD-10-CM | POA: Insufficient documentation

## 2016-04-11 DIAGNOSIS — Z048 Encounter for examination and observation for other specified reasons: Secondary | ICD-10-CM | POA: Diagnosis present

## 2016-04-11 DIAGNOSIS — Y9339 Activity, other involving climbing, rappelling and jumping off: Secondary | ICD-10-CM | POA: Insufficient documentation

## 2016-04-11 DIAGNOSIS — Y929 Unspecified place or not applicable: Secondary | ICD-10-CM | POA: Diagnosis not present

## 2016-04-11 DIAGNOSIS — Y999 Unspecified external cause status: Secondary | ICD-10-CM | POA: Insufficient documentation

## 2016-04-11 DIAGNOSIS — I4891 Unspecified atrial fibrillation: Secondary | ICD-10-CM | POA: Diagnosis not present

## 2016-04-11 DIAGNOSIS — F0390 Unspecified dementia without behavioral disturbance: Secondary | ICD-10-CM | POA: Diagnosis not present

## 2016-04-11 DIAGNOSIS — Z8546 Personal history of malignant neoplasm of prostate: Secondary | ICD-10-CM | POA: Diagnosis not present

## 2016-04-11 DIAGNOSIS — W19XXXA Unspecified fall, initial encounter: Secondary | ICD-10-CM

## 2016-04-11 DIAGNOSIS — E785 Hyperlipidemia, unspecified: Secondary | ICD-10-CM | POA: Insufficient documentation

## 2016-04-11 DIAGNOSIS — W050XXA Fall from non-moving wheelchair, initial encounter: Secondary | ICD-10-CM | POA: Diagnosis not present

## 2016-04-11 DIAGNOSIS — Y939 Activity, unspecified: Secondary | ICD-10-CM | POA: Diagnosis not present

## 2016-04-11 DIAGNOSIS — Y9289 Other specified places as the place of occurrence of the external cause: Secondary | ICD-10-CM | POA: Diagnosis not present

## 2016-04-11 LAB — BASIC METABOLIC PANEL
Anion gap: 5 (ref 5–15)
BUN: 17 mg/dL (ref 6–20)
CALCIUM: 8.7 mg/dL — AB (ref 8.9–10.3)
CO2: 28 mmol/L (ref 22–32)
CREATININE: 1.12 mg/dL (ref 0.61–1.24)
Chloride: 107 mmol/L (ref 101–111)
GFR calc Af Amer: >60 mL/min (ref >60)
GFR, EST NON AFRICAN AMERICAN: 56 mL/min — AB (ref >60)
GLUCOSE: 96 mg/dL (ref 65–99)
Potassium: 3.5 mmol/L (ref 3.5–5.1)
Sodium: 140 mmol/L (ref 135–145)

## 2016-04-11 LAB — CBC WITH DIFFERENTIAL/PLATELET
BASOS ABS: 0.1 10*3/uL (ref 0–0.1)
Basophils Relative: 1 %
EOS PCT: 6 %
Eosinophils Absolute: 0.3 10*3/uL (ref 0–0.7)
HEMATOCRIT: 27.8 % — AB (ref 40.0–52.0)
Hemoglobin: 9 g/dL — ABNORMAL LOW (ref 13.0–18.0)
LYMPHS PCT: 25 %
Lymphs Abs: 1.1 10*3/uL (ref 1.0–3.6)
MCH: 30.4 pg (ref 26.0–34.0)
MCHC: 32.5 g/dL (ref 32.0–36.0)
MCV: 93.4 fL (ref 80.0–100.0)
MONO ABS: 0.4 10*3/uL (ref 0.2–1.0)
MONOS PCT: 9 %
NEUTROS ABS: 2.7 10*3/uL (ref 1.4–6.5)
Neutrophils Relative %: 59 %
PLATELETS: 184 10*3/uL (ref 150–440)
RBC: 2.97 MIL/uL — ABNORMAL LOW (ref 4.40–5.90)
RDW: 16.6 % — AB (ref 11.5–14.5)
WBC: 4.5 10*3/uL (ref 3.8–10.6)

## 2016-04-11 LAB — URINALYSIS COMPLETE WITH MICROSCOPIC (ARMC ONLY)
BACTERIA UA: NONE SEEN
BILIRUBIN URINE: NEGATIVE
GLUCOSE, UA: NEGATIVE mg/dL
Hgb urine dipstick: NEGATIVE
KETONES UR: NEGATIVE mg/dL
LEUKOCYTES UA: NEGATIVE
Nitrite: NEGATIVE
PH: 7 (ref 5.0–8.0)
Protein, ur: NEGATIVE mg/dL
SQUAMOUS EPITHELIAL / LPF: NONE SEEN
Specific Gravity, Urine: 1.016 (ref 1.005–1.030)

## 2016-04-11 NOTE — ED Provider Notes (Signed)
[facility] Emergency Department Provider Note  ____________________________________________  Time seen: Approximately 10:24 AM  I have reviewed the triage vital signs and the nursing notes.   HISTORY  Chief Complaint Fall   HPI Level 5 caveat:  Portions of the history and physical were unable to be obtained due to dementia   Adam Nielsen is a 82 y.o. male history of dementia, BPH, CAD, paroxysmal atrial fibrillation on Eliquis who was seen this morning here for a fall and returns from [facility] for another unwitnessed fall. Patient is complaining of pain on his right knee. He denies headache, neck pain, back pain, chest pain, abdominal pain, pain on any of his 4 extremities other than his right knee. Patient does not remember falling.  Spoke to [employer] at [facility]. Patient was in his wheelchair inside another resident's room trying to climb onto a bed from the wheelchair when he fell on the ground. The fall was unwitnessed and he was found after he started screaming.   Past Medical History:  Diagnosis Date  . Acute bronchitis 11/22/2015   . BPH (benign prostatic hypertrophy)   . CAD (coronary artery disease)    a. s/p CABG approx 1997; b. patient of Dr. Wynonia Lawman, MD  . Chronic indwelling Foley catheter   . Dementia   . Dementia   . Fall    11/22/2015    . Hyperlipidemia   . Hypothyroid   . PAF (paroxysmal atrial fibrillation) (HCC)    a. not on long term full dose anticoagulation  . Prostate CA ([place])   . Urinary tract infection 11/22/2015     Patient Active Problem List   Diagnosis Date Noted  . GI bleed 03/19/2016  . CAD (coronary artery disease) 03/19/2016  . Pressure ulcer 12/18/2015  . Urinary retention   . Paroxysmal atrial fibrillation (HCC)   . Typical atrial flutter ([place])   . NSTEMI (non-ST elevated myocardial infarction) ([place]) 12/14/2015  . Atrial fibrillation with rapid ventricular response ([place])   . S/P TURP   .  Hematuria   . S/P CABG (coronary artery bypass graft)   . Hypotension   . BPH (benign prostatic hypertrophy) with urinary retention 12/12/2015  . UTI (urinary tract infection) 11/23/2015  . UTI (lower urinary tract infection) 11/22/2015  . Prostate cancer ([place]) 11/22/2015  . Hypothyroidism 11/22/2015  . Atrial fibrillation ([place]) 11/22/2015  . Dementia 11/22/2015  . Fall 11/22/2015  . Bronchitis 11/22/2015    Past Surgical History:  Procedure Laterality Date  . CORONARY ARTERY BYPASS GRAFT    . TRANSURETHRAL RESECTION OF PROSTATE N/A 12/12/2015   Procedure: TRANSURETHRAL RESECTION OF THE PROSTATE ;  Surgeon: Kathie Rhodes, MD;  Location: [facility];  Service: Urology;  Laterality: N/A;    Current Outpatient Rx  . Order #: [id number] Class: Historical Med  . Order #: [id number] Class: Print  . Order #: [id number] Class: Historical Med  . Order #: [id number] Class: Historical Med  . Order #: [id number] Class: Normal  . Order #: [id number] Class: Historical Med  . Order #: [id number] Class: Historical Med  . Order #: [id number] Class: Historical Med  . Order #: [id number] Class: Historical Med  . Order #: [id number] Class: Normal  . Order #: [id number] Class: Historical Med  . Order #: [id number] Class: Historical Med  . Order #: [id number] Class: Normal  . Order #: [id number] Class: Historical Med  . Order #: [id number] Class: Historical Med  . Order #: [id number] Class: Historical Med  . Order #: [id number] Class: Print  . Order #: [id number] Class: Historical Med  .  Order #: [id number] Class: Historical Med  . Order #: [id number] Class: Historical Med  . Order #: [id number] Class: Historical Med  . Order #: [id number] Class: Historical Med  . Order #: [id number] Class: Historical Med  . Order #: [id number] Class: Historical Med  . Order #: [id number] Class: Normal  . Order #: [id number] Class: Print  . Order #: [id number] Class: Historical Med  . Order #: [id number] Class: Normal  . Order #: [id number] Class: Historical Med     Allergies Review of patient's allergies indicates no known allergies.  Family History  Problem Relation Age of Onset  . Heart disease Father   . Cancer Brother     Social History Social History  Substance Use Topics  . Smoking status: Former Research scientist (life sciences)  . Smokeless tobacco: Never Used  . Alcohol use No    Review of Systems  Constitutional: Negative for fever. + fall Eyes: Negative for visual changes. ENT: Negative for sore throat. Cardiovascular: Negative for chest pain. Respiratory: Negative for shortness of breath. Gastrointestinal: Negative for abdominal pain, vomiting or diarrhea. Genitourinary: Negative for dysuria. Musculoskeletal: Negative for back pain. + right knee pain Skin: Negative for rash. Neurological: Negative for headaches, weakness or numbness.  ____________________________________________   PHYSICAL EXAM:  VITAL SIGNS: ED Triage Vitals [04/11/16 0948]  Enc Vitals Group     BP 105/71     Pulse Rate 85     Resp 19     Temp 97.9 F (36.6 C)     Temp Source Oral     SpO2 96 %     Weight 160 lb 0.9 oz (72.6 kg)     Height      Head Circumference      Peak Flow      Pain Score      Pain Loc      Pain Edu?      Excl. in [place]?    Constitutional: Alert and oriented x 1 which is baseline. No acute distress. Does not appear intoxicated. HEENT Head: Normocephalic and atraumatic. Face: No facial bony tenderness. Stable midface Ears: No hemotympanum bilaterally. Eyes: No eye injury. PERRL. Nose: Nontender. No epistaxis. Mouth/Throat: Mucous membranes are moist. No oropharyngeal blood. No dental injury. Airway patent without stridor. Normal voice. Neck: no C-collar in place. No midline c-spine tenderness.  Cardiovascular: Normal rate, regular rhythm. Normal and symmetric distal pulses are present in all extremities. Pulmonary/Chest: Chest wall is stable and nontender to palpation/compression. Normal respiratory effort. Breath sounds are normal. No  crepitus.  Abdominal: Soft, nontender, non distended. Musculoskeletal: Nontender with normal full range of motion in all extremities. No deformities. No thoracic or lumbar midline spinal tenderness. Pelvis is stable. Skin: Skin is warm, dry and intact. No abrasions or contutions. Neurological: Normal speech and language. Moves all extremities to command. No gross focal neurologic deficits are appreciated.  Glascow Coma Score: 4 - Opens eyes on own 6 - Follows simple motor commands 5 - Alert and oriented GCS: 15  ____________________________________________   LABS (all labs ordered are listed, but only abnormal results are displayed)  Labs Reviewed - No data to display ____________________________________________  EKG  ED ECG REPORT I, Rudene Re, the attending physician, personally viewed and interpreted this ECG.  Atrial fibrillation, rate of 63, prolonged QTC, normal axis, no ST elevations or depressions.  ____________________________________________  RADIOLOGY  Head CT: negative C-spine CT: Negative XR R knee: negative ____________________________________________   PROCEDURES  Procedure(s) performed: None Procedures Critical Care performed:  None ____________________________________________   INITIAL IMPRESSION / ASSESSMENT AND PLAN /  ED COURSE  82 y.o. male history of dementia, BPH, CAD, paroxysmal atrial fibrillation on Eliquis who was seen this morning here for a fall and returns from [facility] for another unwitnessed fall. Patient fell off his wheelchair while trying to climb onto a bed. He had labs done at 1 AM this morning which showed stable hemoglobin, stable kidney function, negative UA. Since patient is on Eliquis will get a head CT and CT cervical spine. He is complaining of pain in his right knee however the knee seems atraumatic with full painless range of motion. We'll get an x-ray. We'll not repeat labs at this time as they have been done  less than 12 hours ago. His EKG is nonischemic.  Clinical Course  Comment By Time  Imaging negative. Patient remains at his baseline. We'll discharge back to the facility. Rudene Re, MD 07/26 1146    Pertinent labs & imaging results that were available during my care of the patient were reviewed by me and considered in my medical decision making (see chart for details).    ____________________________________________   FINAL CLINICAL IMPRESSION(S) / ED DIAGNOSES  Final diagnoses:  Fall, initial encounter  Right knee pain      NEW MEDICATIONS STARTED DURING THIS VISIT:  New Prescriptions   No medications on file     Note:  This document was prepared using Dragon voice recognition software and may include unintentional dictation errors.    Rudene Re, MD 04/11/16 1148

## 2016-04-11 NOTE — ED Notes (Signed)
Lennix Kneisel RN called [facility] to give report and see if they would come to pickup the Pt. They stated that the Pt should go back via EMS. Mallie Mussel RN spoke to [place].

## 2016-04-11 NOTE — ED Notes (Signed)
Pt went to CT

## 2016-04-11 NOTE — ED Provider Notes (Signed)
[facility] Emergency Department Provider Note  ____________________________________________  Time seen: Approximately 2:29 AM  I have reviewed the triage vital signs and the nursing notes.   HISTORY  Chief Complaint Fall  Level 5 caveat:  Portions of the history and physical were unable to be obtained due to the patient's dementia   HPI Adam Nielsen is a 82 y.o. male sent to the ED due to an unwitnessed fall. No obvious trauma. Patient denies pain. No specific complaints.Patient is on Eliquis.     Past Medical History:  Diagnosis Date  . Acute bronchitis 11/22/2015   . BPH (benign prostatic hypertrophy)   . CAD (coronary artery disease)    a. s/p CABG approx 1997; b. patient of Dr. Wynonia Lawman, MD  . Chronic indwelling Foley catheter   . Dementia   . Dementia   . Fall    11/22/2015    . Hyperlipidemia   . Hypothyroid   . PAF (paroxysmal atrial fibrillation) (HCC)    a. not on long term full dose anticoagulation  . Prostate CA ([place])   . Urinary tract infection 11/22/2015      Patient Active Problem List   Diagnosis Date Noted  . GI bleed 03/19/2016  . CAD (coronary artery disease) 03/19/2016  . Pressure ulcer 12/18/2015  . Urinary retention   . Paroxysmal atrial fibrillation (HCC)   . Typical atrial flutter ([place])   . NSTEMI (non-ST elevated myocardial infarction) ([place]) 12/14/2015  . Atrial fibrillation with rapid ventricular response ([place])   . S/P TURP   . Hematuria   . S/P CABG (coronary artery bypass graft)   . Hypotension   . BPH (benign prostatic hypertrophy) with urinary retention 12/12/2015  . UTI (urinary tract infection) 11/23/2015  . UTI (lower urinary tract infection) 11/22/2015  . Prostate cancer ([place]) 11/22/2015  . Hypothyroidism 11/22/2015  . Atrial fibrillation ([place]) 11/22/2015  . Dementia 11/22/2015  . Fall 11/22/2015  . Bronchitis 11/22/2015     Past Surgical History:  Procedure Laterality Date  . CORONARY ARTERY  BYPASS GRAFT    . TRANSURETHRAL RESECTION OF PROSTATE N/A 12/12/2015   Procedure: TRANSURETHRAL RESECTION OF THE PROSTATE ;  Surgeon: Kathie Rhodes, MD;  Location: [facility];  Service: Urology;  Laterality: N/A;     Prior to Admission medications   Medication Sig Start Date End Date Taking? Authorizing Provider  acetaminophen (TYLENOL) 500 MG tablet Take 500 mg by mouth every 4 (four) hours as needed.    Historical Provider, MD  albuterol (PROVENTIL HFA;VENTOLIN HFA) 108 (90 Base) MCG/ACT inhaler Inhale 2 puffs into the lungs every 6 (six) hours as needed for wheezing or shortness of breath. 11/24/15   Loletha Grayer, MD  alum & mag hydroxide-simeth ([place]) 200-200-20 MG/5ML suspension Take 30 mLs by mouth as needed for indigestion or heartburn.    Historical Provider, MD  Amino Acids-Protein Hydrolys (FEEDING SUPPLEMENT, PRO-STAT SUGAR FREE 64,) LIQD Take 30 mLs by mouth 2 (two) times daily.    Historical Provider, MD  amiodarone (PACERONE) 100 MG tablet Take 1 tablet (100 mg total) by mouth daily. 02/08/16   Minna Merritts, MD  bicalutamide (CASODEX) 50 MG tablet Take 50 mg by mouth daily. Reported on 12/13/2015    Historical Provider, MD  divalproex (DEPAKOTE SPRINKLE) 125 MG capsule Take 250 mg by mouth 2 (two) times daily.    Historical Provider, MD  donepezil (ARICEPT) 5 MG tablet Take 5 mg by mouth at bedtime.    Historical Provider,  MD  escitalopram (LEXAPRO) 5 MG tablet Take 5 mg by mouth daily.    Historical Provider, MD  ferrous sulfate 325 (65 FE) MG tablet Take 1 tablet (325 mg total) by mouth 2 (two) times daily with a meal. 03/22/16   Srikar Sudini, MD  fexofenadine (ALLEGRA) 180 MG tablet Take 180 mg by mouth daily.    Historical Provider, MD  finasteride (PROSCAR) 5 MG tablet Take 5 mg by mouth daily.    Historical Provider, MD  furosemide (LASIX) 20 MG tablet Take 1 tablet (20 mg total) by mouth daily. 12/22/15   Vaughan Basta, MD  guaifenesin (ROBITUSSIN) 100 MG/5ML syrup Take  200 mg by mouth every 6 (six) hours as needed for cough.    Historical Provider, MD  hydrocortisone 2.5 % cream Apply topically 2 (two) times daily as needed.    Historical Provider, MD  levothyroxine (SYNTHROID, LEVOTHROID) 50 MCG tablet Take 50 mcg by mouth daily before breakfast.    Historical Provider, MD  lisinopril (PRINIVIL,ZESTRIL) 2.5 MG tablet Take 1 tablet (2.5 mg total) by mouth daily. 12/21/15   Vaughan Basta, MD  loperamide (IMODIUM) 2 MG capsule Take 2 mg by mouth as needed for diarrhea or loose stools.    Historical Provider, MD  LORazepam (ATIVAN) 0.5 MG tablet Take 0.25 mg by mouth 2 (two) times daily.    Historical Provider, MD  magnesium hydroxide (MILK OF MAGNESIA) 400 MG/5ML suspension Take 30 mLs by mouth at bedtime as needed for mild constipation.    Historical Provider, MD  Melatonin 3 MG TABS Take 3 mg by mouth at bedtime.    Historical Provider, MD  miconazole (MICOTIN) 2 % cream Apply 1 application topically 3 (three) times daily as needed.    Historical Provider, MD  Multiple Vitamin (MULTIVITAMIN WITH MINERALS) TABS tablet Take 1 tablet by mouth daily.    Historical Provider, MD  neomycin-bacitracin-polymyxin (NEOSPORIN) ointment Apply 1 application topically as needed for wound care. apply to eye    Historical Provider, MD  pantoprazole (PROTONIX) 40 MG tablet Take 1 tablet (40 mg total) by mouth daily. 03/22/16   Srikar Sudini, MD  rosuvastatin (CRESTOR) 5 MG tablet Take 1 tablet (5 mg total) by mouth daily at 6 PM. Patient taking differently: Take 5 mg by mouth daily.  12/21/15   Vaughan Basta, MD  Skin Protectants, Misc. (MINERIN) CREA Apply topically 2 (two) times daily.    Historical Provider, MD  tamsulosin (FLOMAX) 0.4 MG CAPS capsule Take 1 capsule (0.4 mg total) by mouth daily after supper. 12/21/15   Vaughan Basta, MD  triamcinolone cream (KENALOG) 0.5 % Apply 1 application topically daily.    Historical Provider, MD     Allergies Review  of patient's allergies indicates no known allergies.   Family History  Problem Relation Age of Onset  . Heart disease Father   . Cancer Brother     Social History Social History  Substance Use Topics  . Smoking status: Former Research scientist (life sciences)  . Smokeless tobacco: Never Used  . Alcohol use No    Review of Systems  Constitutional:   No fever or chills.   Cardiovascular:   No chest pain. Respiratory:   No dyspnea or cough. Gastrointestinal:   Negative for abdominal pain, vomiting and diarrhea.  Genitourinary:  Chronic Foley. Musculoskeletal:   Negative for focal pain or swelling Neurological:   Negative for headaches 10-point ROS otherwise negative.  ____________________________________________   PHYSICAL EXAM:  VITAL SIGNS: ED Triage Vitals  Enc  Vitals Group     BP 04/11/16 0046 121/73     Pulse Rate 04/11/16 0046 72     Resp 04/11/16 0046 16     Temp 04/11/16 0046 97.6 F (36.4 C)     Temp Source 04/11/16 0046 Oral     SpO2 04/11/16 0041 98 %     Weight 04/11/16 0046 168 lb (76.2 kg)     Height 04/11/16 0046 5\' 5"  (1.651 m)     Head Circumference --      Peak Flow --      Pain Score 04/11/16 0048 0     Pain Loc --      Pain Edu? --      Excl. in [place]? --     Vital signs reviewed, nursing assessments reviewed.   Constitutional:   Alert and orientedTo self. Well appearing and in no distress. Eyes:   No scleral icterus. No conjunctival pallor. PERRL. EOMI.  No nystagmus. ENT   Head:   Normocephalic and atraumatic. TMs normal   Nose:   No congestion/rhinnorhea. No septal hematoma   Mouth/Throat:   MMM, no pharyngeal erythema. No peritonsillar mass.    Neck:   No stridor. No SubQ emphysema. No meningismus. Hematological/Lymphatic/Immunilogical:   No cervical lymphadenopathy. Cardiovascular:   RRR. Symmetric bilateral radial and DP pulses.  No murmurs.  Respiratory:   Normal respiratory effort without tachypnea nor retractions. Breath sounds are clear and  equal bilaterally. No wheezes/rales/rhonchi. Gastrointestinal:   Soft and nontender. Non distended. There is no CVA tenderness.  No rebound, rigidity, or guarding. Genitourinary:   deferred Musculoskeletal:   Nontender with normal range of motion in all extremities. No joint effusions.  No lower extremity tenderness.  No edema. Neurologic:   Normal speech and language.  CN 2-10 normal. Motor grossly intact. No gross focal neurologic deficits are appreciated.  Skin:    Skin is warm, dry and intact. No rash noted.  No petechiae, purpura, or bullae.  ____________________________________________    LABS (pertinent positives/negatives) (all labs ordered are listed, but only abnormal results are displayed) Labs Reviewed  CBC WITH DIFFERENTIAL/PLATELET - Abnormal; Notable for the following:       Result Value   RBC 2.97 (*)    Hemoglobin 9.0 (*)    HCT 27.8 (*)    RDW 16.6 (*)    All other components within normal limits  BASIC METABOLIC PANEL - Abnormal; Notable for the following:    Calcium 8.7 (*)    GFR calc non Af Amer 56 (*)    All other components within normal limits  URINALYSIS COMPLETEWITH MICROSCOPIC ([facility] ONLY)   ____________________________________________   EKG  Interpreted by me Sinus rhythm rate of 69, normal axis intervals QRS ST segments and T waves  ____________________________________________    RADIOLOGY  CT head unremarkable  ____________________________________________   PROCEDURES Procedures  ____________________________________________   INITIAL IMPRESSION / ASSESSMENT AND PLAN / ED COURSE  Pertinent labs & imaging results that were available during my care of the patient were reviewed by me and considered in my medical decision making (see chart for details).  Patient well appearing no acute distress. Unable to provide any specific history but no specific complaints. Sent for unwitnessed fall in the setting of anticoagulant use. Labs and  CT head unremarkable. Patient's medically stable for follow-up with primary care doctor and an outpatient basis. Low suspicion for any acute precipitating event such as UTI pneumonia or sepsis, meningitis encephalitis stroke ACS PE dissection or  AAA.     Clinical Course   ____________________________________________   FINAL CLINICAL IMPRESSION(S) / ED DIAGNOSES  Final diagnoses:  Fall, initial encounter  Dementia, without behavioral disturbance       Portions of this note were generated with dragon dictation software. Dictation errors may occur despite best attempts at proofreading.    Carrie Mew, MD 04/11/16 [phone]

## 2016-04-11 NOTE — ED Triage Notes (Signed)
Pt arrived to the ED via [facility] from [facility] for an unwitnessed fall. Pt is Alert and cooperative  not complaining of pain or injury. Pt is a dementia Pt. Pt is in no apparent distress, ED MD at bedside.

## 2016-04-11 NOTE — ED Notes (Signed)
Pt's son called to check on the Pt. Pt's son is: Zola Capponi [phone].

## 2016-04-11 NOTE — ED Triage Notes (Signed)
Patient arrived by EMS from [facility] house. Staff reports they found patient in floor. PAtient usually independently walks around [employer]. Pt has HX dementia. A&O to self only. EMS vitals 112/58 b/p, 97 RA

## 2016-04-15 ENCOUNTER — Emergency Department
Admission: EM | Admit: 2016-04-15 | Discharge: 2016-04-15 | Disposition: A | Discharge status: Home / Self Care | Payer: Medicare Other | Attending: Emergency Medicine | Admitting: Emergency Medicine

## 2016-04-15 DIAGNOSIS — Z048 Encounter for examination and observation for other specified reasons: Secondary | ICD-10-CM | POA: Diagnosis present

## 2016-04-15 DIAGNOSIS — E039 Hypothyroidism, unspecified: Secondary | ICD-10-CM | POA: Diagnosis not present

## 2016-04-15 DIAGNOSIS — W19XXXD Unspecified fall, subsequent encounter: Secondary | ICD-10-CM | POA: Insufficient documentation

## 2016-04-15 DIAGNOSIS — F0390 Unspecified dementia without behavioral disturbance: Secondary | ICD-10-CM | POA: Insufficient documentation

## 2016-04-15 DIAGNOSIS — Y929 Unspecified place or not applicable: Secondary | ICD-10-CM | POA: Diagnosis not present

## 2016-04-15 DIAGNOSIS — Z8546 Personal history of malignant neoplasm of prostate: Secondary | ICD-10-CM | POA: Insufficient documentation

## 2016-04-15 DIAGNOSIS — E785 Hyperlipidemia, unspecified: Secondary | ICD-10-CM | POA: Diagnosis not present

## 2016-04-15 DIAGNOSIS — Z951 Presence of aortocoronary bypass graft: Secondary | ICD-10-CM | POA: Diagnosis not present

## 2016-04-15 DIAGNOSIS — Y999 Unspecified external cause status: Secondary | ICD-10-CM | POA: Diagnosis not present

## 2016-04-15 DIAGNOSIS — Y939 Activity, unspecified: Secondary | ICD-10-CM | POA: Insufficient documentation

## 2016-04-15 DIAGNOSIS — I2510 Atherosclerotic heart disease of native coronary artery without angina pectoris: Secondary | ICD-10-CM | POA: Insufficient documentation

## 2016-04-15 DIAGNOSIS — Z87891 Personal history of nicotine dependence: Secondary | ICD-10-CM | POA: Diagnosis not present

## 2016-04-15 DIAGNOSIS — Z79899 Other long term (current) drug therapy: Secondary | ICD-10-CM | POA: Diagnosis not present

## 2016-04-15 NOTE — ED Notes (Signed)
Report given to [facility] CNA med tech

## 2016-04-15 NOTE — ED Provider Notes (Signed)
[facility] Emergency Department Provider Note  L5 caveat: Review of systems history is limited by dementia      Time seen: ----------------------------------------- 5:00 PM on 04/15/2016 -----------------------------------------    I have reviewed the triage vital signs and the nursing notes.   HISTORY  Chief Complaint Fall    HPI Adam Nielsen is a 82 y.o. male brought the ER from a nursing home after an unwitnessed fall. He denies any symptoms, reportedly has a history of dementia. Again he has no complaints of pain.   Past Medical History:  Diagnosis Date  . Acute bronchitis 11/22/2015   . BPH (benign prostatic hypertrophy)   . CAD (coronary artery disease)    a. s/p CABG approx 1997; b. patient of Dr. Wynonia Lawman, MD  . Chronic indwelling Foley catheter   . Dementia   . Dementia   . Fall    11/22/2015    . Hyperlipidemia   . Hypothyroid   . PAF (paroxysmal atrial fibrillation) (HCC)    a. not on long term full dose anticoagulation  . Prostate CA ([place])   . Urinary tract infection 11/22/2015     Patient Active Problem List   Diagnosis Date Noted  . GI bleed 03/19/2016  . CAD (coronary artery disease) 03/19/2016  . Pressure ulcer 12/18/2015  . Urinary retention   . Paroxysmal atrial fibrillation (HCC)   . Typical atrial flutter ([place])   . NSTEMI (non-ST elevated myocardial infarction) ([place]) 12/14/2015  . Atrial fibrillation with rapid ventricular response ([place])   . S/P TURP   . Hematuria   . S/P CABG (coronary artery bypass graft)   . Hypotension   . BPH (benign prostatic hypertrophy) with urinary retention 12/12/2015  . UTI (urinary tract infection) 11/23/2015  . UTI (lower urinary tract infection) 11/22/2015  . Prostate cancer ([place]) 11/22/2015  . Hypothyroidism 11/22/2015  . Atrial fibrillation ([place]) 11/22/2015  . Dementia 11/22/2015  . Fall 11/22/2015  . Bronchitis 11/22/2015    Past Surgical History:  Procedure Laterality Date   . CORONARY ARTERY BYPASS GRAFT    . TRANSURETHRAL RESECTION OF PROSTATE N/A 12/12/2015   Procedure: TRANSURETHRAL RESECTION OF THE PROSTATE ;  Surgeon: Kathie Rhodes, MD;  Location: [facility];  Service: Urology;  Laterality: N/A;    Allergies Review of patient's allergies indicates no known allergies.  Social History Social History  Substance Use Topics  . Smoking status: Former Research scientist (life sciences)  . Smokeless tobacco: Never Used  . Alcohol use No    Review of Systems Unknown at this time  ____________________________________________   PHYSICAL EXAM:  VITAL SIGNS: ED Triage Vitals [04/15/16 1654]  Enc Vitals Group     BP 115/64     Pulse Rate (!) 57     Resp 16     Temp 97.6 F (36.4 C)     Temp Source Oral     SpO2 99 %     Weight 160 lb (72.6 kg)     Height 5\' 8"  (1.727 m)     Head Circumference      Peak Flow      Pain Score      Pain Loc      Pain Edu?      Excl. in [place]?     Constitutional: Alert But disoriented. Well appearing and in no distress. Eyes: Conjunctivae are pale. PERRL. Normal extraocular movements. ENT   Head: Normocephalic and atraumatic.   Nose: No congestion/rhinnorhea.   Mouth/Throat: Mucous membranes are moist.  Neck: No stridor. Cardiovascular: Normal rate, regular rhythm. No murmurs, rubs, or gallops. Respiratory: Normal respiratory effort without tachypnea nor retractions. Breath sounds are clear and equal bilaterally. No wheezes/rales/rhonchi. Gastrointestinal: Soft and nontender. Normal bowel sounds Musculoskeletal: Nontender with normal range of motion in all extremities. No lower extremity tenderness nor edema. Neurologic:  Normal speech and language. No gross focal neurologic deficits are appreciated.  Skin:  Skin is warm, dry and intact. No rash noted. ____________________________________________  ED COURSE:  Pertinent labs & imaging results that were available during my care of the patient were reviewed by me and considered in  my medical decision making (see chart for details). Clinical Course  Patients in no acute distress, recently seen 4 days ago for same. He is asymptomatic without any signs of trauma, we will attempt to ambulate.  Procedures  ____________________________________________  FINAL ASSESSMENT AND PLAN  Fall, dementia  Plan: Patient with chronic dementia and a fall today. He appears asymptomatic, can ambulate without difficulty. His labs and CT imaging were reviewed from 4 days ago. He is stable for outpatient follow-up.   Earleen Newport, MD   Note: This dictation was prepared with Dragon dictation. Any transcriptional errors that result from this process are unintentional    Earleen Newport, MD 04/15/16 [phone]

## 2016-04-15 NOTE — ED Triage Notes (Signed)
Pt presents from [place] s/p unwitnessed fall. Denies pain and no pain upon palpation. hx dementia.

## 2016-04-26 ENCOUNTER — Emergency Department
Admission: EM | Admit: 2016-04-26 | Discharge: 2016-04-27 | Disposition: A | Discharge status: Home / Self Care | Payer: Medicare Other | Attending: Emergency Medicine | Admitting: Emergency Medicine

## 2016-04-26 DIAGNOSIS — I2510 Atherosclerotic heart disease of native coronary artery without angina pectoris: Secondary | ICD-10-CM | POA: Diagnosis not present

## 2016-04-26 DIAGNOSIS — Z8546 Personal history of malignant neoplasm of prostate: Secondary | ICD-10-CM | POA: Diagnosis not present

## 2016-04-26 DIAGNOSIS — F0390 Unspecified dementia without behavioral disturbance: Secondary | ICD-10-CM | POA: Diagnosis not present

## 2016-04-26 DIAGNOSIS — Z87891 Personal history of nicotine dependence: Secondary | ICD-10-CM | POA: Diagnosis not present

## 2016-04-26 DIAGNOSIS — W19XXXA Unspecified fall, initial encounter: Secondary | ICD-10-CM | POA: Diagnosis not present

## 2016-04-26 DIAGNOSIS — Z048 Encounter for examination and observation for other specified reasons: Secondary | ICD-10-CM | POA: Insufficient documentation

## 2016-04-26 DIAGNOSIS — I4891 Unspecified atrial fibrillation: Secondary | ICD-10-CM | POA: Insufficient documentation

## 2016-04-26 DIAGNOSIS — Z79899 Other long term (current) drug therapy: Secondary | ICD-10-CM | POA: Insufficient documentation

## 2016-04-27 ENCOUNTER — Emergency Department: Payer: Medicare Other

## 2016-04-27 ENCOUNTER — Encounter: Payer: Self-pay | Admitting: Emergency Medicine

## 2016-04-27 LAB — CBC WITH DIFFERENTIAL/PLATELET
BASOS ABS: 0.1 10*3/uL (ref 0–0.1)
BASOS PCT: 1 %
EOS ABS: 0.2 10*3/uL (ref 0–0.7)
Eosinophils Relative: 4 %
HCT: 31.9 % — ABNORMAL LOW (ref 40.0–52.0)
HEMOGLOBIN: 11.2 g/dL — AB (ref 13.0–18.0)
LYMPHS ABS: 1.3 10*3/uL (ref 1.0–3.6)
Lymphocytes Relative: 26 %
MCH: 32.5 pg (ref 26.0–34.0)
MCHC: 35.2 g/dL (ref 32.0–36.0)
MCV: 92.1 fL (ref 80.0–100.0)
Monocytes Absolute: 0.4 10*3/uL (ref 0.2–1.0)
Monocytes Relative: 7 %
NEUTROS PCT: 62 %
Neutro Abs: 3.2 10*3/uL (ref 1.4–6.5)
PLATELETS: 149 10*3/uL — AB (ref 150–440)
RBC: 3.46 MIL/uL — AB (ref 4.40–5.90)
RDW: 16.2 % — ABNORMAL HIGH (ref 11.5–14.5)
WBC: 5.1 10*3/uL (ref 3.8–10.6)

## 2016-04-27 LAB — BASIC METABOLIC PANEL
ANION GAP: 4 — AB (ref 5–15)
BUN: 24 mg/dL — ABNORMAL HIGH (ref 6–20)
CHLORIDE: 107 mmol/L (ref 101–111)
CO2: 29 mmol/L (ref 22–32)
Calcium: 8.9 mg/dL (ref 8.9–10.3)
Creatinine, Ser: 1.11 mg/dL (ref 0.61–1.24)
GFR, EST NON AFRICAN AMERICAN: 57 mL/min — AB (ref >60)
Glucose, Bld: 116 mg/dL — ABNORMAL HIGH (ref 65–99)
POTASSIUM: 3.2 mmol/L — AB (ref 3.5–5.1)
SODIUM: 140 mmol/L (ref 135–145)

## 2016-04-27 LAB — TROPONIN I

## 2016-04-27 NOTE — Discharge Instructions (Signed)
Return if he develops chest pain, abdominal pain, fever, shortness of breath, back pain, or any other symptoms concerning to you.

## 2016-04-27 NOTE — ED Triage Notes (Signed)
Pt arrived ems from [facility]. Pt had unwitnessed fall and was found on the floor, in no apparent distress. No visible deformities. Hx of dementia.

## 2016-04-27 NOTE — ED Provider Notes (Signed)
[facility] Emergency Department Provider Note  ____________________________________________  Time seen: Approximately 12:12 AM  I have reviewed the triage vital signs and the nursing notes.   HISTORY  Chief Complaint Fall  Level 5 caveat:  Portions of the history and physical were unable to be obtained due to dementia   HPI Adam Nielsen is a 82 y.o. male a history of dementia, BPH, CAD, paroxysmal atrial fibrillation, prostate cancer, and multiple visits to the ED for falls who presents for unwitnessed fall. Patient has no complaints at this time and denies any pain. He has no recollection of the fall. Patient came from [place] after he was found on the floor of his room. Not on blood thinners.  Past Medical History:  Diagnosis Date  . Acute bronchitis 11/22/2015   . BPH (benign prostatic hypertrophy)   . CAD (coronary artery disease)    a. s/p CABG approx 1997; b. patient of Dr. Wynonia Lawman, MD  . Chronic indwelling Foley catheter   . Dementia   . Dementia   . Fall    11/22/2015    . Hyperlipidemia   . Hypothyroid   . PAF (paroxysmal atrial fibrillation) (HCC)    a. not on long term full dose anticoagulation  . Prostate CA ([place])   . Urinary tract infection 11/22/2015     Patient Active Problem List   Diagnosis Date Noted  . GI bleed 03/19/2016  . CAD (coronary artery disease) 03/19/2016  . Pressure ulcer 12/18/2015  . Urinary retention   . Paroxysmal atrial fibrillation (HCC)   . Typical atrial flutter ([place])   . NSTEMI (non-ST elevated myocardial infarction) ([place]) 12/14/2015  . Atrial fibrillation with rapid ventricular response ([place])   . S/P TURP   . Hematuria   . S/P CABG (coronary artery bypass graft)   . Hypotension   . BPH (benign prostatic hypertrophy) with urinary retention 12/12/2015  . UTI (urinary tract infection) 11/23/2015  . UTI (lower urinary tract infection) 11/22/2015  . Prostate cancer ([place]) 11/22/2015  .  Hypothyroidism 11/22/2015  . Atrial fibrillation ([place]) 11/22/2015  . Dementia 11/22/2015  . Fall 11/22/2015  . Bronchitis 11/22/2015    Past Surgical History:  Procedure Laterality Date  . CORONARY ARTERY BYPASS GRAFT    . TRANSURETHRAL RESECTION OF PROSTATE N/A 12/12/2015   Procedure: TRANSURETHRAL RESECTION OF THE PROSTATE ;  Surgeon: Kathie Rhodes, MD;  Location: [facility] ORS;  Service: Urology;  Laterality: N/A;    Prior to Admission medications   Medication Sig Start Date End Date Taking? Authorizing Provider  acetaminophen (TYLENOL) 500 MG tablet Take 500 mg by mouth every 4 (four) hours as needed.   Yes Historical Provider, MD  albuterol (PROVENTIL HFA;VENTOLIN HFA) 108 (90 Base) MCG/ACT inhaler Inhale 2 puffs into the lungs every 6 (six) hours as needed for wheezing or shortness of breath. 11/24/15  Yes Richard Leslye Peer, MD  Amino Acids-Protein Hydrolys (FEEDING SUPPLEMENT, PRO-STAT SUGAR FREE 64,) LIQD Take 30 mLs by mouth 2 (two) times daily.   Yes Historical Provider, MD  amiodarone (PACERONE) 100 MG tablet Take 1 tablet (100 mg total) by mouth daily. Patient taking differently: Take 100 mg by mouth 2 (two) times daily.  02/08/16  Yes Minna Merritts, MD  bicalutamide (CASODEX) 50 MG tablet Take 50 mg by mouth daily. Reported on 12/13/2015   Yes Historical Provider, MD  divalproex (DEPAKOTE SPRINKLE) 125 MG capsule Take 250 mg by mouth 2 (two) times daily.   Yes Historical  Provider, MD  donepezil (ARICEPT) 10 MG tablet Take 10 mg by mouth at bedtime.    Yes Historical Provider, MD  escitalopram (LEXAPRO) 5 MG tablet Take 5 mg by mouth daily.   Yes Historical Provider, MD  ferrous sulfate 325 (65 FE) MG tablet Take 1 tablet (325 mg total) by mouth 2 (two) times daily with a meal. Patient taking differently: Take 325 mg by mouth daily.  03/22/16  Yes Srikar Sudini, MD  fexofenadine (ALLEGRA) 180 MG tablet Take 180 mg by mouth daily.   Yes Historical Provider, MD  finasteride (PROSCAR) 5 MG  tablet Take 5 mg by mouth daily.   Yes Historical Provider, MD  furosemide (LASIX) 20 MG tablet Take 1 tablet (20 mg total) by mouth daily. 12/22/15  Yes Vaughan Basta, MD  guaifenesin (ROBITUSSIN) 100 MG/5ML syrup Take 200 mg by mouth every 6 (six) hours as needed for cough.   Yes Historical Provider, MD  hydrocortisone 2.5 % cream Apply topically 2 (two) times daily as needed.   Yes Historical Provider, MD  levothyroxine (SYNTHROID, LEVOTHROID) 50 MCG tablet Take 50 mcg by mouth daily before breakfast.   Yes Historical Provider, MD  lisinopril (PRINIVIL,ZESTRIL) 2.5 MG tablet Take 1 tablet (2.5 mg total) by mouth daily. 12/21/15  Yes Vaughan Basta, MD  LORazepam (ATIVAN) 0.5 MG tablet Take 0.25 mg by mouth 2 (two) times daily.   Yes Historical Provider, MD  magnesium hydroxide (MILK OF MAGNESIA) 400 MG/5ML suspension Take 30 mLs by mouth at bedtime as needed for mild constipation.   Yes Historical Provider, MD  Melatonin 3 MG TABS Take 3 mg by mouth at bedtime.   Yes Historical Provider, MD  Menthol-Zinc Oxide (RISAMINE) 0.44-20.625 % OINT Apply 1 application topically 2 (two) times daily. For 10 days. May use PRN after 10 days.   Yes Historical Provider, MD  miconazole (MICOTIN) 2 % cream Apply 1 application topically 3 (three) times daily as needed.   Yes Historical Provider, MD  Multiple Vitamin (MULTIVITAMIN WITH MINERALS) TABS tablet Take 1 tablet by mouth daily.   Yes Historical Provider, MD  Omega-3 Fatty Acids (FISH OIL) 1000 MG CAPS Take 1,000 mg by mouth daily.   Yes Historical Provider, MD  pantoprazole (PROTONIX) 40 MG tablet Take 1 tablet (40 mg total) by mouth daily. 03/22/16  Yes Srikar Sudini, MD  rosuvastatin (CRESTOR) 5 MG tablet Take 1 tablet (5 mg total) by mouth daily at 6 PM. 12/21/15  Yes Vaughan Basta, MD  sertraline (ZOLOFT) 25 MG tablet Take 25 mg by mouth daily.   Yes Historical Provider, MD  Skin Protectants, Misc. (MINERIN) CREA Apply topically 2 (two)  times daily.   Yes Historical Provider, MD  tamsulosin (FLOMAX) 0.4 MG CAPS capsule Take 1 capsule (0.4 mg total) by mouth daily after supper. 12/21/15  Yes Vaughan Basta, MD  triamcinolone cream (KENALOG) 0.5 % Apply 1 application topically daily.   Yes Historical Provider, MD    Allergies Review of patient's allergies indicates no known allergies.  Family History  Problem Relation Age of Onset  . Heart disease Father   . Cancer Brother     Social History Social History  Substance Use Topics  . Smoking status: Former Research scientist (life sciences)  . Smokeless tobacco: Never Used  . Alcohol use No    Review of Systems Constitutional: Negative for fever. Eyes: Negative for visual changes. ENT: Negative for facial injury or neck injury Cardiovascular: Negative for chest injury. Respiratory: Negative for shortness of breath. Negative  for chest wall injury. Gastrointestinal: Negative for abdominal pain or injury. Genitourinary: Negative for dysuria. Musculoskeletal: Negative for back injury, negative for arm or leg pain. Skin: Negative for laceration/abrasions. Neurological: Negative for head injury.  ________________________________________   PHYSICAL EXAM:  VITAL SIGNS: Vitals:   04/27/16 0004  BP: 111/86  Pulse: 60  Resp: 18  Temp: 97.7 F (36.5 C)    Constitutional: Alert and oriented x 1. No acute distress. Does not appear intoxicated. HEENT Head: Normocephalic and atraumatic. Face: No facial bony tenderness. Stable midface Ears: No hemotympanum bilaterally. No Battle sign Eyes: No eye injury. PERRL. No raccoon eyes Nose: Nontender. No epistaxis. No rhinorrhea Mouth/Throat: Mucous membranes are moist. No oropharyngeal blood. No dental injury. Airway patent without stridor. Normal voice. Neck: no C-collar in place. No midline c-spine tenderness.  Cardiovascular: Normal rate, regular rhythm. Normal and symmetric distal pulses are present in all extremities. Pulmonary/Chest:  Chest wall is stable and nontender to palpation/compression. Normal respiratory effort. Breath sounds are normal. No crepitus.  Abdominal: Soft, nontender, non distended. Musculoskeletal: Nontender with normal full range of motion in all extremities. No deformities. No thoracic or lumbar midline spinal tenderness. Pelvis is stable. Skin: Skin is warm, dry and intact. No abrasions or contutions. Psychiatric: Speech and behavior are appropriate. Neurological: Normal speech and language. Moves all extremities to command. No gross focal neurologic deficits are appreciated.  Glascow Coma Score: 4 - Opens eyes on own 6 - Follows simple motor commands 5 - Alert and oriented GCS: 15   ____________________________________________   LABS (all labs ordered are listed, but only abnormal results are displayed)  Labs Reviewed  CBC WITH DIFFERENTIAL/PLATELET - Abnormal; Notable for the following:       Result Value   RBC 3.46 (*)    Hemoglobin 11.2 (*)    HCT 31.9 (*)    RDW 16.2 (*)    Platelets 149 (*)    All other components within normal limits  BASIC METABOLIC PANEL - Abnormal; Notable for the following:    Potassium 3.2 (*)    Glucose, Bld 116 (*)    BUN 24 (*)    GFR calc non Af Amer 57 (*)    Anion gap 4 (*)    All other components within normal limits  URINE CULTURE  TROPONIN I  URINALYSIS COMPLETEWITH MICROSCOPIC ([facility] ONLY)   ____________________________________________  EKG  ED ECG REPORT I, Rudene Re, the attending physician, personally viewed and interpreted this ECG. Normal sinus rhythm, rate of 61, normal intervals, normal axis, no ST elevations or depressions. Unchanged from prior  ____________________________________________  RADIOLOGY  Head CT and CT cervical spine: Negative  ____________________________________________   PROCEDURES  Procedure(s) performed: None Procedures Critical Care performed:   None ____________________________________________   INITIAL IMPRESSION / ASSESSMENT AND PLAN / ED COURSE  82 y.o. male a history of dementia, BPH, CAD, paroxysmal atrial fibrillation, prostate cancer, and multiple visits to the ED for falls who presents for unwitnessed fall. Patient is not on blood thinners. Patient has no complaints at this time, physical exam within normal limits with no evidence of injury. Head CT and CT cervical spine negative. EKG and labs within normal limits. Patient remains neurologically intact at his baseline. We'll discharge back to the facility.  Clinical Course    Pertinent labs & imaging results that were available during my care of the patient were reviewed by me and considered in my medical decision making (see chart for details).    ____________________________________________  FINAL CLINICAL IMPRESSION(S) / ED DIAGNOSES  Final diagnoses:  Fall, initial encounter      NEW MEDICATIONS STARTED DURING THIS VISIT:  New Prescriptions   No medications on file     Note:  This document was prepared using Dragon voice recognition software and may include unintentional dictation errors.    Rudene Re, MD 04/27/16 [phone]

## 2016-05-01 ENCOUNTER — Emergency Department: Payer: Medicare Other

## 2016-05-01 ENCOUNTER — Emergency Department
Admission: EM | Admit: 2016-05-01 | Discharge: 2016-05-01 | Disposition: A | Discharge status: Home / Self Care | Payer: Medicare Other | Attending: Emergency Medicine | Admitting: Emergency Medicine

## 2016-05-01 ENCOUNTER — Encounter: Payer: Self-pay | Admitting: Emergency Medicine

## 2016-05-01 DIAGNOSIS — I2510 Atherosclerotic heart disease of native coronary artery without angina pectoris: Secondary | ICD-10-CM | POA: Diagnosis not present

## 2016-05-01 DIAGNOSIS — E039 Hypothyroidism, unspecified: Secondary | ICD-10-CM | POA: Insufficient documentation

## 2016-05-01 DIAGNOSIS — Y9289 Other specified places as the place of occurrence of the external cause: Secondary | ICD-10-CM | POA: Diagnosis not present

## 2016-05-01 DIAGNOSIS — Z79899 Other long term (current) drug therapy: Secondary | ICD-10-CM | POA: Diagnosis not present

## 2016-05-01 DIAGNOSIS — Z8546 Personal history of malignant neoplasm of prostate: Secondary | ICD-10-CM | POA: Insufficient documentation

## 2016-05-01 DIAGNOSIS — Z951 Presence of aortocoronary bypass graft: Secondary | ICD-10-CM | POA: Diagnosis not present

## 2016-05-01 DIAGNOSIS — Y9389 Activity, other specified: Secondary | ICD-10-CM | POA: Insufficient documentation

## 2016-05-01 DIAGNOSIS — Z048 Encounter for examination and observation for other specified reasons: Secondary | ICD-10-CM | POA: Insufficient documentation

## 2016-05-01 DIAGNOSIS — Y999 Unspecified external cause status: Secondary | ICD-10-CM | POA: Diagnosis not present

## 2016-05-01 DIAGNOSIS — Z87891 Personal history of nicotine dependence: Secondary | ICD-10-CM | POA: Diagnosis not present

## 2016-05-01 DIAGNOSIS — N390 Urinary tract infection, site not specified: Secondary | ICD-10-CM | POA: Insufficient documentation

## 2016-05-01 DIAGNOSIS — W19XXXA Unspecified fall, initial encounter: Secondary | ICD-10-CM

## 2016-05-01 DIAGNOSIS — I6782 Cerebral ischemia: Secondary | ICD-10-CM | POA: Insufficient documentation

## 2016-05-01 DIAGNOSIS — X58XXXA Exposure to other specified factors, initial encounter: Secondary | ICD-10-CM | POA: Insufficient documentation

## 2016-05-01 LAB — BASIC METABOLIC PANEL
ANION GAP: 7 (ref 5–15)
BUN: 15 mg/dL (ref 6–20)
CHLORIDE: 104 mmol/L (ref 101–111)
CO2: 25 mmol/L (ref 22–32)
Calcium: 9 mg/dL (ref 8.9–10.3)
Creatinine, Ser: 1.06 mg/dL (ref 0.61–1.24)
GFR calc non Af Amer: >60 mL/min (ref >60)
Glucose, Bld: 98 mg/dL (ref 65–99)
Potassium: 4 mmol/L (ref 3.5–5.1)
Sodium: 136 mmol/L (ref 135–145)

## 2016-05-01 LAB — URINALYSIS COMPLETE WITH MICROSCOPIC (ARMC ONLY)
Bilirubin Urine: NEGATIVE
Glucose, UA: NEGATIVE mg/dL
Hgb urine dipstick: NEGATIVE
KETONES UR: NEGATIVE mg/dL
Nitrite: POSITIVE — AB
PROTEIN: NEGATIVE mg/dL
SQUAMOUS EPITHELIAL / LPF: NONE SEEN
Specific Gravity, Urine: 1.009 (ref 1.005–1.030)
pH: 8 (ref 5.0–8.0)

## 2016-05-01 LAB — CBC
HEMATOCRIT: 33.6 % — AB (ref 40.0–52.0)
HEMOGLOBIN: 11.4 g/dL — AB (ref 13.0–18.0)
MCH: 31.7 pg (ref 26.0–34.0)
MCHC: 33.9 g/dL (ref 32.0–36.0)
MCV: 93.3 fL (ref 80.0–100.0)
Platelets: 171 10*3/uL (ref 150–440)
RBC: 3.6 MIL/uL — ABNORMAL LOW (ref 4.40–5.90)
RDW: 15.8 % — ABNORMAL HIGH (ref 11.5–14.5)
WBC: 5.3 10*3/uL (ref 3.8–10.6)

## 2016-05-01 LAB — TROPONIN I: Troponin I: <0.03 ng/mL (ref <0.03)

## 2016-05-01 MED ORDER — NITROFURANTOIN MONOHYD MACRO 100 MG PO CAPS: 100.0000 mg | ORAL_CAPSULE | Freq: Two times a day (BID) | ORAL | 0 refills | Status: AC

## 2016-05-01 MED ORDER — NITROFURANTOIN MONOHYD MACRO 100 MG PO CAPS
100.0000 mg | ORAL_CAPSULE | Freq: Once | ORAL | Status: AC
  Administered 2016-05-01: 100 mg via ORAL
  Filled 2016-05-01: qty 1

## 2016-05-01 NOTE — Discharge Instructions (Addendum)
Follow up with your doctor in 2 days. Return to the emergency department for severe headache, altered mental status, vomiting, changes in vision, chest pain, shortness of breath, back pain, abdominal pain, or any new symptoms concerning to you.  You have been seen in the Emergency Department (ED)  today for a urinary tract infection.  Most UTIs are caused by bacteria and need to be treated with antibiotics. It is important to complete your treatment so that the infection does not get worse. Take your antibiotics fully even if your symptoms start to get better after the first few doses. Drink PLENTY of fluids to help clear the infection.  Follow-up with your doctor or return to the ER immediately if your symptoms are getting worse, if you develop a fever, if you develop abdominal or flank pain, or if you start to vomit. Otherwise follow up with your doctor in 1 week if your symptoms are improving.   When should you call for help?  Call your doctor now or seek immediate medical care if:  Symptoms such as a fever, chills, nausea, or vomiting get worse or happen for the first time.  You have new pain in your back just below your rib cage. This is called flank pain.  There is new blood or pus in your urine.  You are not able to take or keep down your antibiotics. Your symptoms are not getting better after 48 hours of antibiotic treatment  Watch closely for changes in your health, and be sure to contact your doctor if:  You are not getting better after taking an antibiotic for 2 days.  Your symptoms go away but then come back.   How can you care for yourself at home?  Take your antibiotics as prescribed. Do not stop taking them just because you feel better. You need to take the full course of antibiotics.  Take your medicines exactly as prescribed. Your doctor may have prescribed a medicine, such as phenazopyridine (Pyridium), to help relieve pain when you urinate. This turns your urine orange. You  may stop taking it when your symptoms get better. But be sure to take all of your antibiotics, which treat the infection.  Drink extra water and juices such as cranberry and blueberry juices for the next day or two. This will help make the urine less concentrated and help wash out the bacteria causing the infection. (If you have kidney, heart, or liver disease and have to limit your fluids, talk with your doctor before you increase your fluid intake.)  Avoid drinks that are carbonated or have caffeine. They can irritate the bladder.  Urinate often. Try to empty your bladder each time.  To relieve pain, take a hot bath or lay a heating pad (set on low) over your lower belly or genital area. Never go to sleep with a heating pad in place.  To help prevent UTIs  Drink plenty of fluids, enough so that your urine is light yellow or clear like water. If you have kidney, heart, or liver disease and have to limit fluids, talk with your doctor before you increase the amount of fluids you drink.  Urinate when you have the urge. Do not hold your urine for a long time. Urinate before you go to sleep.  Keep your vagina/ penis clean.

## 2016-05-01 NOTE — ED Provider Notes (Signed)
[facility] Emergency Department Provider Note  ____________________________________________  Time seen: Approximately 7:45 PM  I have reviewed the triage vital signs and the nursing notes.   HISTORY  Chief Complaint Fall  Level 5 caveat:  Portions of the history and physical were unable to be obtained due to dementia   HPI Adam Nielsen is a 82 y.o. male with h/o dementia, CAD, hyperlipidemia, prostate cancer, paroxysmal atrial fibrillation no longer on blood thinners who presents for evaluation of fall. Patient has had multiple falls. Patient has been seen here 5 times in the last month for falls always with negative imaging and labs. Patient has no recollection of the fall. He was found sitting on the floor in his room at [facility]. Patient is smiling and pleasant. He denies any headache, neck pain, back pain, abdominal pain, chest pain, extremity pain. Review of patient's medical chart from SNF shows the patient is no longer on blood thinners.  Past Medical History:  Diagnosis Date  . Acute bronchitis 11/22/2015   . BPH (benign prostatic hypertrophy)   . CAD (coronary artery disease)    a. s/p CABG approx 1997; b. patient of Dr. Wynonia Lawman, MD  . Chronic indwelling Foley catheter   . Dementia   . Dementia   . Fall    11/22/2015    . Hyperlipidemia   . Hypothyroid   . PAF (paroxysmal atrial fibrillation) (HCC)    a. not on long term full dose anticoagulation  . Prostate CA ([place])   . Urinary tract infection 11/22/2015     Patient Active Problem List   Diagnosis Date Noted  . GI bleed 03/19/2016  . CAD (coronary artery disease) 03/19/2016  . Pressure ulcer 12/18/2015  . Urinary retention   . Paroxysmal atrial fibrillation (HCC)   . Typical atrial flutter ([place])   . NSTEMI (non-ST elevated myocardial infarction) ([place]) 12/14/2015  . Atrial fibrillation with rapid ventricular response ([place])   . S/P TURP   . Hematuria   . S/P CABG (coronary artery  bypass graft)   . Hypotension   . BPH (benign prostatic hypertrophy) with urinary retention 12/12/2015  . UTI (urinary tract infection) 11/23/2015  . UTI (lower urinary tract infection) 11/22/2015  . Prostate cancer ([place]) 11/22/2015  . Hypothyroidism 11/22/2015  . Atrial fibrillation ([place]) 11/22/2015  . Dementia 11/22/2015  . Fall 11/22/2015  . Bronchitis 11/22/2015    Past Surgical History:  Procedure Laterality Date  . CORONARY ARTERY BYPASS GRAFT    . TRANSURETHRAL RESECTION OF PROSTATE N/A 12/12/2015   Procedure: TRANSURETHRAL RESECTION OF THE PROSTATE ;  Surgeon: Kathie Rhodes, MD;  Location: [facility];  Service: Urology;  Laterality: N/A;    Prior to Admission medications   Medication Sig Start Date End Date Taking? Authorizing Provider  acetaminophen (TYLENOL) 500 MG tablet Take 500 mg by mouth every 4 (four) hours as needed.    Historical Provider, MD  albuterol (PROVENTIL HFA;VENTOLIN HFA) 108 (90 Base) MCG/ACT inhaler Inhale 2 puffs into the lungs every 6 (six) hours as needed for wheezing or shortness of breath. 11/24/15   Loletha Grayer, MD  Amino Acids-Protein Hydrolys (FEEDING SUPPLEMENT, PRO-STAT SUGAR FREE 64,) LIQD Take 30 mLs by mouth 2 (two) times daily.    Historical Provider, MD  amiodarone (PACERONE) 100 MG tablet Take 1 tablet (100 mg total) by mouth daily. Patient taking differently: Take 100 mg by mouth 2 (two) times daily.  02/08/16   Minna Merritts, MD  bicalutamide (  CASODEX) 50 MG tablet Take 50 mg by mouth daily. Reported on 12/13/2015    Historical Provider, MD  divalproex (DEPAKOTE SPRINKLE) 125 MG capsule Take 250 mg by mouth 2 (two) times daily.    Historical Provider, MD  donepezil (ARICEPT) 10 MG tablet Take 10 mg by mouth at bedtime.     Historical Provider, MD  escitalopram (LEXAPRO) 5 MG tablet Take 5 mg by mouth daily.    Historical Provider, MD  ferrous sulfate 325 (65 FE) MG tablet Take 1 tablet (325 mg total) by mouth 2 (two) times daily with a  meal. Patient taking differently: Take 325 mg by mouth daily.  03/22/16   Srikar Sudini, MD  fexofenadine (ALLEGRA) 180 MG tablet Take 180 mg by mouth daily.    Historical Provider, MD  finasteride (PROSCAR) 5 MG tablet Take 5 mg by mouth daily.    Historical Provider, MD  furosemide (LASIX) 20 MG tablet Take 1 tablet (20 mg total) by mouth daily. 12/22/15   Vaughan Basta, MD  guaifenesin (ROBITUSSIN) 100 MG/5ML syrup Take 200 mg by mouth every 6 (six) hours as needed for cough.    Historical Provider, MD  hydrocortisone 2.5 % cream Apply topically 2 (two) times daily as needed.    Historical Provider, MD  levothyroxine (SYNTHROID, LEVOTHROID) 50 MCG tablet Take 50 mcg by mouth daily before breakfast.    Historical Provider, MD  lisinopril (PRINIVIL,ZESTRIL) 2.5 MG tablet Take 1 tablet (2.5 mg total) by mouth daily. 12/21/15   Vaughan Basta, MD  LORazepam (ATIVAN) 0.5 MG tablet Take 0.25 mg by mouth 2 (two) times daily.    Historical Provider, MD  magnesium hydroxide (MILK OF MAGNESIA) 400 MG/5ML suspension Take 30 mLs by mouth at bedtime as needed for mild constipation.    Historical Provider, MD  Melatonin 3 MG TABS Take 3 mg by mouth at bedtime.    Historical Provider, MD  Menthol-Zinc Oxide (RISAMINE) 0.44-20.625 % OINT Apply 1 application topically 2 (two) times daily. For 10 days. May use PRN after 10 days.    Historical Provider, MD  miconazole (MICOTIN) 2 % cream Apply 1 application topically 3 (three) times daily as needed.    Historical Provider, MD  Multiple Vitamin (MULTIVITAMIN WITH MINERALS) TABS tablet Take 1 tablet by mouth daily.    Historical Provider, MD  nitrofurantoin, macrocrystal-monohydrate, (MACROBID) 100 MG capsule Take 1 capsule (100 mg total) by mouth 2 (two) times daily. 05/01/16 05/06/16  Rudene Re, MD  Omega-3 Fatty Acids (FISH OIL) 1000 MG CAPS Take 1,000 mg by mouth daily.    Historical Provider, MD  pantoprazole (PROTONIX) 40 MG tablet Take 1 tablet  (40 mg total) by mouth daily. 03/22/16   Hillary Bow, MD  rosuvastatin (CRESTOR) 5 MG tablet Take 1 tablet (5 mg total) by mouth daily at 6 PM. 12/21/15   Vaughan Basta, MD  sertraline (ZOLOFT) 25 MG tablet Take 25 mg by mouth daily.    Historical Provider, MD  Skin Protectants, Misc. (MINERIN) CREA Apply topically 2 (two) times daily.    Historical Provider, MD  tamsulosin (FLOMAX) 0.4 MG CAPS capsule Take 1 capsule (0.4 mg total) by mouth daily after supper. 12/21/15   Vaughan Basta, MD  triamcinolone cream (KENALOG) 0.5 % Apply 1 application topically daily.    Historical Provider, MD    Allergies Review of patient's allergies indicates no known allergies.  Family History  Problem Relation Age of Onset  . Heart disease Father   . Cancer Brother  Social History Social History  Substance Use Topics  . Smoking status: Former Research scientist (life sciences)  . Smokeless tobacco: Never Used  . Alcohol use No    Review of Systems  Constitutional: Negative for fever. Eyes: Negative for visual changes. ENT: Negative for sore throat. Cardiovascular: Negative for chest pain. Respiratory: Negative for shortness of breath. Gastrointestinal: Negative for abdominal pain, vomiting or diarrhea. Genitourinary: Negative for dysuria. Musculoskeletal: Negative for back pain. Skin: Negative for rash. Neurological: Negative for headaches, weakness or numbness.  ____________________________________________   PHYSICAL EXAM:  VITAL SIGNS: ED Triage Vitals  Enc Vitals Group     BP 05/01/16 1915 116/90     Pulse Rate 05/01/16 1915 61     Resp 05/01/16 1915 16     Temp 05/01/16 1915 98.4 F (36.9 C)     Temp Source 05/01/16 1915 Oral     SpO2 05/01/16 1915 97 %     Weight 05/01/16 1915 155 lb 6.4 oz (70.5 kg)     Height 05/01/16 1916 5\' 8"  (1.727 m)     Head Circumference --      Peak Flow --      Pain Score 05/01/16 1926 0     Pain Loc --      Pain Edu? --      Excl. in [place]? --      Constitutional: Alert and oriented x 1. No acute distress. Does not appear intoxicated. HEENT Head: Normocephalic and atraumatic. Face: No facial bony tenderness. Stable midface Ears: No hemotympanum bilaterally. No Battle sign Eyes: No eye injury. PERRL. No raccoon eyes Nose: Nontender. No epistaxis. No rhinorrhea Mouth/Throat: Mucous membranes are moist. No oropharyngeal blood. No dental injury. Airway patent without stridor. Normal voice. Neck: no C-collar in place. No midline c-spine tenderness.  Cardiovascular: Normal rate, regular rhythm. Normal and symmetric distal pulses are present in all extremities. Pulmonary/Chest: Chest wall is stable and nontender to palpation/compression. Normal respiratory effort. Breath sounds are normal. No crepitus.  Abdominal: Soft, nontender, non distended. Musculoskeletal: Nontender with normal full range of motion in all extremities. No deformities. No thoracic or lumbar midline spinal tenderness. Pelvis is stable. Skin: Skin is warm, dry and intact. No abrasions or contutions. Psychiatric: Speech and behavior are appropriate. Neurological: Normal speech and language. Moves all extremities to command. No gross focal neurologic deficits are appreciated.  Glascow Coma Score: 4 - Opens eyes on own 6 - Follows simple motor commands 4 - Seems confused, disoriented GCS: 14    ____________________________________________   LABS (all labs ordered are listed, but only abnormal results are displayed)  Labs Reviewed  URINALYSIS COMPLETEWITH MICROSCOPIC ([facility] ONLY) - Abnormal; Notable for the following:       Result Value   Color, Urine YELLOW (*)    APPearance HAZY (*)    Nitrite POSITIVE (*)    Leukocytes, UA 1+ (*)    Bacteria, UA RARE (*)    All other components within normal limits  CBC - Abnormal; Notable for the following:    RBC 3.60 (*)    Hemoglobin 11.4 (*)    HCT 33.6 (*)    RDW 15.8 (*)    All other components within normal  limits  BASIC METABOLIC PANEL  TROPONIN I   ____________________________________________  EKG  ED ECG REPORT I, Rudene Re, the attending physician, personally viewed and interpreted this ECG.  Atrial fibrillation, rate of 67, normal QRS and QTc intervals, normal axis, no ST elevations or depressions.  ____________________________________________  [id number]  Head CT: negative ____________________________________________   PROCEDURES  Procedure(s) performed: None Procedures Critical Care performed:  None ____________________________________________   INITIAL IMPRESSION / ASSESSMENT AND PLAN / ED COURSE   82 y.o. male with h/o dementia, CAD, chronic foley catheter, hyperlipidemia, prostate cancer, paroxysmal atrial fibrillation no longer on blood thinners, and multiple visits to the ED for fall who presents for evaluation of fall. Patient has no recollection of the fall. Has no complaints at this time. Full physical exam with no injuries. Head is atraumatic. No C-spine tenderness, backs atraumatic with no midline tenderness, full painless range of motion of all extremities, chest wall stable, abdomen soft and nontender. Will get head ct and basic labs.  Clinical Course  Comment By Time  Patient found to have a UTI. No longer has a Foley catheter. His vital signs are within normal limits, abdomen is soft and nontender, no flank tenderness, no blood in the urine. His kidney function is within normal limits. We'll start patient on Macrobid and discharge back to the facility. Remaining of his labs and head CT were within normal limits. Rudene Re, MD 08/15 2250    Pertinent labs & imaging results that were available during my care of the patient were reviewed by me and considered in my medical decision making (see chart for details).    ____________________________________________   FINAL CLINICAL IMPRESSION(S) / ED DIAGNOSES  Final diagnoses:  Fall, initial  encounter  UTI (lower urinary tract infection)      NEW MEDICATIONS STARTED DURING THIS VISIT:  New Prescriptions   NITROFURANTOIN, MACROCRYSTAL-MONOHYDRATE, (MACROBID) 100 MG CAPSULE    Take 1 capsule (100 mg total) by mouth 2 (two) times daily.     Note:  This document was prepared using Dragon voice recognition software and may include unintentional dictation errors.    Rudene Re, MD 05/01/16 2251

## 2016-05-01 NOTE — ED Triage Notes (Signed)
Per [facility]: [facility] called for transport to ED for possible fall. Pt. Was found sitting in the floor, no evidence of injury, denies pain, denies fall or injury.

## 2016-05-20 ENCOUNTER — Emergency Department
Admission: EM | Admit: 2016-05-20 | Discharge: 2016-05-20 | Disposition: A | Discharge status: Home / Self Care | Payer: Medicare Other | Attending: Emergency Medicine | Admitting: Emergency Medicine

## 2016-05-20 ENCOUNTER — Emergency Department: Payer: Medicare Other

## 2016-05-20 DIAGNOSIS — Y999 Unspecified external cause status: Secondary | ICD-10-CM | POA: Insufficient documentation

## 2016-05-20 DIAGNOSIS — Z8546 Personal history of malignant neoplasm of prostate: Secondary | ICD-10-CM | POA: Diagnosis not present

## 2016-05-20 DIAGNOSIS — Y939 Activity, unspecified: Secondary | ICD-10-CM | POA: Diagnosis not present

## 2016-05-20 DIAGNOSIS — E860 Dehydration: Secondary | ICD-10-CM | POA: Insufficient documentation

## 2016-05-20 DIAGNOSIS — Z79899 Other long term (current) drug therapy: Secondary | ICD-10-CM | POA: Insufficient documentation

## 2016-05-20 DIAGNOSIS — Z87891 Personal history of nicotine dependence: Secondary | ICD-10-CM | POA: Insufficient documentation

## 2016-05-20 DIAGNOSIS — Y9289 Other specified places as the place of occurrence of the external cause: Secondary | ICD-10-CM | POA: Diagnosis not present

## 2016-05-20 DIAGNOSIS — F0390 Unspecified dementia without behavioral disturbance: Secondary | ICD-10-CM | POA: Insufficient documentation

## 2016-05-20 DIAGNOSIS — E039 Hypothyroidism, unspecified: Secondary | ICD-10-CM | POA: Diagnosis not present

## 2016-05-20 DIAGNOSIS — W19XXXA Unspecified fall, initial encounter: Secondary | ICD-10-CM | POA: Insufficient documentation

## 2016-05-20 DIAGNOSIS — I2510 Atherosclerotic heart disease of native coronary artery without angina pectoris: Secondary | ICD-10-CM | POA: Insufficient documentation

## 2016-05-20 DIAGNOSIS — Z048 Encounter for examination and observation for other specified reasons: Secondary | ICD-10-CM | POA: Diagnosis present

## 2016-05-20 LAB — CBC WITH DIFFERENTIAL/PLATELET
BASOS ABS: 0.1 10*3/uL (ref 0–0.1)
BASOS PCT: 1 %
Eosinophils Absolute: 0.1 10*3/uL (ref 0–0.7)
Eosinophils Relative: 2 %
HEMATOCRIT: 32.7 % — AB (ref 40.0–52.0)
Hemoglobin: 11.6 g/dL — ABNORMAL LOW (ref 13.0–18.0)
Lymphocytes Relative: 25 %
Lymphs Abs: 1.2 10*3/uL (ref 1.0–3.6)
MCH: 32.5 pg (ref 26.0–34.0)
MCHC: 35.5 g/dL (ref 32.0–36.0)
MCV: 91.4 fL (ref 80.0–100.0)
MONO ABS: 0.3 10*3/uL (ref 0.2–1.0)
Monocytes Relative: 7 %
NEUTROS ABS: 3.1 10*3/uL (ref 1.4–6.5)
Neutrophils Relative %: 65 %
PLATELETS: 158 10*3/uL (ref 150–440)
RBC: 3.58 MIL/uL — ABNORMAL LOW (ref 4.40–5.90)
RDW: 14.7 % — AB (ref 11.5–14.5)
WBC: 4.8 10*3/uL (ref 3.8–10.6)

## 2016-05-20 LAB — BASIC METABOLIC PANEL
ANION GAP: 6 (ref 5–15)
BUN: 23 mg/dL — ABNORMAL HIGH (ref 6–20)
CALCIUM: 9.1 mg/dL (ref 8.9–10.3)
CO2: 25 mmol/L (ref 22–32)
Chloride: 107 mmol/L (ref 101–111)
Creatinine, Ser: 1.48 mg/dL — ABNORMAL HIGH (ref 0.61–1.24)
GFR, EST AFRICAN AMERICAN: 47 mL/min — AB (ref >60)
GFR, EST NON AFRICAN AMERICAN: 40 mL/min — AB (ref >60)
Glucose, Bld: 104 mg/dL — ABNORMAL HIGH (ref 65–99)
Potassium: 3.9 mmol/L (ref 3.5–5.1)
Sodium: 138 mmol/L (ref 135–145)

## 2016-05-20 MED ORDER — SODIUM CHLORIDE 0.9 % IV BOLUS (SEPSIS)
500.0000 mL | Freq: Once | INTRAVENOUS | Status: AC
  Administered 2016-05-20: 500 mL via INTRAVENOUS

## 2016-05-20 NOTE — ED Triage Notes (Signed)
Pt came to ED via EMS from [facility] after falling out of bed. Fall unwitnessed. Pt c/o buttocks pain w/ EMS. Currently denying any pain. History of dementia.

## 2016-05-20 NOTE — ED Notes (Signed)
Patient daughter, Mary Sella request call back when patient is discharged.   Call back: [place]

## 2016-05-20 NOTE — Discharge Instructions (Signed)
Please seek medical attention for any high fevers, chest pain, shortness of breath, change in behavior, persistent vomiting, bloody stool or any other new or concerning symptoms.  

## 2016-05-20 NOTE — ED Provider Notes (Signed)
[facility] Emergency Department Provider Note    ____________________________________________   I have reviewed the triage vital signs and the nursing notes.   HISTORY  Chief Complaint Fall   History limited by dementia  HPI Adam Nielsen is a 82 y.o. male with history of dementia who presents from living facility today after an unwitnessed fall. The patient does have a history of falling out of his bed and it appears this happened again today. The patient himself is not oriented to the event and cannot give any history. He does however deny any pain on my exam. He denies any recent illnesses or fevers.     Past Medical History:  Diagnosis Date  . Acute bronchitis 11/22/2015   . BPH (benign prostatic hypertrophy)   . CAD (coronary artery disease)    a. s/p CABG approx 1997; b. patient of Dr. Wynonia Lawman, MD  . Chronic indwelling Foley catheter   . Dementia   . Dementia   . Fall    11/22/2015    . Hyperlipidemia   . Hypothyroid   . PAF (paroxysmal atrial fibrillation) (HCC)    a. not on long term full dose anticoagulation  . Prostate CA ([place])   . Urinary tract infection 11/22/2015     Patient Active Problem List   Diagnosis Date Noted  . GI bleed 03/19/2016  . CAD (coronary artery disease) 03/19/2016  . Pressure ulcer 12/18/2015  . Urinary retention   . Paroxysmal atrial fibrillation (HCC)   . Typical atrial flutter ([place])   . NSTEMI (non-ST elevated myocardial infarction) ([place]) 12/14/2015  . Atrial fibrillation with rapid ventricular response ([place])   . S/P TURP   . Hematuria   . S/P CABG (coronary artery bypass graft)   . Hypotension   . BPH (benign prostatic hypertrophy) with urinary retention 12/12/2015  . UTI (urinary tract infection) 11/23/2015  . UTI (lower urinary tract infection) 11/22/2015  . Prostate cancer ([place]) 11/22/2015  . Hypothyroidism 11/22/2015  . Atrial fibrillation ([place]) 11/22/2015  . Dementia 11/22/2015  . Fall  11/22/2015  . Bronchitis 11/22/2015    Past Surgical History:  Procedure Laterality Date  . CORONARY ARTERY BYPASS GRAFT    . TRANSURETHRAL RESECTION OF PROSTATE N/A 12/12/2015   Procedure: TRANSURETHRAL RESECTION OF THE PROSTATE ;  Surgeon: Kathie Rhodes, MD;  Location: [facility];  Service: Urology;  Laterality: N/A;    Prior to Admission medications   Medication Sig Start Date End Date Taking? Authorizing Provider  acetaminophen (TYLENOL) 500 MG tablet Take 500 mg by mouth every 4 (four) hours as needed.    Historical Provider, MD  albuterol (PROVENTIL HFA;VENTOLIN HFA) 108 (90 Base) MCG/ACT inhaler Inhale 2 puffs into the lungs every 6 (six) hours as needed for wheezing or shortness of breath. 11/24/15   Loletha Grayer, MD  Amino Acids-Protein Hydrolys (FEEDING SUPPLEMENT, PRO-STAT SUGAR FREE 64,) LIQD Take 30 mLs by mouth 2 (two) times daily.    Historical Provider, MD  amiodarone (PACERONE) 100 MG tablet Take 1 tablet (100 mg total) by mouth daily. Patient taking differently: Take 100 mg by mouth 2 (two) times daily.  02/08/16   Minna Merritts, MD  bicalutamide (CASODEX) 50 MG tablet Take 50 mg by mouth daily. Reported on 12/13/2015    Historical Provider, MD  divalproex (DEPAKOTE SPRINKLE) 125 MG capsule Take 250 mg by mouth 2 (two) times daily.    Historical Provider, MD  donepezil (ARICEPT) 10 MG tablet Take 10 mg by mouth  at bedtime.     Historical Provider, MD  escitalopram (LEXAPRO) 5 MG tablet Take 5 mg by mouth daily.    Historical Provider, MD  ferrous sulfate 325 (65 FE) MG tablet Take 1 tablet (325 mg total) by mouth 2 (two) times daily with a meal. Patient taking differently: Take 325 mg by mouth daily.  03/22/16   Srikar Sudini, MD  fexofenadine (ALLEGRA) 180 MG tablet Take 180 mg by mouth daily.    Historical Provider, MD  finasteride (PROSCAR) 5 MG tablet Take 5 mg by mouth daily.    Historical Provider, MD  furosemide (LASIX) 20 MG tablet Take 1 tablet (20 mg total) by mouth  daily. 12/22/15   Vaughan Basta, MD  guaifenesin (ROBITUSSIN) 100 MG/5ML syrup Take 200 mg by mouth every 6 (six) hours as needed for cough.    Historical Provider, MD  hydrocortisone 2.5 % cream Apply topically 2 (two) times daily as needed.    Historical Provider, MD  levothyroxine (SYNTHROID, LEVOTHROID) 50 MCG tablet Take 50 mcg by mouth daily before breakfast.    Historical Provider, MD  lisinopril (PRINIVIL,ZESTRIL) 2.5 MG tablet Take 1 tablet (2.5 mg total) by mouth daily. 12/21/15   Vaughan Basta, MD  LORazepam (ATIVAN) 0.5 MG tablet Take 0.25 mg by mouth 2 (two) times daily.    Historical Provider, MD  magnesium hydroxide (MILK OF MAGNESIA) 400 MG/5ML suspension Take 30 mLs by mouth at bedtime as needed for mild constipation.    Historical Provider, MD  Melatonin 3 MG TABS Take 3 mg by mouth at bedtime.    Historical Provider, MD  Menthol-Zinc Oxide (RISAMINE) 0.44-20.625 % OINT Apply 1 application topically 2 (two) times daily. For 10 days. May use PRN after 10 days.    Historical Provider, MD  miconazole (MICOTIN) 2 % cream Apply 1 application topically 3 (three) times daily as needed.    Historical Provider, MD  Multiple Vitamin (MULTIVITAMIN WITH MINERALS) TABS tablet Take 1 tablet by mouth daily.    Historical Provider, MD  Omega-3 Fatty Acids (FISH OIL) 1000 MG CAPS Take 1,000 mg by mouth daily.    Historical Provider, MD  pantoprazole (PROTONIX) 40 MG tablet Take 1 tablet (40 mg total) by mouth daily. 03/22/16   Hillary Bow, MD  rosuvastatin (CRESTOR) 5 MG tablet Take 1 tablet (5 mg total) by mouth daily at 6 PM. 12/21/15   Vaughan Basta, MD  sertraline (ZOLOFT) 25 MG tablet Take 25 mg by mouth daily.    Historical Provider, MD  Skin Protectants, Misc. (MINERIN) CREA Apply topically 2 (two) times daily.    Historical Provider, MD  tamsulosin (FLOMAX) 0.4 MG CAPS capsule Take 1 capsule (0.4 mg total) by mouth daily after supper. 12/21/15   Vaughan Basta, MD   triamcinolone cream (KENALOG) 0.5 % Apply 1 application topically daily.    Historical Provider, MD    Allergies Review of patient's allergies indicates no known allergies.  Family History  Problem Relation Age of Onset  . Heart disease Father   . Cancer Brother     Social History Social History  Substance Use Topics  . Smoking status: Former Research scientist (life sciences)  . Smokeless tobacco: Never Used  . Alcohol use No    Review of Systems  Constitutional: Negative for fever. Cardiovascular: Negative for chest pain. Respiratory: Negative for shortness of breath. Gastrointestinal: Negative for abdominal pain, vomiting and diarrhea. Neurological: Negative for headaches, focal weakness or numbness.  10-point ROS otherwise negative.  ____________________________________________   PHYSICAL  EXAM:  VITAL SIGNS: ED Triage Vitals [05/20/16 1659]  Enc Vitals Group     BP 115/70     Pulse Rate 63     Resp 16     Temp 97.9 F (36.6 C)     Temp Source Oral     SpO2 100 %   Constitutional: Awake and alert. In no acute distress. Not oriented. Eyes: Conjunctivae are normal. Normal extraocular movements. ENT   Head: Normocephalic and atraumatic.   Nose: No congestion/rhinnorhea.   Mouth/Throat: Mucous membranes are moist.   Neck: No stridor. Hematological/Lymphatic/Immunilogical: No cervical lymphadenopathy. Cardiovascular: Normal rate, regular rhythm.  No murmurs, rubs, or gallops. Respiratory: Normal respiratory effort without tachypnea nor retractions. Breath sounds are clear and equal bilaterally. No wheezes/rales/rhonchi. Gastrointestinal: Soft and nontender. No distention.  Genitourinary: Deferred Musculoskeletal: Normal range of motion in all extremities. Mild tenderness to palpation of the left shoulder. Neurologic:  Awake and alert. Not oriented. Appears to move all extremities. Sensation appears grossly intact. Skin:  Skin is warm, dry and intact. No rash  noted.   ____________________________________________    LABS (pertinent positives/negatives)  Labs Reviewed  CBC WITH DIFFERENTIAL/PLATELET - Abnormal; Notable for the following:       Result Value   RBC 3.58 (*)    Hemoglobin 11.6 (*)    HCT 32.7 (*)    RDW 14.7 (*)    All other components within normal limits  BASIC METABOLIC PANEL - Abnormal; Notable for the following:    Glucose, Bld 104 (*)    BUN 23 (*)    Creatinine, Ser 1.48 (*)    GFR calc non Af Amer 40 (*)    GFR calc Af Amer 47 (*)    All other components within normal limits     ____________________________________________   EKG  None  ____________________________________________    RADIOLOGY  CT head IMPRESSION:  No evidence of acute intracranial abnormality.    Atrophy and chronic small-vessel white matter ischemic changes.      Left Shoulder IMPRESSION:  No acute abnormality.    ____________________________________________   PROCEDURES  Procedures  ____________________________________________   INITIAL IMPRESSION / ASSESSMENT AND PLAN / ED COURSE  Pertinent labs & imaging results that were available during my care of the patient were reviewed by me and considered in my medical decision making (see chart for details).  Patient presented to the emergency department today after an unwitnessed fall. Head CT and left shoulder x-rays negative. Reading was mildly elevated over patient's baseline. Will give fluids. Will plan on discharging him back to facility.  ____________________________________________   FINAL CLINICAL IMPRESSION(S) / ED DIAGNOSES  Final diagnoses:  Fall, initial encounter  Dehydration   Note: This dictation was prepared with Dragon dictation. Any transcriptional errors that result from this process are unintentional    Nance Pear, MD 05/20/16 [place]

## 2016-05-20 NOTE — ED Notes (Signed)
Report called to Linus Orn, Med tech at [facility].

## 2016-05-20 NOTE — ED Notes (Signed)
Spoke with pt son-in-law Octavia Bruckner, him and his wife are out of town and are not available to transport patient back to [facility].

## 2016-06-22 ENCOUNTER — Emergency Department
Admission: EM | Admit: 2016-06-22 | Discharge: 2016-06-22 | Disposition: A | Discharge status: Home / Self Care | Payer: Medicare Other | Attending: Emergency Medicine | Admitting: Emergency Medicine

## 2016-06-22 ENCOUNTER — Encounter: Payer: Self-pay | Admitting: *Deleted

## 2016-06-22 ENCOUNTER — Emergency Department: Payer: Medicare Other

## 2016-06-22 DIAGNOSIS — Y999 Unspecified external cause status: Secondary | ICD-10-CM | POA: Insufficient documentation

## 2016-06-22 DIAGNOSIS — W19XXXA Unspecified fall, initial encounter: Secondary | ICD-10-CM | POA: Diagnosis not present

## 2016-06-22 DIAGNOSIS — E039 Hypothyroidism, unspecified: Secondary | ICD-10-CM | POA: Diagnosis not present

## 2016-06-22 DIAGNOSIS — Z87891 Personal history of nicotine dependence: Secondary | ICD-10-CM | POA: Diagnosis not present

## 2016-06-22 DIAGNOSIS — Z8546 Personal history of malignant neoplasm of prostate: Secondary | ICD-10-CM | POA: Insufficient documentation

## 2016-06-22 DIAGNOSIS — Z951 Presence of aortocoronary bypass graft: Secondary | ICD-10-CM | POA: Insufficient documentation

## 2016-06-22 DIAGNOSIS — Z79899 Other long term (current) drug therapy: Secondary | ICD-10-CM | POA: Diagnosis not present

## 2016-06-22 DIAGNOSIS — S0003XA Contusion of scalp, initial encounter: Secondary | ICD-10-CM | POA: Insufficient documentation

## 2016-06-22 DIAGNOSIS — Y9289 Other specified places as the place of occurrence of the external cause: Secondary | ICD-10-CM | POA: Diagnosis not present

## 2016-06-22 DIAGNOSIS — S0990XA Unspecified injury of head, initial encounter: Secondary | ICD-10-CM | POA: Diagnosis present

## 2016-06-22 DIAGNOSIS — I2510 Atherosclerotic heart disease of native coronary artery without angina pectoris: Secondary | ICD-10-CM | POA: Diagnosis not present

## 2016-06-22 DIAGNOSIS — Y939 Activity, unspecified: Secondary | ICD-10-CM | POA: Diagnosis not present

## 2016-06-22 LAB — CBC WITH DIFFERENTIAL/PLATELET
Basophils Absolute: 0.1 10*3/uL (ref 0–0.1)
Basophils Relative: 1 %
EOS PCT: 1 %
Eosinophils Absolute: 0.1 10*3/uL (ref 0–0.7)
HCT: 32.6 % — ABNORMAL LOW (ref 40.0–52.0)
Hemoglobin: 11.2 g/dL — ABNORMAL LOW (ref 13.0–18.0)
LYMPHS ABS: 1.3 10*3/uL (ref 1.0–3.6)
LYMPHS PCT: 21 %
MCH: 31.3 pg (ref 26.0–34.0)
MCHC: 34.2 g/dL (ref 32.0–36.0)
MCV: 91.5 fL (ref 80.0–100.0)
MONO ABS: 0.4 10*3/uL (ref 0.2–1.0)
Monocytes Relative: 7 %
Neutro Abs: 4.2 10*3/uL (ref 1.4–6.5)
Neutrophils Relative %: 70 %
PLATELETS: 159 10*3/uL (ref 150–440)
RBC: 3.56 MIL/uL — ABNORMAL LOW (ref 4.40–5.90)
RDW: 14.3 % (ref 11.5–14.5)
WBC: 6 10*3/uL (ref 3.8–10.6)

## 2016-06-22 LAB — BASIC METABOLIC PANEL
Anion gap: 8 (ref 5–15)
BUN: 22 mg/dL — AB (ref 6–20)
CHLORIDE: 105 mmol/L (ref 101–111)
CO2: 23 mmol/L (ref 22–32)
Calcium: 8.6 mg/dL — ABNORMAL LOW (ref 8.9–10.3)
Creatinine, Ser: 1.63 mg/dL — ABNORMAL HIGH (ref 0.61–1.24)
GFR calc Af Amer: 41 mL/min — ABNORMAL LOW (ref >60)
GFR calc non Af Amer: 36 mL/min — ABNORMAL LOW (ref >60)
GLUCOSE: 141 mg/dL — AB (ref 65–99)
POTASSIUM: 3.5 mmol/L (ref 3.5–5.1)
Sodium: 136 mmol/L (ref 135–145)

## 2016-06-22 MED ORDER — SODIUM CHLORIDE 0.9 % IV BOLUS (SEPSIS)
500.0000 mL | Freq: Once | INTRAVENOUS | Status: AC
  Administered 2016-06-22: 500 mL via INTRAVENOUS

## 2016-06-22 NOTE — Discharge Instructions (Signed)
Please return immediately if condition worsens. Please contact her primary physician or the physician you were given for referral. If you have any specialist physicians involved in her treatment and plan please also contact them. Thank you for using [facility]. ° °

## 2016-06-22 NOTE — ED Provider Notes (Signed)
Time Seen: Approximately *1952  I have reviewed the triage notes  Chief Complaint: Fall   History of Present Illness: Adam Nielsen is a 82 y.o. male *who was sent here for evaluation from [place] after he had a witnessed fall. No obvious new injury there was some bruising noted over the back of his head. The patient has a history and she had but is able to answer some questions appropriately and is oriented 2. He denies any new pain and is far as headache neck pain, thoracic, lumbar spine pain etc.  Past Medical History:  Diagnosis Date  . Acute bronchitis 11/22/2015   . BPH (benign prostatic hypertrophy)   . CAD (coronary artery disease)    a. s/p CABG approx 1997; b. patient of Dr. Wynonia Lawman, MD  . Chronic indwelling Foley catheter   . Dementia   . Dementia   . Fall    11/22/2015    . Hyperlipidemia   . Hypothyroid   . PAF (paroxysmal atrial fibrillation) (HCC)    a. not on long term full dose anticoagulation  . Prostate CA ([place])   . Urinary tract infection 11/22/2015     Patient Active Problem List   Diagnosis Date Noted  . GI bleed 03/19/2016  . CAD (coronary artery disease) 03/19/2016  . Pressure ulcer 12/18/2015  . Urinary retention   . Paroxysmal atrial fibrillation (HCC)   . Typical atrial flutter ([place])   . NSTEMI (non-ST elevated myocardial infarction) ([place]) 12/14/2015  . Atrial fibrillation with rapid ventricular response ([place])   . S/P TURP   . Hematuria   . S/P CABG (coronary artery bypass graft)   . Hypotension   . BPH (benign prostatic hypertrophy) with urinary retention 12/12/2015  . UTI (urinary tract infection) 11/23/2015  . UTI (lower urinary tract infection) 11/22/2015  . Prostate cancer ([place]) 11/22/2015  . Hypothyroidism 11/22/2015  . Atrial fibrillation ([place]) 11/22/2015  . Dementia 11/22/2015  . Fall 11/22/2015  . Bronchitis 11/22/2015    Past Surgical History:  Procedure Laterality Date  . CORONARY ARTERY BYPASS GRAFT    .  TRANSURETHRAL RESECTION OF PROSTATE N/A 12/12/2015   Procedure: TRANSURETHRAL RESECTION OF THE PROSTATE ;  Surgeon: Kathie Rhodes, MD;  Location: [facility];  Service: Urology;  Laterality: N/A;    Past Surgical History:  Procedure Laterality Date  . CORONARY ARTERY BYPASS GRAFT    . TRANSURETHRAL RESECTION OF PROSTATE N/A 12/12/2015   Procedure: TRANSURETHRAL RESECTION OF THE PROSTATE ;  Surgeon: Kathie Rhodes, MD;  Location: [facility];  Service: Urology;  Laterality: N/A;    Current Outpatient Rx  . Order #: [id number] Class: Historical Med  . Order #: [id number] Class: Print  . Order #: [id number] Class: Historical Med  . Order #: [id number] Class: Normal  . Order #: [id number] Class: Historical Med  . Order #: [id number] Class: Historical Med  . Order #: [id number] Class: Historical Med  . Order #: [id number] Class: Historical Med  . Order #: [id number] Class: Normal  . Order #: [id number] Class: Historical Med  . Order #: [id number] Class: Historical Med  . Order #: [id number] Class: Normal  . Order #: [id number] Class: Historical Med  . Order #: [id number] Class: Historical Med  . Order #: [id number] Class: Historical Med  . Order #: [id number] Class: Print  . Order #: [id number] Class: Historical Med  . Order #: [id number] Class: Historical Med  . Order #: [id number] Class: Historical Med  . Order #: [id number] Class: Historical Med  . Order #: [id number] Class: Historical Med  . Order #: [id number] Class: Historical Med  .  Order #: [id number] Class: Historical Med  . Order #: [id number] Class: Normal  . Order #: [id number] Class: Print  . Order #: [id number] Class: Historical Med  . Order #: [id number] Class: Historical Med  . Order #: [id number] Class: Normal  . Order #: [id number] Class: Historical Med    Allergies:  Review of patient's allergies indicates no known allergies.  Family History: Family History  Problem Relation Age of Onset  . Heart disease Father   . Cancer Brother     Social History: Social History   Substance Use Topics  . Smoking status: Former Research scientist (life sciences)  . Smokeless tobacco: Never Used  . Alcohol use No     Review of Systems:   10 point review of systems was performed and was otherwise negative:  Constitutional: No fever Eyes: No visual disturbances ENT: No sore throat, ear pain Cardiac: No chest pain Respiratory: No shortness of breath, wheezing, or stridor Abdomen: No abdominal pain, no vomiting, No diarrhea Endocrine: No weight loss, No night sweats Extremities: No peripheral edema, cyanosis Skin: No rashes, easy bruising Neurologic: No focal weakness, trouble with speech or swollowing Urologic: No dysuria, Hematuria, or urinary frequency *Patient was later joined here by his side and he states he's had his normal mental status  Physical Exam:  ED Triage Vitals  Enc Vitals Group     BP 06/22/16 1942 (S) (!) 75/45     Pulse Rate 06/22/16 1942 61     Resp 06/22/16 1942 16     Temp 06/22/16 1942 97.5 F (36.4 C)     Temp Source 06/22/16 1942 Oral     SpO2 06/22/16 1942 97 %     Weight 06/22/16 1943 155 lb (70.3 kg)     Height --      Head Circumference --      Peak Flow --      Pain Score --      Pain Loc --      Pain Edu? --      Excl. in [place]? --     General: Awake , Alert , and Oriented times 3; GCS 15 Head: Normal cephalic , atraumatic. Oral contusion over the occipital area with no step-off or crepitus noted Eyes: Pupils equal , round, reactive to light Nose/Throat: No nasal drainage, patent upper airway without erythema or exudate.  Neck: Supple, Full range of motion, No anterior adenopathy or palpable thyroid masses. Good flexion extension rotation without any pain or neurapraxia Lungs: Clear to ascultation without wheezes , rhonchi, or rales Heart: Regular rate, regular rhythm without murmurs , gallops , or rubs Abdomen: Soft, non tender without rebound, guarding , or rigidity; bowel sounds positive and symmetric in all 4 quadrants. No organomegaly .         Extremities: 2 plus symmetric pulses. No edema, clubbing or cyanosis Neurologic: normal ambulation, Motor symmetric without deficits, sensory intact Skin: warm, dry, no rashes Back shows some kyphosis without any reproducible pain over the thoracic or lumbar spine.  Labs:   All laboratory work was reviewed including any pertinent negatives or positives listed below:  Labs Reviewed  BASIC METABOLIC PANEL - Abnormal; Notable for the following:       Result Value   Glucose, Bld 141 (*)    BUN 22 (*)    Creatinine, Ser 1.63 (*)    Calcium 8.6 (*)    GFR calc non Af Amer 36 (*)    GFR calc Af Amer 41 (*)    All other components within normal limits  CBC WITH DIFFERENTIAL/PLATELET - Abnormal; Notable for the following:    RBC 3.56 (*)    Hemoglobin 11.2 (*)    HCT 32.6 (*)    All other components within normal limits  Laboratory work was reviewed and showed no clinically significant abnormalities.   EKG ED ECG REPORT I, Daymon Larsen, the attending physician, personally viewed and interpreted this ECG.  Date: 06/22/2016 EKG Time: 1941 Rate: 63 Rhythm: normal sinus rhythm QRS Axis: normal Intervals: normal ST/T Wave abnormalities: Prolonged QT interval Conduction Disturbances: none Narrative Interpretation: unremarkable Poor quality EKG No acute ischemic changes   Radiology:  "Ct Head Wo Contrast  Result Date: 06/22/2016 CLINICAL DATA:  Witnessed fall.  Confusion. EXAM: CT HEAD WITHOUT CONTRAST TECHNIQUE: Contiguous axial images were obtained from the base of the skull through the vertex without intravenous contrast. COMPARISON:  CT head 05/20/2016. FINDINGS: Brain: No acute stroke, acute hemorrhage, mass lesion, or extra-axial fluid. Global atrophy. Hydrocephalus ex vacuo. Extensive chronic microvascular ischemic change throughout the white matter. Vascular: Extensive vascular calcification particularly in the distal basilar. No signs of large vessel occlusion. Skull:  Calvarium intact.  No significant scalp hematoma. Sinuses/Orbits: No acute finding.  RIGHT cataract extraction. Other: None. Compared with priors, similar appearance. IMPRESSION: Atrophy and small vessel disease.  No acute intracranial findings. Electronically Signed   By: Staci Righter M.D.   On: 06/22/2016 20:22  " I personally reviewed the radiologic studies   ED Course:  Patient's stay here was uneventful was initially hypotensive when he arrived others no evidence. Arrival for syncope. Patient was given a 500 mL fluid bolus and had correction of his blood pressure to within normal limits and certainly is asymptomatic. His head CT was normal and I cannot find any other acute injuries. The son agrees with outpatient management and agrees take the patient back to [facility]. He should continue his current medications. Clinical Course     Assessment:  Status post non-syncopal fall      Plan:  Outpatient Patient was advised to return immediately if condition worsens. Patient was advised to follow up with their primary care physician or other specialized physicians involved in their outpatient care. The patient and/or family member/power of attorney had laboratory results reviewed at the bedside. All questions and concerns were addressed and appropriate discharge instructions were distributed by the nursing staff.             Daymon Larsen, MD 06/22/16 2119

## 2016-06-22 NOTE — ED Triage Notes (Signed)
Pt arrived from [facility] after a witnessed fall this evening. Pt denies pain at this time and denies hitting head tonight. Pt has bruising noted to the back of his head but bruising does not appear to be from today. Pt reports having hit head 1 month ago when he last feel. Pt is confused upon arrival but per paperwork from [facility] this is pts baseline. Pt is pleasant and alert. No shortening of legs noted and no rotation noted at this time.

## 2016-06-22 NOTE — ED Notes (Signed)
Pt. Son taking pt. Back to [facility].

## 2016-12-06 ENCOUNTER — Emergency Department: Payer: Medicare Other

## 2016-12-06 ENCOUNTER — Telehealth: Payer: Self-pay | Admitting: Cardiovascular Disease

## 2016-12-06 ENCOUNTER — Inpatient Hospital Stay
Admission: EM | Admit: 2016-12-06 | Discharge: 2016-12-10 | DRG: 193 | Disposition: A | Discharge status: Skilled Nursing Facility | Payer: Medicare Other | Attending: Internal Medicine | Admitting: Internal Medicine

## 2016-12-06 ENCOUNTER — Encounter: Payer: Self-pay | Admitting: Emergency Medicine

## 2016-12-06 DIAGNOSIS — Z8249 Family history of ischemic heart disease and other diseases of the circulatory system: Secondary | ICD-10-CM

## 2016-12-06 DIAGNOSIS — D72819 Decreased white blood cell count, unspecified: Secondary | ICD-10-CM | POA: Diagnosis not present

## 2016-12-06 DIAGNOSIS — D696 Thrombocytopenia, unspecified: Secondary | ICD-10-CM | POA: Diagnosis not present

## 2016-12-06 DIAGNOSIS — Z79899 Other long term (current) drug therapy: Secondary | ICD-10-CM | POA: Diagnosis not present

## 2016-12-06 DIAGNOSIS — R0902 Hypoxemia: Secondary | ICD-10-CM

## 2016-12-06 DIAGNOSIS — I429 Cardiomyopathy, unspecified: Secondary | ICD-10-CM | POA: Diagnosis not present

## 2016-12-06 DIAGNOSIS — J9601 Acute respiratory failure with hypoxia: Secondary | ICD-10-CM | POA: Diagnosis not present

## 2016-12-06 DIAGNOSIS — Z9079 Acquired absence of other genital organ(s): Secondary | ICD-10-CM | POA: Diagnosis not present

## 2016-12-06 DIAGNOSIS — I10 Essential (primary) hypertension: Secondary | ICD-10-CM | POA: Diagnosis not present

## 2016-12-06 DIAGNOSIS — I2510 Atherosclerotic heart disease of native coronary artery without angina pectoris: Secondary | ICD-10-CM | POA: Diagnosis not present

## 2016-12-06 DIAGNOSIS — F0390 Unspecified dementia without behavioral disturbance: Secondary | ICD-10-CM | POA: Diagnosis not present

## 2016-12-06 DIAGNOSIS — W19XXXA Unspecified fall, initial encounter: Secondary | ICD-10-CM | POA: Diagnosis not present

## 2016-12-06 DIAGNOSIS — Z951 Presence of aortocoronary bypass graft: Secondary | ICD-10-CM

## 2016-12-06 DIAGNOSIS — I959 Hypotension, unspecified: Secondary | ICD-10-CM | POA: Diagnosis present

## 2016-12-06 DIAGNOSIS — F329 Major depressive disorder, single episode, unspecified: Secondary | ICD-10-CM | POA: Diagnosis present

## 2016-12-06 DIAGNOSIS — Z9889 Other specified postprocedural states: Secondary | ICD-10-CM | POA: Diagnosis not present

## 2016-12-06 DIAGNOSIS — J189 Pneumonia, unspecified organism: Principal | ICD-10-CM | POA: Diagnosis present

## 2016-12-06 DIAGNOSIS — N390 Urinary tract infection, site not specified: Secondary | ICD-10-CM | POA: Diagnosis not present

## 2016-12-06 DIAGNOSIS — B961 Klebsiella pneumoniae [K. pneumoniae] as the cause of diseases classified elsewhere: Secondary | ICD-10-CM | POA: Diagnosis present

## 2016-12-06 DIAGNOSIS — E039 Hypothyroidism, unspecified: Secondary | ICD-10-CM | POA: Diagnosis not present

## 2016-12-06 DIAGNOSIS — Z66 Do not resuscitate: Secondary | ICD-10-CM | POA: Diagnosis present

## 2016-12-06 DIAGNOSIS — N400 Enlarged prostate without lower urinary tract symptoms: Secondary | ICD-10-CM | POA: Diagnosis not present

## 2016-12-06 DIAGNOSIS — I480 Paroxysmal atrial fibrillation: Secondary | ICD-10-CM | POA: Diagnosis present

## 2016-12-06 DIAGNOSIS — S0181XA Laceration without foreign body of other part of head, initial encounter: Secondary | ICD-10-CM | POA: Diagnosis present

## 2016-12-06 DIAGNOSIS — Z8546 Personal history of malignant neoplasm of prostate: Secondary | ICD-10-CM | POA: Diagnosis not present

## 2016-12-06 DIAGNOSIS — Z809 Family history of malignant neoplasm, unspecified: Secondary | ICD-10-CM | POA: Diagnosis not present

## 2016-12-06 DIAGNOSIS — E876 Hypokalemia: Secondary | ICD-10-CM | POA: Diagnosis present

## 2016-12-06 DIAGNOSIS — E785 Hyperlipidemia, unspecified: Secondary | ICD-10-CM | POA: Diagnosis present

## 2016-12-06 DIAGNOSIS — R296 Repeated falls: Secondary | ICD-10-CM | POA: Diagnosis present

## 2016-12-06 LAB — GASTROINTESTINAL PANEL BY PCR, STOOL (REPLACES STOOL CULTURE)
Adenovirus F40/41: NOT DETECTED
Astrovirus: NOT DETECTED
CAMPYLOBACTER SPECIES: NOT DETECTED
Cryptosporidium: NOT DETECTED
Cyclospora cayetanensis: NOT DETECTED
ENTAMOEBA HISTOLYTICA: NOT DETECTED
ENTEROPATHOGENIC E COLI (EPEC): NOT DETECTED
Enteroaggregative E coli (EAEC): NOT DETECTED
Enterotoxigenic E coli (ETEC): NOT DETECTED
Giardia lamblia: NOT DETECTED
NOROVIRUS GI/GII: NOT DETECTED
PLESIMONAS SHIGELLOIDES: NOT DETECTED
Rotavirus A: NOT DETECTED
SAPOVIRUS (I, II, IV, AND V): NOT DETECTED
SHIGA LIKE TOXIN PRODUCING E COLI (STEC): NOT DETECTED
Salmonella species: NOT DETECTED
Shigella/Enteroinvasive E coli (EIEC): NOT DETECTED
VIBRIO CHOLERAE: NOT DETECTED
Vibrio species: NOT DETECTED
Yersinia enterocolitica: NOT DETECTED

## 2016-12-06 LAB — COMPREHENSIVE METABOLIC PANEL
ALT: 19 U/L (ref 17–63)
ANION GAP: 8 (ref 5–15)
AST: 33 U/L (ref 15–41)
Albumin: 3.3 g/dL — ABNORMAL LOW (ref 3.5–5.0)
Alkaline Phosphatase: 54 U/L (ref 38–126)
BUN: 24 mg/dL — ABNORMAL HIGH (ref 6–20)
CALCIUM: 8.6 mg/dL — AB (ref 8.9–10.3)
CO2: 26 mmol/L (ref 22–32)
Chloride: 107 mmol/L (ref 101–111)
Creatinine, Ser: 1.07 mg/dL (ref 0.61–1.24)
GFR calc non Af Amer: 59 mL/min — ABNORMAL LOW (ref >60)
Glucose, Bld: 96 mg/dL (ref 65–99)
POTASSIUM: 3.5 mmol/L (ref 3.5–5.1)
SODIUM: 141 mmol/L (ref 135–145)
Total Bilirubin: 0.6 mg/dL (ref 0.3–1.2)
Total Protein: 6.4 g/dL — ABNORMAL LOW (ref 6.5–8.1)

## 2016-12-06 LAB — CBC WITH DIFFERENTIAL/PLATELET
Basophils Absolute: 0 10*3/uL (ref 0–0.1)
Basophils Relative: 1 %
EOS ABS: 0 10*3/uL (ref 0–0.7)
EOS PCT: 0 %
HCT: 34.7 % — ABNORMAL LOW (ref 40.0–52.0)
Hemoglobin: 11.7 g/dL — ABNORMAL LOW (ref 13.0–18.0)
LYMPHS ABS: 0.8 10*3/uL — AB (ref 1.0–3.6)
Lymphocytes Relative: 23 %
MCH: 30.7 pg (ref 26.0–34.0)
MCHC: 33.6 g/dL (ref 32.0–36.0)
MCV: 91.4 fL (ref 80.0–100.0)
Monocytes Absolute: 0.4 10*3/uL (ref 0.2–1.0)
Monocytes Relative: 10 %
Neutro Abs: 2.4 10*3/uL (ref 1.4–6.5)
Neutrophils Relative %: 66 %
Platelets: 111 10*3/uL — ABNORMAL LOW (ref 150–440)
RBC: 3.8 MIL/uL — ABNORMAL LOW (ref 4.40–5.90)
RDW: 14.7 % — ABNORMAL HIGH (ref 11.5–14.5)
WBC: 3.7 10*3/uL — AB (ref 3.8–10.6)

## 2016-12-06 LAB — C DIFFICILE QUICK SCREEN W PCR REFLEX
C Diff antigen: NEGATIVE
C Diff interpretation: NOT DETECTED
C Diff toxin: NEGATIVE

## 2016-12-06 LAB — URINALYSIS, COMPLETE (UACMP) WITH MICROSCOPIC
Bilirubin Urine: NEGATIVE
Glucose, UA: NEGATIVE mg/dL
Ketones, ur: NEGATIVE mg/dL
NITRITE: POSITIVE — AB
PROTEIN: NEGATIVE mg/dL
SQUAMOUS EPITHELIAL / LPF: NONE SEEN
Specific Gravity, Urine: 1.014 (ref 1.005–1.030)
pH: 6 (ref 5.0–8.0)

## 2016-12-06 LAB — VALPROIC ACID LEVEL: Valproic Acid Lvl: 25 ug/mL — ABNORMAL LOW (ref 50.0–100.0)

## 2016-12-06 LAB — MRSA PCR SCREENING: MRSA by PCR: NEGATIVE

## 2016-12-06 LAB — FIBRIN DERIVATIVES D-DIMER (ARMC ONLY): FIBRIN DERIVATIVES D-DIMER (ARMC): 1204.36 — AB (ref 0.00–499.00)

## 2016-12-06 LAB — BRAIN NATRIURETIC PEPTIDE: B NATRIURETIC PEPTIDE 5: 338 pg/mL — AB (ref 0.0–100.0)

## 2016-12-06 LAB — TROPONIN I: Troponin I: 0.03 ng/mL (ref <0.03)

## 2016-12-06 MED ORDER — SODIUM CHLORIDE 0.9 % IV BOLUS (SEPSIS)
500.0000 mL | Freq: Once | INTRAVENOUS | Status: AC
  Administered 2016-12-06: 500 mL via INTRAVENOUS

## 2016-12-06 MED ORDER — ADULT MULTIVITAMIN W/MINERALS CH
1.0000 | ORAL_TABLET | Freq: Every day | ORAL | Status: DC
  Administered 2016-12-07 – 2016-12-10 (×4): 1 via ORAL
  Filled 2016-12-06 (×4): qty 1

## 2016-12-06 MED ORDER — DEXTROSE 5 % IV SOLN
1.0000 g | INTRAVENOUS | Status: DC
  Administered 2016-12-06 – 2016-12-07 (×2): 1 g via INTRAVENOUS
  Filled 2016-12-06 (×3): qty 10

## 2016-12-06 MED ORDER — FERROUS SULFATE 325 (65 FE) MG PO TABS
325.0000 mg | ORAL_TABLET | Freq: Every day | ORAL | Status: DC
  Administered 2016-12-07 – 2016-12-10 (×4): 325 mg via ORAL
  Filled 2016-12-06 (×4): qty 1

## 2016-12-06 MED ORDER — FINASTERIDE 5 MG PO TABS
5.0000 mg | ORAL_TABLET | Freq: Every day | ORAL | Status: DC
  Administered 2016-12-06 – 2016-12-10 (×5): 5 mg via ORAL
  Filled 2016-12-06 (×5): qty 1

## 2016-12-06 MED ORDER — DIVALPROEX SODIUM 125 MG PO CSDR
250.0000 mg | DELAYED_RELEASE_CAPSULE | Freq: Two times a day (BID) | ORAL | Status: DC
  Administered 2016-12-06 – 2016-12-10 (×7): 250 mg via ORAL
  Filled 2016-12-06 (×9): qty 2

## 2016-12-06 MED ORDER — PANTOPRAZOLE SODIUM 40 MG PO TBEC
40.0000 mg | DELAYED_RELEASE_TABLET | Freq: Every day | ORAL | Status: DC
  Administered 2016-12-07 – 2016-12-10 (×4): 40 mg via ORAL
  Filled 2016-12-06 (×4): qty 1

## 2016-12-06 MED ORDER — LEVOTHYROXINE SODIUM 50 MCG PO TABS
50.0000 ug | ORAL_TABLET | Freq: Every day | ORAL | Status: DC
  Administered 2016-12-06 – 2016-12-10 (×4): 50 ug via ORAL
  Filled 2016-12-06 (×5): qty 1

## 2016-12-06 MED ORDER — ROSUVASTATIN CALCIUM 5 MG PO TABS
5.0000 mg | ORAL_TABLET | Freq: Every day | ORAL | Status: DC
  Administered 2016-12-06 – 2016-12-10 (×5): 5 mg via ORAL
  Filled 2016-12-06 (×7): qty 1

## 2016-12-06 MED ORDER — IOPAMIDOL (ISOVUE-370) INJECTION 76%
75.0000 mL | Freq: Once | INTRAVENOUS | Status: AC | PRN
  Administered 2016-12-06: 75 mL via INTRAVENOUS

## 2016-12-06 MED ORDER — ALPRAZOLAM 0.5 MG PO TABS
0.2500 mg | ORAL_TABLET | Freq: Three times a day (TID) | ORAL | Status: DC | PRN
  Administered 2016-12-06 – 2016-12-08 (×2): 0.25 mg via ORAL
  Filled 2016-12-06 (×2): qty 1

## 2016-12-06 MED ORDER — ACETAMINOPHEN 325 MG PO TABS: 650.0000 mg | ORAL_TABLET | Freq: Four times a day (QID) | ORAL | Status: DC | PRN

## 2016-12-06 MED ORDER — AMIODARONE HCL 200 MG PO TABS
100.0000 mg | ORAL_TABLET | Freq: Every day | ORAL | Status: DC
  Administered 2016-12-07 – 2016-12-10 (×4): 100 mg via ORAL
  Filled 2016-12-06 (×5): qty 1

## 2016-12-06 MED ORDER — ALBUTEROL SULFATE (2.5 MG/3ML) 0.083% IN NEBU: 2.5000 mg | INHALATION_SOLUTION | Freq: Four times a day (QID) | RESPIRATORY_TRACT | Status: DC | PRN

## 2016-12-06 MED ORDER — BACITRACIN 500 UNIT/GM EX OINT
1.0000 "application " | TOPICAL_OINTMENT | CUTANEOUS | Status: DC
  Administered 2016-12-07 – 2016-12-10 (×3): 1 via TOPICAL
  Filled 2016-12-06 (×5): qty 0.9

## 2016-12-06 MED ORDER — BICALUTAMIDE 50 MG PO TABS
50.0000 mg | ORAL_TABLET | Freq: Every day | ORAL | Status: DC
  Administered 2016-12-07 – 2016-12-10 (×4): 50 mg via ORAL
  Filled 2016-12-06 (×4): qty 1

## 2016-12-06 MED ORDER — DONEPEZIL HCL 5 MG PO TABS
10.0000 mg | ORAL_TABLET | Freq: Every evening | ORAL | Status: DC
  Administered 2016-12-06 – 2016-12-10 (×5): 10 mg via ORAL
  Filled 2016-12-06 (×5): qty 2

## 2016-12-06 MED ORDER — MELATONIN 5 MG PO TABS
5.0000 mg | ORAL_TABLET | Freq: Every day | ORAL | Status: DC
  Administered 2016-12-06 – 2016-12-09 (×4): 5 mg via ORAL
  Filled 2016-12-06 (×5): qty 1

## 2016-12-06 MED ORDER — ACETAMINOPHEN 650 MG RE SUPP: 650.0000 mg | Freq: Four times a day (QID) | RECTAL | Status: DC | PRN

## 2016-12-06 MED ORDER — DEXTROSE 5 % IV SOLN: 1.0000 g | INTRAVENOUS | Status: DC

## 2016-12-06 MED ORDER — TAMSULOSIN HCL 0.4 MG PO CAPS
0.4000 mg | ORAL_CAPSULE | Freq: Every day | ORAL | Status: DC
  Administered 2016-12-06 – 2016-12-10 (×5): 0.4 mg via ORAL
  Filled 2016-12-06 (×5): qty 1

## 2016-12-06 MED ORDER — SERTRALINE HCL 50 MG PO TABS
50.0000 mg | ORAL_TABLET | Freq: Every day | ORAL | Status: DC
  Administered 2016-12-06 – 2016-12-09 (×4): 50 mg via ORAL
  Filled 2016-12-06 (×4): qty 1

## 2016-12-06 MED ORDER — CALCIUM CARBONATE ANTACID 500 MG PO CHEW
1.0000 | CHEWABLE_TABLET | Freq: Every day | ORAL | Status: DC
  Administered 2016-12-07 – 2016-12-10 (×4): 200 mg via ORAL
  Filled 2016-12-06 (×4): qty 1

## 2016-12-06 NOTE — Telephone Encounter (Signed)
Pt currently in ED. He has note been seen in our office since May 2017.

## 2016-12-06 NOTE — ED Notes (Signed)
Patient decreased in saturation after standing him up and moving him in the bed for orthostatic vitals.  Patient placed on 2 L temporarily to increase saturation levels again.  MD notified.

## 2016-12-06 NOTE — ED Notes (Signed)
Patient given refreshments and tolerating it well at this time.

## 2016-12-06 NOTE — Progress Notes (Signed)
Patient ID: Adam Nielsen, male   DOB: 09/14/1925, 83 y.o.   MRN: [id number] PCP note Patient and son present. Patient not able to participate in this part of the conversation.  Patient with dementia and poor quality of life, frequent falls. Today couldn't even stand in the ER. Patient may end up needing higher level of care than the assisted living can provide. We'll get social worker involved to look into other options.  DO NOT RESUSCITATE status ordered  Time spent on ACP discussion 17 minutes  Dr. Loletha Grayer

## 2016-12-06 NOTE — H&P (Signed)
[facility] NAME: Adam Nielsen    MR#:  [id number]  DATE OF BIRTH:  07/11/1925  DATE OF ADMISSION:  12/06/2016  PRIMARY CARE PHYSICIAN: Sarajane Jews, MD   REQUESTING/REFERRING PHYSICIAN: Dr Deborha Payment  CHIEF COMPLAINT:   Chief Complaint  Patient presents with  . Fall    HISTORY OF PRESENT ILLNESS:  Adam Nielsen  is a 83 y.o. male presents with an unwitnessed fall and having a laceration over the left eye. The son who is at the bedside was called around 8 AM that he had an unwitnessed fall and had a gash over his forehead. He states that the doctor said he had recent low blood pressure. Looks like he is also been on Macrobid for recent urinary tract infection. He's been at [facility] assisted living for over one year. He's had numerous ER visits for falls. The son is at the bedside said he hasn't been as responsive in the ER as he normally is. Normally he is able to answer questions. I was able to ask him some questions and get him to move all of his extremities and follows some commands.  PAST MEDICAL HISTORY:   Past Medical History:  Diagnosis Date  . Acute bronchitis 11/22/2015   . BPH (benign prostatic hypertrophy)   . CAD (coronary artery disease)    a. s/p CABG approx 1997; b. patient of Dr. Wynonia Lawman, MD  . Chronic indwelling Foley catheter   . Dementia   . Dementia   . Fall    11/22/2015    . Hyperlipidemia   . Hypothyroid   . PAF (paroxysmal atrial fibrillation) (HCC)    a. not on long term full dose anticoagulation  . Prostate CA ([place])   . Urinary tract infection 11/22/2015     PAST SURGICAL HISTORY:   Past Surgical History:  Procedure Laterality Date  . ANKLE SURGERY    . BONE GRAFT HIP ILIAC CREST    . CORONARY ARTERY BYPASS GRAFT    . TRANSURETHRAL RESECTION OF PROSTATE N/A 12/12/2015   Procedure: TRANSURETHRAL RESECTION OF THE PROSTATE ;  Surgeon: Kathie Rhodes, MD;  Location: [facility];  Service:  Urology;  Laterality: N/A;    SOCIAL HISTORY:   Social History  Substance Use Topics  . Smoking status: Former Research scientist (life sciences)  . Smokeless tobacco: Never Used  . Alcohol use No    FAMILY HISTORY:   Family History  Problem Relation Age of Onset  . Heart disease Father   . Cancer Brother     DRUG ALLERGIES:  No Known Allergies  REVIEW OF SYSTEMS:  Very limited with dementia  RESPIRATORY: As per son he has a cough CARDIOVASCULAR: No chest pain GASTROINTESTINAL: No abdominal pain. Musculoskeletal: No joint pain  MEDICATIONS AT HOME:   Prior to Admission medications   Medication Sig Start Date End Date Taking? Authorizing Provider  acetaminophen (TYLENOL) 500 MG tablet Take 500 mg by mouth every 4 (four) hours as needed for mild pain or moderate pain.    Yes Historical Provider, MD  albuterol (PROVENTIL HFA;VENTOLIN HFA) 108 (90 Base) MCG/ACT inhaler Inhale 2 puffs into the lungs every 6 (six) hours as needed for wheezing or shortness of breath. 11/24/15  Yes Kemyra August Leslye Peer, MD  ALPRAZolam Duanne Moron) 0.25 MG tablet Take 0.25 mg by mouth daily. At 12 pm   Yes Historical Provider, MD  ALPRAZolam (XANAX) 0.25 MG tablet Take 0.25 mg by mouth 3 (three) times daily as  needed. For agitation   Yes Historical Provider, MD  amiodarone (PACERONE) 200 MG tablet Take 100 mg by mouth daily.   Yes Historical Provider, MD  bacitracin 500 UNIT/GM ointment Apply 1 application topically every morning. To wound on upper arm and forearm   Yes Historical Provider, MD  bicalutamide (CASODEX) 50 MG tablet Take 50 mg by mouth daily. Reported on 12/13/2015   Yes Historical Provider, MD  Calcium Carbonate-Vit D-Min (CALTRATE 600+D PLUS MINERALS) 600-800 MG-UNIT CHEW Chew 2 tablets by mouth daily.   Yes Historical Provider, MD  divalproex (DEPAKOTE SPRINKLE) 125 MG capsule Take 250 mg by mouth 2 (two) times daily.   Yes Historical Provider, MD  donepezil (ARICEPT) 10 MG tablet Take 10 mg by mouth every evening.    Yes  Historical Provider, MD  ferrous sulfate 325 (65 FE) MG tablet Take 1 tablet (325 mg total) by mouth 2 (two) times daily with a meal. Patient taking differently: Take 325 mg by mouth daily.  03/22/16  Yes Srikar Sudini, MD  finasteride (PROSCAR) 5 MG tablet Take 5 mg by mouth daily.   Yes Historical Provider, MD  furosemide (LASIX) 20 MG tablet Take 1 tablet (20 mg total) by mouth daily. 12/22/15  Yes Vaughan Basta, MD  guaifenesin (ROBITUSSIN) 100 MG/5ML syrup Take 200 mg by mouth every 6 (six) hours as needed for cough.   Yes Historical Provider, MD  levothyroxine (SYNTHROID, LEVOTHROID) 50 MCG tablet Take 50 mcg by mouth daily before breakfast.   Yes Historical Provider, MD  lisinopril (PRINIVIL,ZESTRIL) 2.5 MG tablet Take 1 tablet (2.5 mg total) by mouth daily. 12/21/15  Yes Vaughan Basta, MD  magnesium hydroxide (MILK OF MAGNESIA) 400 MG/5ML suspension Take 30 mLs by mouth at bedtime as needed for mild constipation.   Yes Historical Provider, MD  Melatonin 5 MG TABS Take 5 mg by mouth at bedtime.   Yes Historical Provider, MD  Menthol-Zinc Oxide (RISAMINE) 0.44-20.625 % OINT Apply 1 application topically 2 (two) times daily as needed. For rash   Yes Historical Provider, MD  Multiple Vitamin (MULTIVITAMIN WITH MINERALS) TABS tablet Take 1 tablet by mouth daily.   Yes Historical Provider, MD  nitrofurantoin (MACRODANTIN) 100 MG capsule Take 100 mg by mouth 2 (two) times daily. For 5 days 12/05/16 12/10/16 Yes Historical Provider, MD  Omega-3 Fatty Acids (FISH OIL) 1000 MG CAPS Take 1,000 mg by mouth daily.   Yes Historical Provider, MD  pantoprazole (PROTONIX) 40 MG tablet Take 1 tablet (40 mg total) by mouth daily. 03/22/16  Yes Srikar Sudini, MD  rosuvastatin (CRESTOR) 5 MG tablet Take 1 tablet (5 mg total) by mouth daily at 6 PM. 12/21/15  Yes Vaughan Basta, MD  sertraline (ZOLOFT) 50 MG tablet Take 50 mg by mouth at bedtime.   Yes Historical Provider, MD  tamsulosin (FLOMAX) 0.4  MG CAPS capsule Take 1 capsule (0.4 mg total) by mouth daily after supper. 12/21/15  Yes Vaughan Basta, MD      VITAL SIGNS:  Blood pressure 117/75, pulse 83, temperature 97.8 F (36.6 C), temperature source Axillary, resp. rate 15, weight 68 kg (150 lb), SpO2 100 %.  PHYSICAL EXAMINATION:  GENERAL:  83 y.o.-year-old patient lying in the bed with no acute distress.  EYES: Pupils equal, round, reactive to light and accommodation. No scleral icterus.  HEENT: Head laceration over the left eye. Oropharynx and nasopharynx clear.  NECK:  Supple, no jugular venous distention. No thyroid enlargement, no tenderness.  LUNGS: Normal breath sounds bilaterally,  no wheezing, rales,rhonchi or crepitation. No use of accessory muscles of respiration.  CARDIOVASCULAR: S1, S2 irregularly irregular. No murmurs, rubs, or gallops.  ABDOMEN: Soft, nontender, nondistended. Bowel sounds present. No organomegaly or mass.  EXTREMITIES: No pedal edema, cyanosis, or clubbing.  NEUROLOGIC: Cranial nerves II through XII are intact. Muscle strength 5/5 in all extremities. Sensation intact. Gait not checked.  PSYCHIATRIC: The patient is alert and follows commands.  SKIN: Laceration over the left eye, laceration left knee. Scraping over the left arm and hand  LABORATORY PANEL:   CBC  Recent Labs Lab 12/06/16 1234  WBC 3.7*  HGB 11.7*  HCT 34.7*  PLT 111*   ------------------------------------------------------------------------------------------------------------------  Chemistries   Recent Labs Lab 12/06/16 1234  NA 141  K 3.5  CL 107  CO2 26  GLUCOSE 96  BUN 24*  CREATININE 1.07  CALCIUM 8.6*  AST 33  ALT 19  ALKPHOS 54  BILITOT 0.6   ------------------------------------------------------------------------------------------------------------------  Cardiac Enzymes  Recent Labs Lab 12/06/16 1234  TROPONINI 0.03*    ------------------------------------------------------------------------------------------------------------------  RADIOLOGY:  Dg Chest 2 View  Result Date: 12/06/2016 CLINICAL DATA:  Hypoxia. EXAM: CHEST  2 VIEW COMPARISON:  Radiographs of November 22, 2015. FINDINGS: Stable cardiomediastinal silhouette. Status post coronary artery bypass graft. No pneumothorax or pleural effusion is noted. No acute pulmonary disease is noted. Bony thorax is unremarkable. IMPRESSION: No active cardiopulmonary disease. Electronically Signed   By: Marijo Conception, M.D.   On: 12/06/2016 12:27   Dg Knee 2 Views Left  Result Date: 12/06/2016 CLINICAL DATA:  Fall, abrasions to left knee EXAM: LEFT KNEE - 1-2 VIEW COMPARISON:  None. FINDINGS: No acute bony abnormality. Specifically, no fracture, subluxation, or dislocation. Soft tissues are intact. Joint spaces maintained. No joint effusion. IMPRESSION: No acute bony abnormality. Electronically Signed   By: Rolm Baptise M.D.   On: 12/06/2016 09:28   Ct Head Wo Contrast  Result Date: 12/06/2016 CLINICAL DATA:  Unwitnessed fall, headaches and neck pain, initial encounter EXAM: CT HEAD WITHOUT CONTRAST CT CERVICAL SPINE WITHOUT CONTRAST TECHNIQUE: Multidetector CT imaging of the head and cervical spine was performed following the standard protocol without intravenous contrast. Multiplanar CT image reconstructions of the cervical spine were also generated. COMPARISON:  06/22/2016, 04/27/2016 FINDINGS: CT HEAD FINDINGS Brain: Scattered areas of decreased attenuation are noted within the cerebellum bilaterally consistent with prior infarcts. Chronic atrophic and ischemic changes are seen. No findings to suggest acute hemorrhage, acute infarction or space-occupying mass lesion are noted. Vascular: No hyperdense vessel or unexpected calcification. Skull: No acute bony abnormality is same. Sinuses/Orbits: Mild mucosal thickening is noted within the maxillary antra bilaterally. Other:  None CT CERVICAL SPINE FINDINGS Alignment: No alignment abnormalities are noted. Skull base and vertebrae: Seven cervical segments are well visualized. Vertebral body height is well maintained. Facet hypertrophic changes are noted without evidence of acute fracture or acute facet abnormality. Disc space narrowing is noted at multiple levels with associated osteophytes. These changes are similar to that seen on prior exam. Stable sclerotic foci are noted within C2, C6 and T2. This again may a represent sequela from the patient's known prostate carcinoma. Soft tissues and spinal canal: No acute soft tissue abnormality is noted. Bilateral carotid calcifications are seen. Disc levels: Mild disc bulging is noted at C4-5, C5-6 and C6-7 associated with the osteophytic change. Upper chest: Within normal limits. IMPRESSION: CT of the head: Chronic atrophic and ischemic changes. No acute intracranial abnormality noted. CT of the cervical spine: Multilevel degenerative  change stable from the prior exam. Sclerotic changes within the cervical spine stable from the previous exam which may be related to the patient's known prostate carcinoma. No acute abnormality noted. Electronically Signed   By: Inez Catalina M.D.   On: 12/06/2016 09:53   Ct Angio Chest Pe W Or Wo Contrast  Result Date: 12/06/2016 CLINICAL DATA:  Hypoxia. EXAM: CT ANGIOGRAPHY CHEST WITH CONTRAST TECHNIQUE: Multidetector CT imaging of the chest was performed using the standard protocol during bolus administration of intravenous contrast. Multiplanar CT image reconstructions and MIPs were obtained to evaluate the vascular anatomy. CONTRAST:  75 mL of Isovue 370 intravenously. COMPARISON:  CT scan of December 23, 2014. FINDINGS: Cardiovascular: There is no definite evidence of pulmonary embolus. Atherosclerosis of thoracic aorta is noted without aneurysm. Status post coronary artery bypass graft. No pericardial effusion is noted. Mediastinum/Nodes: No enlarged  mediastinal, hilar, or axillary lymph nodes. Thyroid gland, trachea, and esophagus demonstrate no significant findings. Lungs/Pleura: Lungs are clear. No pleural effusion or pneumothorax. Upper Abdomen: No acute abnormality. Musculoskeletal: No chest wall abnormality. No acute or significant osseous findings. Review of the MIP images confirms the above findings. IMPRESSION: Aortic atherosclerosis. No definite evidence of pulmonary embolus. No acute abnormality seen in the chest. Electronically Signed   By: Marijo Conception, M.D.   On: 12/06/2016 14:50   Ct Cervical Spine Wo Contrast  Result Date: 12/06/2016 CLINICAL DATA:  Unwitnessed fall, headaches and neck pain, initial encounter EXAM: CT HEAD WITHOUT CONTRAST CT CERVICAL SPINE WITHOUT CONTRAST TECHNIQUE: Multidetector CT imaging of the head and cervical spine was performed following the standard protocol without intravenous contrast. Multiplanar CT image reconstructions of the cervical spine were also generated. COMPARISON:  06/22/2016, 04/27/2016 FINDINGS: CT HEAD FINDINGS Brain: Scattered areas of decreased attenuation are noted within the cerebellum bilaterally consistent with prior infarcts. Chronic atrophic and ischemic changes are seen. No findings to suggest acute hemorrhage, acute infarction or space-occupying mass lesion are noted. Vascular: No hyperdense vessel or unexpected calcification. Skull: No acute bony abnormality is same. Sinuses/Orbits: Mild mucosal thickening is noted within the maxillary antra bilaterally. Other: None CT CERVICAL SPINE FINDINGS Alignment: No alignment abnormalities are noted. Skull base and vertebrae: Seven cervical segments are well visualized. Vertebral body height is well maintained. Facet hypertrophic changes are noted without evidence of acute fracture or acute facet abnormality. Disc space narrowing is noted at multiple levels with associated osteophytes. These changes are similar to that seen on prior exam. Stable  sclerotic foci are noted within C2, C6 and T2. This again may a represent sequela from the patient's known prostate carcinoma. Soft tissues and spinal canal: No acute soft tissue abnormality is noted. Bilateral carotid calcifications are seen. Disc levels: Mild disc bulging is noted at C4-5, C5-6 and C6-7 associated with the osteophytic change. Upper chest: Within normal limits. IMPRESSION: CT of the head: Chronic atrophic and ischemic changes. No acute intracranial abnormality noted. CT of the cervical spine: Multilevel degenerative change stable from the prior exam. Sclerotic changes within the cervical spine stable from the previous exam which may be related to the patient's known prostate carcinoma. No acute abnormality noted. Electronically Signed   By: Inez Catalina M.D.   On: 12/06/2016 09:53    EKG:   Atrial fibrillation 96 bpm  IMPRESSION AND PLAN:   1. Acute respiratory failure with hypoxia. Pulse ox 76% on room air. Oxygen supplementation given with good results. CT scan of the chest negative for pulmonary embolism, pneumonia or fluid.  2. Unwitnessed fall. Patient has quite a few unwitnessed falls recently. We'll get social worker involved to see if the patient needs higher level care. Also get physical therapy involved. 3. Recent treatment with Macrobid for UTI. I told the nurse in the ER that I need a urine analysis and urine culture. 4. Relative hypotension -Fluid bolus. Hold Lasix and lisinopril 5. Atrial fibrillation with cardiomyopathy continue amiodarone. With all the falls no anticoagulation 6. Dementia. Continue psychiatric medications and dementia medications 7. History of coronary artery disease. 8. BPH. No change in medications at this point 9. Hyperlipidemia unspecified on Crestor 10. Thrombocytopenia and leukopenia. Continue to monitor with CBC  All the records are reviewed and case discussed with ED provider. Management plans discussed with the patient, family and they  are in agreement.  CODE STATUS: DO NOT RESUSCITATE  TOTAL TIME TAKING CARE OF THIS PATIENT: 50 minutes.    Loletha Grayer M.D on 12/06/2016 at 4:01 PM  Between 7am to 6pm - Pager - [phone]  After 6pm call admission pager [phone]  [facility]  [phone]  CC: Primary care physician; Sarajane Jews, MD

## 2016-12-06 NOTE — ED Notes (Signed)
Patient to be ambulated with walker and to have orthostatic BP's done. To be discharged afterwards if able to tolerate. Awaiting second staff member to attempt ambulation d/t recent BP drops per son and patient's frequent falls.

## 2016-12-06 NOTE — Telephone Encounter (Signed)
Pt son called to schedule pt 6 month f/u. He states Adam Nielsen house Calls sees his father.Adam [facility] house calls is concerned with pt BP being too low, he does not know the reading. The nurse thinks this is coming from his amiodarone. The nurse with the assisted living called this morning states pt fell out of his bed and had a cut on his eye, pt is in route to [facility] to get checked out. Son wonders if this fall is in connection to his low BP.  Pt is scheduled for 4/10 with  Adam Nielsen.

## 2016-12-06 NOTE — ED Provider Notes (Signed)
Time Seen: Approximately 0922  I have reviewed the triage notes  Chief Complaint: Fall   History of Present Illness: Adam Nielsen is a 83 y.o. male  who is brought here by EMS from local nursing facility for evaluation of an unwitnessed fall. Patient does have a past history of frequent falls. Obvious injury is a small laceration/abrasion left eye and an abrasion on his left knee. Patient normally ambulates with assistance and according to his son was ambulating yesterday. Patient has a history of dementia and is unable to offer any significant history or review of systems  Past Medical History:  Diagnosis Date  . Acute bronchitis 11/22/2015   . BPH (benign prostatic hypertrophy)   . CAD (coronary artery disease)    a. s/p CABG approx 1997; b. patient of Dr. Wynonia Lawman, MD  . Chronic indwelling Foley catheter   . Dementia   . Dementia   . Fall    11/22/2015    . Hyperlipidemia   . Hypothyroid   . PAF (paroxysmal atrial fibrillation) (HCC)    a. not on long term full dose anticoagulation  . Prostate CA ([place])   . Urinary tract infection 11/22/2015     Patient Active Problem List   Diagnosis Date Noted  . Hypoxia 12/06/2016  . GI bleed 03/19/2016  . CAD (coronary artery disease) 03/19/2016  . Pressure ulcer 12/18/2015  . Urinary retention   . Paroxysmal atrial fibrillation (HCC)   . Typical atrial flutter ([place])   . NSTEMI (non-ST elevated myocardial infarction) ([place]) 12/14/2015  . Atrial fibrillation with rapid ventricular response ([place])   . S/P TURP   . Hematuria   . S/P CABG (coronary artery bypass graft)   . Hypotension   . BPH (benign prostatic hypertrophy) with urinary retention 12/12/2015  . UTI (urinary tract infection) 11/23/2015  . UTI (lower urinary tract infection) 11/22/2015  . Prostate cancer ([place]) 11/22/2015  . Hypothyroidism 11/22/2015  . Atrial fibrillation ([place]) 11/22/2015  . Dementia 11/22/2015  . Fall 11/22/2015  . Bronchitis 11/22/2015    Past  Surgical History:  Procedure Laterality Date  . CORONARY ARTERY BYPASS GRAFT    . TRANSURETHRAL RESECTION OF PROSTATE N/A 12/12/2015   Procedure: TRANSURETHRAL RESECTION OF THE PROSTATE ;  Surgeon: Kathie Rhodes, MD;  Location: [facility];  Service: Urology;  Laterality: N/A;    Past Surgical History:  Procedure Laterality Date  . CORONARY ARTERY BYPASS GRAFT    . TRANSURETHRAL RESECTION OF PROSTATE N/A 12/12/2015   Procedure: TRANSURETHRAL RESECTION OF THE PROSTATE ;  Surgeon: Kathie Rhodes, MD;  Location: [facility];  Service: Urology;  Laterality: N/A;    Current Outpatient Rx  . Order #: [id number] Class: Historical Med  . Order #: [id number] Class: Print  . Order #: [id number] Class: Historical Med  . Order #: [id number] Class: Historical Med  . Order #: [id number] Class: Historical Med  . Order #: [id number] Class: Historical Med  . Order #: [id number] Class: Historical Med  . Order #: [id number] Class: Historical Med  . Order #: [id number] Class: Historical Med  . Order #: [id number] Class: Historical Med  . Order #: [id number] Class: Normal  . Order #: [id number] Class: Historical Med  . Order #: [id number] Class: Normal  . Order #: [id number] Class: Historical Med  . Order #: [id number] Class: Historical Med  . Order #: [id number] Class: Print  . Order #: [id number] Class: Historical Med  . Order #: [id number] Class: Historical Med  . Order #: [id number] Class: Historical Med  . Order #: [id number] Class: Historical Med  . Order #: [id number] Class: Historical  Med  . Order #: [id number] Class: Historical Med  . Order #: [id number] Class: Normal  . Order #: [id number] Class: Print  . Order #: [id number] Class: Historical Med  . Order #: [id number] Class: Normal    Allergies:  Patient has no known allergies.  Family History: Family History  Problem Relation Age of Onset  . Heart disease Father   . Cancer Brother     Social History: Social History  Substance Use Topics  . Smoking status: Former Research scientist (life sciences)  . Smokeless  tobacco: Never Used  . Alcohol use No     Review of Systems:   10 point review of systems was performed and was otherwise negative: Review of systems was acquired from the medical record along with the EMS note his son was able to offer more information upon arrival and states he seems to be somewhat lethargic and more so than usual. Constitutional: No fever Eyes: No visual disturbances ENT: No sore throat, ear pain Cardiac: No chest pain Respiratory: No shortness of breath, wheezing, or stridor Abdomen: No abdominal pain, no vomiting, No diarrhea Endocrine: No weight loss, No night sweats Extremities: No peripheral edema, cyanosis Skin: No rashes, easy bruising Neurologic: No focal weakness, trouble with speech or swollowing Urologic: No dysuria, Hematuria, or urinary frequency   Physical Exam:  ED Triage Vitals [12/06/16 0852]  Enc Vitals Group     BP 112/66     Pulse Rate 94     Resp 18     Temp 97.8 F (36.6 C)     Temp Source Axillary     SpO2 97 %     Weight 150 lb (68 kg)     Height      Head Circumference      Peak Flow      Pain Score      Pain Loc      Pain Edu?      Excl. in [place]?     General: Awake , Alert , and Oriented times 1 and will follow commands Head: Abrasion above the left side without crepitus or step-off noted Eyes: Pupils equal , round, reactive to light Nose/Throat: No nasal drainage, patent upper airway without erythema or exudate.  Neck: Supple, Full range of motion, No anterior adenopathy or palpable thyroid masses Lungs: Clear to ascultation without wheezes , rhonchi, or rales Heart: Regular rate, irregular rhythm without murmurs , gallops , or rubs Abdomen: Soft, non tender without rebound, guarding , or rigidity; bowel sounds positive and symmetric in all 4 quadrants. No organomegaly .        Extremities: Abrasion left knee non-suturable. The knee itself appears stable to palpation without any crepitus or step-off noted Neurologic:  normal ambulation, Motor symmetric without deficits, sensory intact Skin: warm, dry, no rashes   Labs:   All laboratory work was reviewed including any pertinent negatives or positives listed below:  Labs Reviewed  CBC WITH DIFFERENTIAL/PLATELET - Abnormal; Notable for the following:       Result Value   WBC 3.7 (*)    RBC 3.80 (*)    Hemoglobin 11.7 (*)    HCT 34.7 (*)    RDW 14.7 (*)    Platelets 111 (*)    Lymphs Abs 0.8 (*)    All other components within normal limits  COMPREHENSIVE METABOLIC PANEL - Abnormal; Notable for the following:    BUN 24 (*)    Calcium 8.6 (*)    Total Protein 6.4 (*)    Albumin 3.3 (*)  GFR calc non Af Amer 59 (*)    All other components within normal limits  TROPONIN I - Abnormal; Notable for the following:    Troponin I 0.03 (*)    All other components within normal limits  BRAIN NATRIURETIC PEPTIDE - Abnormal; Notable for the following:    B Natriuretic Peptide 338.0 (*)    All other components within normal limits  FIBRIN DERIVATIVES D-DIMER ([facility] ONLY) - Abnormal; Notable for the following:    Fibrin derivatives D-dimer [facility] Iv) 1,204.36 (*)    All other components within normal limits  VALPROIC ACID LEVEL    EKG:  ED ECG REPORT I, Daymon Larsen, the attending physician, personally viewed and interpreted this ECG.  Date: 12/06/2016 EKG Time:1352 Rate: 96 Rhythm: Atrial fibrillation QRS Axis: normal Intervals: normal ST/T Wave abnormalities: normal Conduction Disturbances: none Narrative Interpretation: unremarkable No acute ischemic changes  Radiology:   "Dg Chest 2 View  Result Date: 12/06/2016 CLINICAL DATA:  Hypoxia. EXAM: CHEST  2 VIEW COMPARISON:  Radiographs of November 22, 2015. FINDINGS: Stable cardiomediastinal silhouette. Status post coronary artery bypass graft. No pneumothorax or pleural effusion is noted. No acute pulmonary disease is noted. Bony thorax is unremarkable. IMPRESSION: No active cardiopulmonary  disease. Electronically Signed   By: Marijo Conception, M.D.   On: 12/06/2016 12:27   Dg Knee 2 Views Left  Result Date: 12/06/2016 CLINICAL DATA:  Fall, abrasions to left knee EXAM: LEFT KNEE - 1-2 VIEW COMPARISON:  None. FINDINGS: No acute bony abnormality. Specifically, no fracture, subluxation, or dislocation. Soft tissues are intact. Joint spaces maintained. No joint effusion. IMPRESSION: No acute bony abnormality. Electronically Signed   By: Rolm Baptise M.D.   On: 12/06/2016 09:28   Ct Head Wo Contrast  Result Date: 12/06/2016 CLINICAL DATA:  Unwitnessed fall, headaches and neck pain, initial encounter EXAM: CT HEAD WITHOUT CONTRAST CT CERVICAL SPINE WITHOUT CONTRAST TECHNIQUE: Multidetector CT imaging of the head and cervical spine was performed following the standard protocol without intravenous contrast. Multiplanar CT image reconstructions of the cervical spine were also generated. COMPARISON:  06/22/2016, 04/27/2016 FINDINGS: CT HEAD FINDINGS Brain: Scattered areas of decreased attenuation are noted within the cerebellum bilaterally consistent with prior infarcts. Chronic atrophic and ischemic changes are seen. No findings to suggest acute hemorrhage, acute infarction or space-occupying mass lesion are noted. Vascular: No hyperdense vessel or unexpected calcification. Skull: No acute bony abnormality is same. Sinuses/Orbits: Mild mucosal thickening is noted within the maxillary antra bilaterally. Other: None CT CERVICAL SPINE FINDINGS Alignment: No alignment abnormalities are noted. Skull base and vertebrae: Seven cervical segments are well visualized. Vertebral body height is well maintained. Facet hypertrophic changes are noted without evidence of acute fracture or acute facet abnormality. Disc space narrowing is noted at multiple levels with associated osteophytes. These changes are similar to that seen on prior exam. Stable sclerotic foci are noted within C2, C6 and T2. This again may a  represent sequela from the patient's known prostate carcinoma. Soft tissues and spinal canal: No acute soft tissue abnormality is noted. Bilateral carotid calcifications are seen. Disc levels: Mild disc bulging is noted at C4-5, C5-6 and C6-7 associated with the osteophytic change. Upper chest: Within normal limits. IMPRESSION: CT of the head: Chronic atrophic and ischemic changes. No acute intracranial abnormality noted. CT of the cervical spine: Multilevel degenerative change stable from the prior exam. Sclerotic changes within the cervical spine stable from the previous exam which may be related to the patient's known prostate  carcinoma. No acute abnormality noted. Electronically Signed   By: Inez Catalina M.D.   On: 12/06/2016 09:53   Ct Angio Chest Pe W Or Wo Contrast  Result Date: 12/06/2016 CLINICAL DATA:  Hypoxia. EXAM: CT ANGIOGRAPHY CHEST WITH CONTRAST TECHNIQUE: Multidetector CT imaging of the chest was performed using the standard protocol during bolus administration of intravenous contrast. Multiplanar CT image reconstructions and MIPs were obtained to evaluate the vascular anatomy. CONTRAST:  75 mL of Isovue 370 intravenously. COMPARISON:  CT scan of December 23, 2014. FINDINGS: Cardiovascular: There is no definite evidence of pulmonary embolus. Atherosclerosis of thoracic aorta is noted without aneurysm. Status post coronary artery bypass graft. No pericardial effusion is noted. Mediastinum/Nodes: No enlarged mediastinal, hilar, or axillary lymph nodes. Thyroid gland, trachea, and esophagus demonstrate no significant findings. Lungs/Pleura: Lungs are clear. No pleural effusion or pneumothorax. Upper Abdomen: No acute abnormality. Musculoskeletal: No chest wall abnormality. No acute or significant osseous findings. Review of the MIP images confirms the above findings. IMPRESSION: Aortic atherosclerosis. No definite evidence of pulmonary embolus. No acute abnormality seen in the chest. Electronically  Signed   By: Marijo Conception, M.D.   On: 12/06/2016 14:50   Ct Cervical Spine Wo Contrast  Result Date: 12/06/2016 CLINICAL DATA:  Unwitnessed fall, headaches and neck pain, initial encounter EXAM: CT HEAD WITHOUT CONTRAST CT CERVICAL SPINE WITHOUT CONTRAST TECHNIQUE: Multidetector CT imaging of the head and cervical spine was performed following the standard protocol without intravenous contrast. Multiplanar CT image reconstructions of the cervical spine were also generated. COMPARISON:  06/22/2016, 04/27/2016 FINDINGS: CT HEAD FINDINGS Brain: Scattered areas of decreased attenuation are noted within the cerebellum bilaterally consistent with prior infarcts. Chronic atrophic and ischemic changes are seen. No findings to suggest acute hemorrhage, acute infarction or space-occupying mass lesion are noted. Vascular: No hyperdense vessel or unexpected calcification. Skull: No acute bony abnormality is same. Sinuses/Orbits: Mild mucosal thickening is noted within the maxillary antra bilaterally. Other: None CT CERVICAL SPINE FINDINGS Alignment: No alignment abnormalities are noted. Skull base and vertebrae: Seven cervical segments are well visualized. Vertebral body height is well maintained. Facet hypertrophic changes are noted without evidence of acute fracture or acute facet abnormality. Disc space narrowing is noted at multiple levels with associated osteophytes. These changes are similar to that seen on prior exam. Stable sclerotic foci are noted within C2, C6 and T2. This again may a represent sequela from the patient's known prostate carcinoma. Soft tissues and spinal canal: No acute soft tissue abnormality is noted. Bilateral carotid calcifications are seen. Disc levels: Mild disc bulging is noted at C4-5, C5-6 and C6-7 associated with the osteophytic change. Upper chest: Within normal limits. IMPRESSION: CT of the head: Chronic atrophic and ischemic changes. No acute intracranial abnormality noted. CT of  the cervical spine: Multilevel degenerative change stable from the prior exam. Sclerotic changes within the cervical spine stable from the previous exam which may be related to the patient's known prostate carcinoma. No acute abnormality noted. Electronically Signed   By: Inez Catalina M.D.   On: 12/06/2016 09:53  "    I personally reviewed the radiologic studies    ED Course: Patient was initially evaluated for a non-syncopal fall with a head CT and had cleansing of his wounds which were dressed etc. The patient seems to have some contracture of both his lower extremities and this seems to be a new finding. Further discussion with his side and showed that the primary physician at the facility  was concerned about hypotension and whether or not this may have been some response from the amiodarone. Patient has chronic atrial fibrillation and is currently not on any anticoagulation due to his fall risk. She will prostate cancer which overall is benign the patient does have a DO NOT RESUSCITATE established. The patient upon attempt of ambulation was unable to bear weight and his pulse oximetry dropped to a significantly low levels (see nurses note). It seemed to be orthostatic in nature. Because of his difficulty with ambulation and his hypoxemia discuss case with the hospitalist. Further disposition and management depends upon their evaluation  Assessment:  Hypoxia   Final Clinical Impression:   Final diagnoses:  Hypoxia     Plan:  Inpatient management            Daymon Larsen, MD 12/06/16 1529

## 2016-12-06 NOTE — ED Notes (Signed)
Patient's abrasion to knee and lac to left eye brow dressed with steri strips after being cleaned with NS.

## 2016-12-06 NOTE — ED Notes (Signed)
Attempted to ambulate patient with another RN assist.  Patient unable to bear much weight on legs and ambulate.  Patient unsteady on feet and assisted back into the bed.  MD notified of this information.

## 2016-12-06 NOTE — ED Notes (Signed)
Patient returned from radiology

## 2016-12-06 NOTE — ED Notes (Signed)
Admitting MD at bedside.

## 2016-12-06 NOTE — ED Triage Notes (Addendum)
Pt had unwitnessed fall this morning. Pt has frequent falls. Bandage to left arm from previous fall. Small laceration above left eye with small hematoma. Skin tear left knee. Pt denies pain. Currently being tx for UTI. Pt from [facility]

## 2016-12-06 NOTE — ED Notes (Signed)
[facility] updated on patient's care and that we are still waiting on his CT scan to get back before any decisions can be made.

## 2016-12-07 DIAGNOSIS — R0902 Hypoxemia: Secondary | ICD-10-CM | POA: Diagnosis not present

## 2016-12-07 DIAGNOSIS — J189 Pneumonia, unspecified organism: Secondary | ICD-10-CM | POA: Diagnosis not present

## 2016-12-07 LAB — BASIC METABOLIC PANEL
ANION GAP: 9 (ref 5–15)
BUN: 18 mg/dL (ref 6–20)
CALCIUM: 8.4 mg/dL — AB (ref 8.9–10.3)
CO2: 24 mmol/L (ref 22–32)
CREATININE: 1.05 mg/dL (ref 0.61–1.24)
Chloride: 106 mmol/L (ref 101–111)
GFR calc non Af Amer: >60 mL/min (ref >60)
Glucose, Bld: 94 mg/dL (ref 65–99)
Potassium: 3.2 mmol/L — ABNORMAL LOW (ref 3.5–5.1)
SODIUM: 139 mmol/L (ref 135–145)

## 2016-12-07 LAB — CBC
HCT: 32.7 % — ABNORMAL LOW (ref 40.0–52.0)
HEMOGLOBIN: 11.2 g/dL — AB (ref 13.0–18.0)
MCH: 31.4 pg (ref 26.0–34.0)
MCHC: 34.3 g/dL (ref 32.0–36.0)
MCV: 91.5 fL (ref 80.0–100.0)
PLATELETS: 105 10*3/uL — AB (ref 150–440)
RBC: 3.58 MIL/uL — ABNORMAL LOW (ref 4.40–5.90)
RDW: 14.4 % (ref 11.5–14.5)
WBC: 3.3 10*3/uL — ABNORMAL LOW (ref 3.8–10.6)

## 2016-12-07 LAB — TSH: TSH: 2.382 u[IU]/mL (ref 0.350–4.500)

## 2016-12-07 MED ORDER — POTASSIUM CHLORIDE CRYS ER 20 MEQ PO TBCR
40.0000 meq | EXTENDED_RELEASE_TABLET | Freq: Once | ORAL | Status: AC
  Administered 2016-12-07: 40 meq via ORAL
  Filled 2016-12-07: qty 2

## 2016-12-07 NOTE — Clinical Social Work Note (Signed)
CSW received referral that patient is from [facility], but will need SNF placement.  CSW to meet with patient at a later time.  Jones Broom. Norval Morton, MSW, [place]  12/07/2016 4:49 PM

## 2016-12-07 NOTE — Evaluation (Signed)
Physical Therapy Evaluation Patient Details Name: Adam Nielsen MRN: [id number] DOB: 23-Jun-1925 Today's Date: 12/07/2016   History of Present Illness  Pt is a 83 y.o. male presenting to hospital s/p unwitnessed fall 12/06/16 sustaining small laceration L eye with small hematoma and skin tear L knee; pt also found to have low BP.  Pt admitted to hospital with acute respiratory failure with hypoxia, PNA, and UTI.  PMH includes dementia, CAD s/p CABG, chronic indwelling foley catheter, PAF, and frequent falls.  Clinical Impression  Prior to hospital admission, per notes pt was ambulatory with walker.  Pt lives at [facility].  Pt only answered question regarding name and did not answer any other questions during session.  Currently pt requires significant assist with bed mobility and standing with RW (significant posterior lean noted in standing both trials); able to ambulate 45 feet min assist x2 with RW.  Pt would benefit from skilled PT to address noted impairments and functional limitations.  Recommend pt discharge to STR when medically appropriate.    Follow Up Recommendations SNF    Equipment Recommendations  Rolling walker with 5" wheels    Recommendations for Other Services       Precautions / Restrictions Precautions Precautions: Fall Restrictions Weight Bearing Restrictions: No      Mobility  Bed Mobility Overal bed mobility: Needs Assistance Bed Mobility: Supine to Sit;Sit to Supine     Supine to sit: Mod assist;+2 for physical assistance;HOB elevated Sit to supine: Mod assist;+2 for physical assistance;HOB elevated   General bed mobility comments: assist for trunk and B LE's  Transfers Overall transfer level: Needs assistance Equipment used: Rolling walker (2 wheeled) Transfers: Sit to/from Stand Sit to Stand: Mod assist;+2 physical assistance         General transfer comment: pt with posterior lean standing requiring assist to shift weight forward and vc's  to correct (x2 trials)  Ambulation/Gait Ambulation/Gait assistance: Min assist;+2 physical assistance Ambulation Distance (Feet): 45 Feet (1st trial pt ambulated 3 feet forward and backward and then sat prior to ambulating again) Assistive device: Rolling walker (2 wheeled)   Gait velocity: decreased   General Gait Details: decreased B step length/foot clearance/heelstrike; vc's to stay closer to walker  Stairs            Wheelchair Mobility    Modified Rankin (Stroke Patients Only)       Balance Overall balance assessment: Needs assistance Sitting-balance support: Bilateral upper extremity supported;Feet supported Sitting balance-Leahy Scale: Fair Sitting balance - Comments: static sitting   Standing balance support: Bilateral upper extremity supported (on RW) Standing balance-Leahy Scale: Poor Standing balance comment: significant posterior lean in standing but improved with ambulation                             Pertinent Vitals/Pain Pain Assessment: Faces Faces Pain Scale: No hurt  Vitals (HR and O2 on room air) stable and WFL throughout treatment session (unable to get an O2 reading immediately after ambulating but O2 95% a few minutes after ambulation on room air; nursing notified).    Home Living Family/patient expects to be discharged to:: Assisted living               Home Equipment: Walker - 4 wheels (Per prior PT note) Additional Comments: Pt lives at [facility].    Prior Function Level of Independence: Needs assistance   Gait / Transfers Assistance Needed: Prior PT notes (about  11 months ago) report pt was using 4ww.     Comments: Per notes, pt was ambulatory; pt also with frequent falls.     Hand Dominance        Extremity/Trunk Assessment   Upper Extremity Assessment Upper Extremity Assessment: Difficult to assess due to impaired cognition (Per chart review pt with L UE weakness from previous CVA)    Lower Extremity  Assessment Lower Extremity Assessment: Difficult to assess due to impaired cognition       Communication   Communication: Expressive difficulties;Receptive difficulties (Per notes, pt with speech impairment from previous CVA; communication may also be complicated d/t dementia)  Cognition Arousal/Alertness: Awake/alert Behavior During Therapy: Impulsive Overall Cognitive Status: Difficult to assess (Oriented to name)                                        General Comments General comments (skin integrity, edema, etc.): L knee bandage in place; bruising noted R eye with abrasion.  Nursing cleared pt for participation in physical therapy.  Pt agreeable to PT session.    Exercises  Transfer and gait training   Assessment/Plan    PT Assessment Patient needs continued PT services  PT Problem List Decreased strength;Decreased activity tolerance;Decreased balance;Decreased mobility;Decreased knowledge of precautions       PT Treatment Interventions DME instruction;Gait training;Functional mobility training;Therapeutic activities;Therapeutic exercise;Balance training;Patient/family education    PT Goals (Current goals can be found in the Care Plan section)  Acute Rehab PT Goals Patient Stated Goal: pt did not state a goal PT Goal Formulation: Patient unable to participate in goal setting    Frequency Min 2X/week   Barriers to discharge Decreased caregiver support Level of assist    Co-evaluation               End of Session Equipment Utilized During Treatment: Gait belt Activity Tolerance: Patient tolerated treatment well Patient left: in bed;with call bell/phone within reach;with bed alarm set Nurse Communication: Mobility status;Precautions PT Visit Diagnosis: Unsteadiness on feet (R26.81);Repeated falls (R29.6);Muscle weakness (generalized) (M62.81);History of falling (Z91.81);Difficulty in walking, not elsewhere classified (R26.2)    Time:  6073-7106 PT Time Calculation (min) (ACUTE ONLY): 26 min   Charges:   PT Evaluation $PT Eval Low Complexity: 1 Procedure PT Treatments $Therapeutic Activity: 8-22 mins   PT G CodesLeitha Bleak, PT 12/07/16, 1:48 PM [phone]

## 2016-12-07 NOTE — Care Management (Signed)
patient is a resident of [facility] assisted [facility].  There frequent ED visits for multiple falls. Upon arrival- patient's pulse ox was 76% on room air and a patient without chronic home 02.  History of dementia.  He is oriented to person at present.  There is concern patient may need higher level of care.  PT consult is pending. CSW referral is present

## 2016-12-07 NOTE — Progress Notes (Signed)
Dr. Benjie Karvonen aware of urine culture results. No new orders.

## 2016-12-07 NOTE — Progress Notes (Addendum)
[facility] at [place] NAME: Adam Nielsen    MR#:  [id number]  DATE OF BIRTH:  January 01, 1925  SUBJECTIVE:   Patient with dementia. He is breathing better he is not found to be hypoxic  REVIEW OF SYSTEMS:    Review of Systems  Unable to perform ROS: Dementia  Respiratory: Positive for cough.   Skin:       Laceration above eye  Psychiatric/Behavioral: Positive for memory loss.    Tolerating Diet: yes      DRUG ALLERGIES:  No Known Allergies  VITALS:  Blood pressure 101/61, pulse 86, temperature 98 F (36.7 C), resp. rate 18, height 5\' 6"  (1.676 m), weight 63.5 kg (139 lb 14.4 oz), SpO2 99 %.  PHYSICAL EXAMINATION:   Physical Exam  Constitutional: He is well-developed, well-nourished, and in no distress. No distress.  HENT:  Head: Normocephalic.  Eyes: No scleral icterus.  Neck: Normal range of motion. Neck supple. No JVD present. No tracheal deviation present.  Cardiovascular: Normal rate and regular rhythm.  Exam reveals no gallop and no friction rub.   No murmur heard. Pulmonary/Chest: Effort normal and breath sounds normal. No respiratory distress. He has no wheezes. He has no rales. He exhibits no tenderness.  Abdominal: Soft. Bowel sounds are normal. He exhibits no distension and no mass. There is no tenderness. There is no rebound and no guarding.  Musculoskeletal: Normal range of motion. He exhibits no edema.  Neurological: He is alert.  Skin: Skin is warm. No rash noted. No erythema.  Laceration above left eye      LABORATORY PANEL:   CBC  Recent Labs Lab 12/07/16 0451  WBC 3.3*  HGB 11.2*  HCT 32.7*  PLT 105*   ------------------------------------------------------------------------------------------------------------------  Chemistries   Recent Labs Lab 12/06/16 1234 12/07/16 0451  NA 141 139  K 3.5 3.2*  CL 107 106  CO2 26 24  GLUCOSE 96 94  BUN 24* 18  CREATININE 1.07 1.05  CALCIUM 8.6*  8.4*  AST 33  --   ALT 19  --   ALKPHOS 54  --   BILITOT 0.6  --    ------------------------------------------------------------------------------------------------------------------  Cardiac Enzymes  Recent Labs Lab 12/06/16 1234  TROPONINI 0.03*   ------------------------------------------------------------------------------------------------------------------  RADIOLOGY:  Dg Chest 2 View  Result Date: 12/06/2016 CLINICAL DATA:  Hypoxia. EXAM: CHEST  2 VIEW COMPARISON:  Radiographs of November 22, 2015. FINDINGS: Stable cardiomediastinal silhouette. Status post coronary artery bypass graft. No pneumothorax or pleural effusion is noted. No acute pulmonary disease is noted. Bony thorax is unremarkable. IMPRESSION: No active cardiopulmonary disease. Electronically Signed   By: Marijo Conception, M.D.   On: 12/06/2016 12:27   Dg Knee 2 Views Left  Result Date: 12/06/2016 CLINICAL DATA:  Fall, abrasions to left knee EXAM: LEFT KNEE - 1-2 VIEW COMPARISON:  None. FINDINGS: No acute bony abnormality. Specifically, no fracture, subluxation, or dislocation. Soft tissues are intact. Joint spaces maintained. No joint effusion. IMPRESSION: No acute bony abnormality. Electronically Signed   By: Rolm Baptise M.D.   On: 12/06/2016 09:28   Ct Head Wo Contrast  Result Date: 12/06/2016 CLINICAL DATA:  Unwitnessed fall, headaches and neck pain, initial encounter EXAM: CT HEAD WITHOUT CONTRAST CT CERVICAL SPINE WITHOUT CONTRAST TECHNIQUE: Multidetector CT imaging of the head and cervical spine was performed following the standard protocol without intravenous contrast. Multiplanar CT image reconstructions of the cervical spine were also generated. COMPARISON:  06/22/2016, 04/27/2016 FINDINGS: CT HEAD  FINDINGS Brain: Scattered areas of decreased attenuation are noted within the cerebellum bilaterally consistent with prior infarcts. Chronic atrophic and ischemic changes are seen. No findings to suggest acute  hemorrhage, acute infarction or space-occupying mass lesion are noted. Vascular: No hyperdense vessel or unexpected calcification. Skull: No acute bony abnormality is same. Sinuses/Orbits: Mild mucosal thickening is noted within the maxillary antra bilaterally. Other: None CT CERVICAL SPINE FINDINGS Alignment: No alignment abnormalities are noted. Skull base and vertebrae: Seven cervical segments are well visualized. Vertebral body height is well maintained. Facet hypertrophic changes are noted without evidence of acute fracture or acute facet abnormality. Disc space narrowing is noted at multiple levels with associated osteophytes. These changes are similar to that seen on prior exam. Stable sclerotic foci are noted within C2, C6 and T2. This again may a represent sequela from the patient's known prostate carcinoma. Soft tissues and spinal canal: No acute soft tissue abnormality is noted. Bilateral carotid calcifications are seen. Disc levels: Mild disc bulging is noted at C4-5, C5-6 and C6-7 associated with the osteophytic change. Upper chest: Within normal limits. IMPRESSION: CT of the head: Chronic atrophic and ischemic changes. No acute intracranial abnormality noted. CT of the cervical spine: Multilevel degenerative change stable from the prior exam. Sclerotic changes within the cervical spine stable from the previous exam which may be related to the patient's known prostate carcinoma. No acute abnormality noted. Electronically Signed   By: Inez Catalina M.D.   On: 12/06/2016 09:53   Ct Angio Chest Pe W Or Wo Contrast  Result Date: 12/06/2016 CLINICAL DATA:  Hypoxia. EXAM: CT ANGIOGRAPHY CHEST WITH CONTRAST TECHNIQUE: Multidetector CT imaging of the chest was performed using the standard protocol during bolus administration of intravenous contrast. Multiplanar CT image reconstructions and MIPs were obtained to evaluate the vascular anatomy. CONTRAST:  75 mL of Isovue 370 intravenously. COMPARISON:  CT scan  of December 23, 2014. FINDINGS: Cardiovascular: There is no definite evidence of pulmonary embolus. Atherosclerosis of thoracic aorta is noted without aneurysm. Status post coronary artery bypass graft. No pericardial effusion is noted. Mediastinum/Nodes: No enlarged mediastinal, hilar, or axillary lymph nodes. Thyroid gland, trachea, and esophagus demonstrate no significant findings. Lungs/Pleura: Lungs are clear. No pleural effusion or pneumothorax. Upper Abdomen: No acute abnormality. Musculoskeletal: No chest wall abnormality. No acute or significant osseous findings. Review of the MIP images confirms the above findings. IMPRESSION: Aortic atherosclerosis. No definite evidence of pulmonary embolus. No acute abnormality seen in the chest. Electronically Signed   By: Marijo Conception, M.D.   On: 12/06/2016 14:50   Ct Cervical Spine Wo Contrast  Result Date: 12/06/2016 CLINICAL DATA:  Unwitnessed fall, headaches and neck pain, initial encounter EXAM: CT HEAD WITHOUT CONTRAST CT CERVICAL SPINE WITHOUT CONTRAST TECHNIQUE: Multidetector CT imaging of the head and cervical spine was performed following the standard protocol without intravenous contrast. Multiplanar CT image reconstructions of the cervical spine were also generated. COMPARISON:  06/22/2016, 04/27/2016 FINDINGS: CT HEAD FINDINGS Brain: Scattered areas of decreased attenuation are noted within the cerebellum bilaterally consistent with prior infarcts. Chronic atrophic and ischemic changes are seen. No findings to suggest acute hemorrhage, acute infarction or space-occupying mass lesion are noted. Vascular: No hyperdense vessel or unexpected calcification. Skull: No acute bony abnormality is same. Sinuses/Orbits: Mild mucosal thickening is noted within the maxillary antra bilaterally. Other: None CT CERVICAL SPINE FINDINGS Alignment: No alignment abnormalities are noted. Skull base and vertebrae: Seven cervical segments are well visualized. Vertebral body  height is well  maintained. Facet hypertrophic changes are noted without evidence of acute fracture or acute facet abnormality. Disc space narrowing is noted at multiple levels with associated osteophytes. These changes are similar to that seen on prior exam. Stable sclerotic foci are noted within C2, C6 and T2. This again may a represent sequela from the patient's known prostate carcinoma. Soft tissues and spinal canal: No acute soft tissue abnormality is noted. Bilateral carotid calcifications are seen. Disc levels: Mild disc bulging is noted at C4-5, C5-6 and C6-7 associated with the osteophytic change. Upper chest: Within normal limits. IMPRESSION: CT of the head: Chronic atrophic and ischemic changes. No acute intracranial abnormality noted. CT of the cervical spine: Multilevel degenerative change stable from the prior exam. Sclerotic changes within the cervical spine stable from the previous exam which may be related to the patient's known prostate carcinoma. No acute abnormality noted. Electronically Signed   By: Inez Catalina M.D.   On: 12/06/2016 09:53     ASSESSMENT AND PLAN:   83 year old male with atrial fibrillation presents with weakness and found to have pneumonia  1. Acute respiratory failure with hypoxia: Patient was initially found to have oxygen saturation of 76% on room air. He is currently 96% on room air. CT scan of chest showed no evidence of pulmonary emboli.  2. Unwitnessed fall with laceration above left eye Plan for PT consult  3. Urinary tract infection: Continue Rocephin  4. Atrial fibrillation: Continue amiodarone Not on antiplatelet physician due to fall risk  5. Essential hypertension: Blood pressure still low normal and continue to hold blood pressure dictations  6. Hypothyroidism: Continue Synthroid  7. Hyperlipidemia: Continue Crestor  8. Depression: Continue Zoloft  9. Dementia: Continue Aricept  10. BPH: Continue Proscar   Management plans discussed  with the patient and he is in agreement.  CODE STATUS: DNR  TOTAL TIME TAKING CARE OF THIS PATIENT: 30 minutes.   Await PT consult for dispo.  POSSIBLE D/C 1-2 days DEPENDING ON CLINICAL CONDITION.   Kalman Nylen M.D on 12/07/2016 at 10:31 AM  Between 7am to 6pm - Pager - [phone] After 6pm go to [email or web address] - password [email or web address] [facility]  Office  [phone]  CC: Primary care physician; Sarajane Jews, MD  Note: This dictation was prepared with Dragon dictation along with smaller phrase technology. Any transcriptional errors that result from this process are unintentional.

## 2016-12-08 LAB — URINE CULTURE: Culture: >=100000 — AB

## 2016-12-08 MED ORDER — POTASSIUM CHLORIDE CRYS ER 20 MEQ PO TBCR
40.0000 meq | EXTENDED_RELEASE_TABLET | Freq: Once | ORAL | Status: AC
  Administered 2016-12-08: 40 meq via ORAL
  Filled 2016-12-08: qty 2

## 2016-12-08 MED ORDER — CEPHALEXIN 500 MG PO CAPS
500.0000 mg | ORAL_CAPSULE | Freq: Three times a day (TID) | ORAL | Status: DC
  Administered 2016-12-08 – 2016-12-10 (×7): 500 mg via ORAL
  Filled 2016-12-08 (×7): qty 1

## 2016-12-08 NOTE — Clinical Social Work Note (Signed)
Clinical Social Work Assessment  Patient Details  Name: Adam Nielsen MRN: [id number] Date of Birth: [birth year]  Date of referral:  12/08/16               Reason for consult:  Facility Placement                Permission sought to share information with:  Facility Art therapist granted to share information::  Yes, Verbal Permission Granted  Name::        Agency::     Relationship::     Contact Information:     Housing/Transportation Living arrangements for the past 2 months:  [place] of Information:  Adult Children Patient Interpreter Needed:  None Criminal Activity/Legal Involvement Pertinent to Current Situation/Hospitalization:  No - Comment as needed Significant Relationships:  Adult Children Lives with:  Facility Resident Do you feel safe going back to the place where you live?  No Need for family participation in patient care:  Yes (Comment)  Care giving concerns:  PT recommendation for STR   Social Worker assessment / plan:  CSW spoke with the patient's son via telephone to discuss dc planning. The patient's son gave verbal permission to conduct SNF bed search, and he indicated that [facility] in [facility] as his preference due to location. The CSW alerted the patient's son that a bed at [facility] is available as soon as tomorrow, but the patient's son declined the offer unless no others are made.  The patient at baseline is ambulatory. He receives some help with bathing/feeding/grooming, and he is incontinent of bladder. The patient is a resident at [facility], and the plan is for him to return once STR is completed.  Employment status:  Retired Nurse, adult PT Recommendations:  [place] / Referral to community resources:  [place]  Patient/Family's Response to care:  The patient's daughter indicated that the patient's son handles medical decisions. The  patient's son thanked the CSW for assistance.  Patient/Family's Understanding of and Emotional Response to Diagnosis, Current Treatment, and Prognosis:  The patient's family is in agreement with the dc plan to [facility] for STR.  Emotional Assessment Appearance:  Appears stated age Attitude/Demeanor/Rapport:  Lethargic Affect (typically observed):  Accepting Orientation:  Oriented to Self Alcohol / Substance use:  Never Used Psych involvement (Current and /or in the community):  No (Comment)  Discharge Needs  Concerns to be addressed:  Care Coordination Readmission within the last 30 days:  No Current discharge risk:  None Barriers to Discharge:  Continued Medical Work up   [employer], LCSW 12/08/2016, 2:15 PM

## 2016-12-08 NOTE — Clinical Social Work Placement (Addendum)
   CLINICAL SOCIAL WORK PLACEMENT  NOTE  Date:  12/08/2016  Patient Details  Name: Adam Nielsen MRN: [id number] Date of Birth: May 27, 1925  Clinical Social Work is seeking post-discharge placement for this patient at the [place] level of care (*CSW will initial, date and re-position this form in  chart as items are completed):  Yes   Patient/family provided with [place] Work Department's list of facilities offering this level of care within the geographic area requested by the patient (or if unable, by the patient's family).  Yes   Patient/family informed of their freedom to choose among providers that offer the needed level of care, that participate in Medicare, Medicaid or managed care program needed by the patient, have an available bed and are willing to accept the patient.  Yes   Patient/family informed of [facility] ownership interest in [facility] and [facility], as well as of the fact that they are under no obligation to receive care at these facilities.  PASRR submitted to EDS on       PASRR number received on       Existing PASRR number confirmed on 12/08/16     FL2 transmitted to all facilities in geographic area requested by pt/family on 12/08/16     FL2 transmitted to all facilities within larger geographic area on       Patient informed that his/her managed care company has contracts with or will negotiate with certain facilities, including the following:         12-10-16   Patient/family informed of bed offers received.  (Updated 12-10-16 Evette Cristal, MSW, [facility])  Patient chooses bed at  [facility]  (Updated 12-10-16 Evette Cristal, MSW, [facility])     Physician recommends and patient chooses bed at      Patient to be transferred to  [facility] on  12-10-16.  (Updated 12-10-16 Evette Cristal, MSW, [place])  Patient to be transferred to facility by  Patient's son's vehicle  (Updated 12-10-16 Evette Cristal, MSW, [place])     Patient family notified on  12-10-16 of transfer.  (Updated 12-10-16 Evette Cristal, MSW, [place])  Name of family member notified:    [name] son.  (Updated 12-10-16 Evette Cristal, MSW, [place]) PHYSICIAN       Additional Comment:    _______________________________________________ Zettie Pho, LCSW 12/08/2016, 2:22 PM  (Updated 12-10-16 Evette Cristal, MSW, [facility])

## 2016-12-08 NOTE — Progress Notes (Signed)
[facility] [place] NAME: Adam Nielsen    MR#:  [id number]  DATE OF BIRTH:  07-25-1925  SUBJECTIVE:   Patient with dementia. He is breathing better he is not found to be hypoxic  REVIEW OF SYSTEMS:    Review of Systems  Unable to perform ROS: Dementia  Respiratory: Positive for cough.   Skin:       Laceration above eye  Psychiatric/Behavioral: Positive for memory loss.    Tolerating Diet: yes      DRUG ALLERGIES:  No Known Allergies  VITALS:  Blood pressure (!) 105/55, pulse 75, temperature 97.8 F (36.6 C), temperature source Oral, resp. rate 18, height 5\' 6"  (1.676 m), weight 63.5 kg (139 lb 14.4 oz), SpO2 96 %.  PHYSICAL EXAMINATION:   Physical Exam  Constitutional: He is well-developed, well-nourished, and in no distress. No distress.  HENT:  Head: Normocephalic.  Eyes: No scleral icterus.  Neck: Normal range of motion. Neck supple. No JVD present. No tracheal deviation present.  Cardiovascular: Normal rate and regular rhythm.  Exam reveals no gallop and no friction rub.   No murmur heard. Pulmonary/Chest: Effort normal. No respiratory distress. He has no wheezes. He has no rales. He exhibits no tenderness.  rhoncherous  Breath sounds b/l  Abdominal: Soft. Bowel sounds are normal. He exhibits no distension and no mass. There is no tenderness. There is no rebound and no guarding.  Musculoskeletal: Normal range of motion. He exhibits no edema.  Neurological: He is alert.  Skin: Skin is warm. No rash noted. No erythema.  Laceration above left eye      LABORATORY PANEL:   CBC  Recent Labs Lab 12/07/16 0451  WBC 3.3*  HGB 11.2*  HCT 32.7*  PLT 105*   ------------------------------------------------------------------------------------------------------------------  Chemistries   Recent Labs Lab 12/06/16 1234 12/07/16 0451  NA 141 139  K 3.5 3.2*  CL 107 106  CO2 26 24  GLUCOSE 96 94  BUN 24* 18   CREATININE 1.07 1.05  CALCIUM 8.6* 8.4*  AST 33  --   ALT 19  --   ALKPHOS 54  --   BILITOT 0.6  --    ------------------------------------------------------------------------------------------------------------------  Cardiac Enzymes  Recent Labs Lab 12/06/16 1234  TROPONINI 0.03*   ------------------------------------------------------------------------------------------------------------------  RADIOLOGY:  Dg Chest 2 View  Result Date: 12/06/2016 CLINICAL DATA:  Hypoxia. EXAM: CHEST  2 VIEW COMPARISON:  Radiographs of November 22, 2015. FINDINGS: Stable cardiomediastinal silhouette. Status post coronary artery bypass graft. No pneumothorax or pleural effusion is noted. No acute pulmonary disease is noted. Bony thorax is unremarkable. IMPRESSION: No active cardiopulmonary disease. Electronically Signed   By: Marijo Conception, M.D.   On: 12/06/2016 12:27   Ct Angio Chest Pe W Or Wo Contrast  Result Date: 12/06/2016 CLINICAL DATA:  Hypoxia. EXAM: CT ANGIOGRAPHY CHEST WITH CONTRAST TECHNIQUE: Multidetector CT imaging of the chest was performed using the standard protocol during bolus administration of intravenous contrast. Multiplanar CT image reconstructions and MIPs were obtained to evaluate the vascular anatomy. CONTRAST:  75 mL of Isovue 370 intravenously. COMPARISON:  CT scan of December 23, 2014. FINDINGS: Cardiovascular: There is no definite evidence of pulmonary embolus. Atherosclerosis of thoracic aorta is noted without aneurysm. Status post coronary artery bypass graft. No pericardial effusion is noted. Mediastinum/Nodes: No enlarged mediastinal, hilar, or axillary lymph nodes. Thyroid gland, trachea, and esophagus demonstrate no significant findings. Lungs/Pleura: Lungs are clear. No pleural effusion or pneumothorax. Upper Abdomen:  No acute abnormality. Musculoskeletal: No chest wall abnormality. No acute or significant osseous findings. Review of the MIP images confirms the above  findings. IMPRESSION: Aortic atherosclerosis. No definite evidence of pulmonary embolus. No acute abnormality seen in the chest. Electronically Signed   By: Marijo Conception, M.D.   On: 12/06/2016 14:50     ASSESSMENT AND PLAN:   83 year old male with atrial fibrillation presents with weakness and found to have pneumonia  1. Acute respiratory failure with hypoxia: Patient was initially found to have oxygen saturation of 76% on room air. He is currently 96% on room air. CT scan of chest showed no evidence of pulmonary emboli.  2. Unwitnessed fall with laceration above left eye SNF at discharge  3. Klebsiella Urinary tract infection: Change to PO keflex as per Sensitivities. 4. Atrial fibrillation: Continue amiodarone Not on antiplatelet physician due to fall risk  5. Essential hypertension: Blood pressure still low normal and continue to hold blood pressure medictations  6. Hypothyroidism: Continue Synthroid  7. Hyperlipidemia: Continue Crestor  8. Depression: Continue Zoloft  9. Dementia: Continue Aricept  10. BPH: Continue Proscar  11. Hypokalemia: replete   CODE STATUS: DNR  TOTAL TIME TAKING CARE OF THIS PATIENT: 24 minutes.    POSSIBLE D/C TOMORROW DEPENDING ON CLINICAL CONDITION.   [name] M.D on 12/08/2016 at 10:35 AM  Between 7am to 6pm - Pager - [phone] After 6pm go to [email or web address] - password [email or web address] [place] [facility]  Office  [phone]  CC: Primary care physician; Sarajane Jews, MD  Note: This dictation was prepared with Dragon dictation along with smaller phrase technology. Any transcriptional errors that result from this process are unintentional.

## 2016-12-08 NOTE — NC FL2 (Signed)
[place] LEVEL OF CARE SCREENING TOOL     IDENTIFICATION  Patient Name: Adam Nielsen Birthdate: [birth year] Sex: male Admission Date (Current Location): 12/06/2016  [place] and [place] Number:  Engineering geologist and Address:  [facility], [address] [place], [place] [address]      Provider Number: [id number]  Attending Physician Name and Address:  Bettey Costa, MD  Relative Name and Phone Number:       Current Level of Care: Hospital Recommended Level of Care: [place] Prior Approval Number:    Date Approved/Denied:   PASRR Number: [id number]  Discharge Plan: SNF    Current Diagnoses: Patient Active Problem List   Diagnosis Date Noted  . Hypoxia 12/06/2016  . GI bleed 03/19/2016  . CAD (coronary artery disease) 03/19/2016  . Pressure ulcer 12/18/2015  . Urinary retention   . Paroxysmal atrial fibrillation (HCC)   . Typical atrial flutter ([place])   . NSTEMI (non-ST elevated myocardial infarction) ([place]) 12/14/2015  . Atrial fibrillation with rapid ventricular response ([place])   . S/P TURP   . Hematuria   . S/P CABG (coronary artery bypass graft)   . Hypotension   . BPH (benign prostatic hypertrophy) with urinary retention 12/12/2015  . UTI (urinary tract infection) 11/23/2015  . UTI (lower urinary tract infection) 11/22/2015  . Prostate cancer ([place]) 11/22/2015  . Hypothyroidism 11/22/2015  . Atrial fibrillation ([place]) 11/22/2015  . Dementia 11/22/2015  . Fall 11/22/2015  . Bronchitis 11/22/2015    Orientation RESPIRATION BLADDER Height & Weight     Self  Normal Incontinent Weight: 139 lb 14.4 oz (63.5 kg) Height:  5\' 6"  (167.6 cm)  BEHAVIORAL SYMPTOMS/MOOD NEUROLOGICAL BOWEL NUTRITION STATUS      Incontinent Diet (Cardiac diet)  AMBULATORY STATUS COMMUNICATION OF NEEDS Skin   Extensive Assist Verbally Skin abrasions (Laceration above left eye from unwitnessed fall at ALF)                    Personal Care Assistance Level of Assistance  Bathing, Feeding, Dressing Bathing Assistance: Limited assistance Feeding assistance: Independent Dressing Assistance: Limited assistance     Functional Limitations Info  Sight, Hearing Sight Info: Impaired Hearing Info: Impaired      SPECIAL CARE FACTORS FREQUENCY  PT (By licensed PT), OT (By licensed OT)     PT Frequency: Up to 5X per day, 5 days per week OT Frequency: Up to 5X per day, 5 days per week            Contractures Contractures Info: Present    Additional Factors Info  Code Status Code Status Info: DNR             Current Medications (12/08/2016):  This is the current hospital active medication list Current Facility-Administered Medications  Medication Dose Route Frequency Provider Last Rate Last Dose  . acetaminophen (TYLENOL) tablet 650 mg  650 mg Oral Q6H PRN Loletha Grayer, MD       Or  . acetaminophen (TYLENOL) suppository 650 mg  650 mg Rectal Q6H PRN Loletha Grayer, MD      . albuterol (PROVENTIL) (2.5 MG/3ML) 0.083% nebulizer solution 2.5 mg  2.5 mg Inhalation Q6H PRN Loletha Grayer, MD      . ALPRAZolam Duanne Moron) tablet 0.25 mg  0.25 mg Oral TID PRN Loletha Grayer, MD   0.25 mg at 12/08/16 1147  . amiodarone (PACERONE) tablet 100 mg  100 mg Oral Daily Loletha Grayer, MD  100 mg at 12/08/16 0920  . bacitracin ointment 1 application  1 application Topical IO-E7O Loletha Grayer, MD   1 application at 35/00/93 [phone]  . bicalutamide (CASODEX) tablet 50 mg  50 mg Oral Daily Loletha Grayer, MD   50 mg at 12/08/16 0920  . calcium carbonate (TUMS - dosed in mg elemental calcium) chewable tablet 200 mg of elemental calcium  1 tablet Oral Daily Loletha Grayer, MD   200 mg of elemental calcium at 12/08/16 0920  . cephALEXin (KEFLEX) capsule 500 mg  500 mg Oral Q8H Sital Mody, MD   500 mg at 12/08/16 1416  . divalproex (DEPAKOTE SPRINKLE) capsule 250 mg  250 mg Oral BID Loletha Grayer, MD   250 mg at  12/08/16 0920  . donepezil (ARICEPT) tablet 10 mg  10 mg Oral QPM Loletha Grayer, MD   10 mg at 12/07/16 1727  . ferrous sulfate tablet 325 mg  325 mg Oral Daily Loletha Grayer, MD   325 mg at 12/08/16 0920  . finasteride (PROSCAR) tablet 5 mg  5 mg Oral Daily Loletha Grayer, MD   5 mg at 12/08/16 0920  . levothyroxine (SYNTHROID, LEVOTHROID) tablet 50 mcg  50 mcg Oral QAC breakfast Loletha Grayer, MD   50 mcg at 12/08/16 0920  . Melatonin TABS 5 mg  5 mg Oral QHS Loletha Grayer, MD   5 mg at 12/07/16 2124  . multivitamin with minerals tablet 1 tablet  1 tablet Oral Daily Loletha Grayer, MD   1 tablet at 12/08/16 0920  . pantoprazole (PROTONIX) EC tablet 40 mg  40 mg Oral Daily Loletha Grayer, MD   40 mg at 12/08/16 0920  . rosuvastatin (CRESTOR) tablet 5 mg  5 mg Oral q1800 Loletha Grayer, MD   5 mg at 12/07/16 1727  . sertraline (ZOLOFT) tablet 50 mg  50 mg Oral QHS Loletha Grayer, MD   50 mg at 12/07/16 2124  . tamsulosin (FLOMAX) capsule 0.4 mg  0.4 mg Oral QPC supper Loletha Grayer, MD   0.4 mg at 12/07/16 1727     Discharge Medications: Please see discharge summary for a list of discharge medications.  Relevant Imaging Results:  Relevant Lab Results:   Additional Information SS# [id number]  Zettie Pho, LCSW

## 2016-12-09 DIAGNOSIS — R0902 Hypoxemia: Secondary | ICD-10-CM | POA: Diagnosis not present

## 2016-12-09 DIAGNOSIS — J189 Pneumonia, unspecified organism: Secondary | ICD-10-CM | POA: Diagnosis not present

## 2016-12-09 LAB — BASIC METABOLIC PANEL
Anion gap: 4 — ABNORMAL LOW (ref 5–15)
BUN: 12 mg/dL (ref 6–20)
CHLORIDE: 109 mmol/L (ref 101–111)
CO2: 22 mmol/L (ref 22–32)
Calcium: 8.1 mg/dL — ABNORMAL LOW (ref 8.9–10.3)
Creatinine, Ser: 1.01 mg/dL (ref 0.61–1.24)
GFR calc non Af Amer: >60 mL/min (ref >60)
Glucose, Bld: 88 mg/dL (ref 65–99)
Potassium: 3.6 mmol/L (ref 3.5–5.1)
SODIUM: 135 mmol/L (ref 135–145)

## 2016-12-09 NOTE — Plan of Care (Signed)
Problem: Education: Goal: Knowledge of [facility] Education information/materials will improve Outcome: Not Progressing Patient's dementia & advanced age make it difficult to understand these concepts. Adam Nielsen [facility]

## 2016-12-09 NOTE — Progress Notes (Signed)
Informed Dr. Benjie Karvonen that patient has refused all AM medications for no apparent reason via text page. Wenda Low [facility]

## 2016-12-09 NOTE — Progress Notes (Signed)
[place] [facility] [place] NAME: Adam Nielsen    MR#:  [id number]  DATE OF BIRTH:  1925-05-11  SUBJECTIVE:   Patient did not want to take meds this am  REVIEW OF SYSTEMS:    Review of Systems  Unable to perform ROS: Dementia  Respiratory: Positive for cough.   Skin:       Laceration above eye  Psychiatric/Behavioral: Positive for memory loss.    Tolerating Diet: yes      DRUG ALLERGIES:  No Known Allergies  VITALS:  Blood pressure 114/64, pulse 65, temperature 97.6 F (36.4 C), temperature source Oral, resp. rate 16, height 5\' 6"  (1.676 m), weight 63.5 kg (139 lb 14.4 oz), SpO2 95 %.  PHYSICAL EXAMINATION:   Physical Exam  Constitutional: He is well-developed, well-nourished, and in no distress. No distress.  HENT:  Head: Normocephalic.  Eyes: No scleral icterus.  Neck: Normal range of motion. Neck supple. No JVD present. No tracheal deviation present.  Cardiovascular: Normal rate and regular rhythm.  Exam reveals no gallop and no friction rub.   No murmur heard. Pulmonary/Chest: Effort normal. No respiratory distress. He has no wheezes. He has no rales. He exhibits no tenderness.  rhoncherous  Breath sounds b/l  Abdominal: Soft. Bowel sounds are normal. He exhibits no distension and no mass. There is no tenderness. There is no rebound and no guarding.  Musculoskeletal: Normal range of motion. He exhibits no edema.  Neurological: He is alert.  Skin: Skin is warm. No rash noted. No erythema.  bruising around eye better Laceration above left eye      LABORATORY PANEL:   CBC  Recent Labs Lab 12/07/16 0451  WBC 3.3*  HGB 11.2*  HCT 32.7*  PLT 105*   ------------------------------------------------------------------------------------------------------------------  Chemistries   Recent Labs Lab 12/06/16 1234  12/09/16 0719  NA 141  < > 135  K 3.5  < > 3.6  CL 107  < > 109  CO2 26  < > 22  GLUCOSE 96  < > 88   BUN 24*  < > 12  CREATININE 1.07  < > 1.01  CALCIUM 8.6*  < > 8.1*  AST 33  --   --   ALT 19  --   --   ALKPHOS 54  --   --   BILITOT 0.6  --   --   < > = values in this interval not displayed. ------------------------------------------------------------------------------------------------------------------  Cardiac Enzymes  Recent Labs Lab 12/06/16 1234  TROPONINI 0.03*   ------------------------------------------------------------------------------------------------------------------  RADIOLOGY:  No results found.   ASSESSMENT AND PLAN:   83 year old male with atrial fibrillation presents with weakness and found to have pneumonia  1. Acute respiratory failure with hypoxia: Patient was initially found to have oxygen saturation of 76% on room air. He is currently 94% on room air. CT scan of chest showed no evidence of pulmonary emboli.  2. Unwitnessed fall with laceration above left eye SNF at discharge  3. Klebsiella Urinary tract infection: Continue PO keflex as per Sensitivities.  4. Atrial fibrillation: Continue amiodarone Not on NOAC due to fall risk  5. Essential hypertension: Blood pressure still low normal and continue to hold blood pressure medictations  6. Hypothyroidism: Continue Synthroid  7. Hyperlipidemia: Continue Crestor  8. Depression: Continue Zoloft  9. Dementia: Continue Aricept  10. BPH: Continue Proscar  11. Hypokalemia: replete  CSW assisting with dispo to SNF  CODE STATUS: DNR  TOTAL TIME TAKING CARE  OF THIS PATIENT: 21 minutes.    POSSIBLE D/C SNF TOMORROW DEPENDING ON CLINICAL CONDITION.   Joory Gough M.D on 12/09/2016 at 11:08 AM  Between 7am to 6pm - Pager - [phone] After 6pm go to [email or web address] - password [email or web address] [facility]  Office  [phone]  CC: Primary care physician; Sarajane Jews, MD  Note: This dictation was prepared with Dragon dictation along with smaller phrase technology. Any  transcriptional errors that result from this process are unintentional.

## 2016-12-10 ENCOUNTER — Other Ambulatory Visit: Payer: Self-pay

## 2016-12-10 ENCOUNTER — Telehealth: Payer: Self-pay | Admitting: Cardiovascular Disease

## 2016-12-10 DIAGNOSIS — I455 Other specified heart block: Secondary | ICD-10-CM

## 2016-12-10 DIAGNOSIS — J189 Pneumonia, unspecified organism: Secondary | ICD-10-CM | POA: Diagnosis not present

## 2016-12-10 DIAGNOSIS — R0902 Hypoxemia: Secondary | ICD-10-CM | POA: Diagnosis not present

## 2016-12-10 LAB — BASIC METABOLIC PANEL
Anion gap: 5 (ref 5–15)
BUN: 14 mg/dL (ref 6–20)
CHLORIDE: 110 mmol/L (ref 101–111)
CO2: 22 mmol/L (ref 22–32)
Calcium: 8.3 mg/dL — ABNORMAL LOW (ref 8.9–10.3)
Creatinine, Ser: 0.93 mg/dL (ref 0.61–1.24)
GFR calc Af Amer: >60 mL/min (ref >60)
GFR calc non Af Amer: >60 mL/min (ref >60)
GLUCOSE: 92 mg/dL (ref 65–99)
POTASSIUM: 3.6 mmol/L (ref 3.5–5.1)
Sodium: 137 mmol/L (ref 135–145)

## 2016-12-10 LAB — CBC
HEMATOCRIT: 31.4 % — AB (ref 40.0–52.0)
HEMOGLOBIN: 10.9 g/dL — AB (ref 13.0–18.0)
MCH: 31.2 pg (ref 26.0–34.0)
MCHC: 34.5 g/dL (ref 32.0–36.0)
MCV: 90.2 fL (ref 80.0–100.0)
Platelets: 101 10*3/uL — ABNORMAL LOW (ref 150–440)
RBC: 3.48 MIL/uL — AB (ref 4.40–5.90)
RDW: 14.2 % (ref 11.5–14.5)
WBC: 4.2 10*3/uL (ref 3.8–10.6)

## 2016-12-10 MED ORDER — ALPRAZOLAM 0.25 MG PO TABS: 0.2500 mg | ORAL_TABLET | Freq: Three times a day (TID) | ORAL | 0 refills | Status: AC | PRN

## 2016-12-10 MED ORDER — CEPHALEXIN 500 MG PO CAPS: 500.0000 mg | ORAL_CAPSULE | Freq: Three times a day (TID) | ORAL | 0 refills | Status: DC

## 2016-12-10 NOTE — Progress Notes (Signed)
Card eve

## 2016-12-10 NOTE — Progress Notes (Signed)
Report called to [place]. Patient in no distress at this time. Awaiting son to transport patient to facility.

## 2016-12-10 NOTE — Progress Notes (Signed)
IV and tele removed from patient. Report called to SNF. Son at bedside and is feeding patient then will transport patient to facility. Patient in no distress at this time.

## 2016-12-10 NOTE — Plan of Care (Signed)
Problem: Safety: Goal: Ability to remain free from injury will improve Outcome: Progressing Bed alarm on. Hourly rounding completed on patient.   Problem: Health Behavior/Discharge Planning: Goal: Ability to manage health-related needs will improve Outcome: Not Progressing Patient unable to verbalize self care needs and requires assistance with meals.   Problem: Pain Managment: Goal: General experience of comfort will improve Outcome: Progressing Patient denies and pain.

## 2016-12-10 NOTE — Telephone Encounter (Signed)
Received call from Dr. Velta Addison that pt is currently a resident at [facility], recently seen in ED. She would like to know if pt's amiodarone dose needs to be adjusted 2/2 frequent falls. Advised her that pt has not been seen since May 2017 and his ED visits have all been since that time. He was advised to f/u in 6 months, but did not do so.  Pt is sched to see Dr. Rockey Situ on 4/10; offered appt this Thurs, but pt's son brings him to appts and will need more notice.

## 2016-12-10 NOTE — Care Management Important Message (Signed)
Important Message  Patient Details  Name: Adam Nielsen MRN: [id number] Date of Birth: 11/18/1924   Medicare Important Message Given:  Yes    Katrina Stack, RN 12/10/2016, 2:28 PM

## 2016-12-10 NOTE — Progress Notes (Signed)
Patient had 2.21 second pause. MD notified.

## 2016-12-10 NOTE — Discharge Summary (Addendum)
[facility] NAME: Adam Nielsen    MR#:  [id number]  DATE OF BIRTH:  May 03, 1925  DATE OF ADMISSION:  12/06/2016 ADMITTING PHYSICIAN: Loletha Grayer, MD  DATE OF DISCHARGE: 12/10/2016  PRIMARY CARE PHYSICIAN: Sarajane Jews, MD    ADMISSION DIAGNOSIS:  Hypoxia [R09.02]  DISCHARGE DIAGNOSIS:  Active Problems:   Hypoxia    UTI   Fall  SECONDARY DIAGNOSIS:   Past Medical History:  Diagnosis Date  . Acute bronchitis 11/22/2015   . BPH (benign prostatic hypertrophy)   . CAD (coronary artery disease)    a. s/p CABG approx 1997; b. patient of Dr. Wynonia Lawman, MD  . Chronic indwelling Foley catheter   . Dementia   . Dementia   . Fall    11/22/2015    . Hyperlipidemia   . Hypothyroid   . PAF (paroxysmal atrial fibrillation) (HCC)    a. not on long term full dose anticoagulation  . Prostate CA ([facility])   . Urinary tract infection 11/22/2015     HOSPITAL COURSE:   83 year old male with atrial fibrillation presents with weakness and found to have pneumonia  1. Acute respiratory failure with hypoxia: Patient was initially found to have oxygen saturation of 76% on room air. He is currently 94% on room air. CT scan of chest showed no evidence of pulmonary emboli.  2. Unwitnessed fall with laceration above left eye SNF at discharge  3. Klebsiella Urinary tract infection: Continue PO keflex as per Sensitivities.  4. Atrial fibrillation: Continue amiodarone Not on NOAC due to fall risk  5. Essential hypertension: Blood pressure still low normal and continue to hold lisinopril.  6. Hypothyroidism: Continue Synthroid  7. Hyperlipidemia: Continue Crestor  8. Depression: Continue Zoloft  9. Dementia: Continue Aricept  10. BPH: Continue Proscar  11. Hypokalemia: replete  CSW assisting with dispo to SNF  12. Pause noted on telemetry for 2 second.   Spoke to his primary cardiologist, they will arrange for event  monitor and follow as out pt.  DISCHARGE CONDITIONS:   Stable.  CONSULTS OBTAINED:  Treatment Team:  Vaughan Basta, MD  DRUG ALLERGIES:  No Known Allergies  DISCHARGE MEDICATIONS:   Current Discharge Medication List    START taking these medications   Details  cephALEXin (KEFLEX) 500 MG capsule Take 1 capsule (500 mg total) by mouth every 8 (eight) hours. Qty: 12 capsule, Refills: 0      CONTINUE these medications which have CHANGED   Details  ALPRAZolam (XANAX) 0.25 MG tablet Take 1 tablet (0.25 mg total) by mouth 3 (three) times daily as needed for anxiety. For agitation Qty: 30 tablet, Refills: 0      CONTINUE these medications which have NOT CHANGED   Details  acetaminophen (TYLENOL) 500 MG tablet Take 500 mg by mouth every 4 (four) hours as needed for mild pain or moderate pain.     albuterol (PROVENTIL HFA;VENTOLIN HFA) 108 (90 Base) MCG/ACT inhaler Inhale 2 puffs into the lungs every 6 (six) hours as needed for wheezing or shortness of breath. Qty: 1 Inhaler, Refills: 0    amiodarone (PACERONE) 200 MG tablet Take 100 mg by mouth daily.    bacitracin 500 UNIT/GM ointment Apply 1 application topically every morning. To wound on upper arm and forearm    bicalutamide (CASODEX) 50 MG tablet Take 50 mg by mouth daily. Reported on 12/13/2015    Calcium Carbonate-Vit D-Min (CALTRATE 600+D PLUS MINERALS) 600-800 MG-UNIT CHEW Chew  2 tablets by mouth daily.    divalproex (DEPAKOTE SPRINKLE) 125 MG capsule Take 250 mg by mouth 2 (two) times daily.    donepezil (ARICEPT) 10 MG tablet Take 10 mg by mouth every evening.     ferrous sulfate 325 (65 FE) MG tablet Take 1 tablet (325 mg total) by mouth 2 (two) times daily with a meal. Qty: 180 tablet, Refills: 0    finasteride (PROSCAR) 5 MG tablet Take 5 mg by mouth daily.    furosemide (LASIX) 20 MG tablet Take 1 tablet (20 mg total) by mouth daily. Qty: 30 tablet, Refills: 0    guaifenesin (ROBITUSSIN) 100 MG/5ML  syrup Take 200 mg by mouth every 6 (six) hours as needed for cough.    levothyroxine (SYNTHROID, LEVOTHROID) 50 MCG tablet Take 50 mcg by mouth daily before breakfast.    magnesium hydroxide (MILK OF MAGNESIA) 400 MG/5ML suspension Take 30 mLs by mouth at bedtime as needed for mild constipation.    Melatonin 5 MG TABS Take 5 mg by mouth at bedtime.    Menthol-Zinc Oxide (RISAMINE) 0.44-20.625 % OINT Apply 1 application topically 2 (two) times daily as needed. For rash    Multiple Vitamin (MULTIVITAMIN WITH MINERALS) TABS tablet Take 1 tablet by mouth daily.    nitrofurantoin (MACRODANTIN) 100 MG capsule Take 100 mg by mouth 2 (two) times daily. For 5 days    Omega-3 Fatty Acids (FISH OIL) 1000 MG CAPS Take 1,000 mg by mouth daily.    pantoprazole (PROTONIX) 40 MG tablet Take 1 tablet (40 mg total) by mouth daily. Qty: 30 tablet, Refills: 0    rosuvastatin (CRESTOR) 5 MG tablet Take 1 tablet (5 mg total) by mouth daily at 6 PM. Qty: 30 tablet, Refills: 0    sertraline (ZOLOFT) 50 MG tablet Take 50 mg by mouth at bedtime.    tamsulosin (FLOMAX) 0.4 MG CAPS capsule Take 1 capsule (0.4 mg total) by mouth daily after supper. Qty: 30 capsule, Refills: 0      STOP taking these medications     lisinopril (PRINIVIL,ZESTRIL) 2.5 MG tablet          DISCHARGE INSTRUCTIONS:    Follow with PMD in 1-2 weeks.  If you experience worsening of your admission symptoms, develop shortness of breath, life threatening emergency, suicidal or homicidal thoughts you must seek medical attention immediately by calling 911 or calling your MD immediately  if symptoms less severe.  You Must read complete instructions/literature along with all the possible adverse reactions/side effects for all the Medicines you take and that have been prescribed to you. Take any new Medicines after you have completely understood and accept all the possible adverse reactions/side effects.   Please note  You were cared  for by a hospitalist during your hospital stay. If you have any questions about your discharge medications or the care you received while you were in the hospital after you are discharged, you can call the unit and asked to speak with the hospitalist on call if the hospitalist that took care of you is not available. Once you are discharged, your primary care physician will handle any further medical issues. Please note that NO REFILLS for any discharge medications will be authorized once you are discharged, as it is imperative that you return to your primary care physician (or establish a relationship with a primary care physician if you do not have one) for your aftercare needs so that they can reassess your need for medications and  monitor your lab values.    Today   CHIEF COMPLAINT:   Chief Complaint  Patient presents with  . Fall    HISTORY OF PRESENT ILLNESS:  Adam Nielsen  is a 83 y.o. male presents with an unwitnessed fall and having a laceration over the left eye. The son who is at the bedside was called around 8 AM that he had an unwitnessed fall and had a gash over his forehead. He states that the doctor said he had recent low blood pressure. Looks like he is also been on Macrobid for recent urinary tract infection. He's been at [facility] assisted living for over one year. He's had numerous ER visits for falls. The son is at the bedside said he hasn't been as responsive in the ER as he normally is. Normally he is able to answer questions. I was able to ask him some questions and get him to move all of his extremities and follows some commands.    VITAL SIGNS:  Blood pressure (!) 95/58, pulse 62, temperature 98.3 F (36.8 C), resp. rate 16, height 5\' 6"  (1.676 m), weight 63.5 kg (139 lb 14.4 oz), SpO2 96 %.  I/O:    Intake/Output Summary (Last 24 hours) at 12/10/16 1022 Last data filed at 12/09/16 1700  Gross per 24 hour  Intake              120 ml  Output                0  ml  Net              120 ml    PHYSICAL EXAMINATION:   Constitutional: He is well-developed, well-nourished, and in no distress. No distress.  HENT:  Head: Normocephalic.  Eyes: No scleral icterus.  Neck: Normal range of motion. Neck supple. No JVD present. No tracheal deviation present.  Cardiovascular: Normal rate and regular rhythm.  Exam reveals no gallop and no friction rub.   No murmur heard. Pulmonary/Chest: Effort normal. No respiratory distress. He has no wheezes. He has no rales. He exhibits no tenderness.  rhoncherous  Breath sounds b/l  Abdominal: Soft. Bowel sounds are normal. He exhibits no distension and no mass. There is no tenderness. There is no rebound and no guarding.  Musculoskeletal: Normal range of motion. He exhibits no edema.  Neurological: He is alert.  Skin: Skin is warm. No rash noted. No erythema.  bruising around eye better Laceration above left eye   DATA REVIEW:   CBC  Recent Labs Lab 12/10/16 0538  WBC 4.2  HGB 10.9*  HCT 31.4*  PLT 101*    Chemistries   Recent Labs Lab 12/06/16 1234  12/10/16 0538  NA 141  < > 137  K 3.5  < > 3.6  CL 107  < > 110  CO2 26  < > 22  GLUCOSE 96  < > 92  BUN 24*  < > 14  CREATININE 1.07  < > 0.93  CALCIUM 8.6*  < > 8.3*  AST 33  --   --   ALT 19  --   --   ALKPHOS 54  --   --   BILITOT 0.6  --   --   < > = values in this interval not displayed.  Cardiac Enzymes  Recent Labs Lab 12/06/16 1234  TROPONINI 0.03*    Microbiology Results  Results for orders placed or performed during the hospital encounter of 12/06/16  Urine  culture     Status: Abnormal   Collection Time: 12/06/16  4:17 PM  Result Value Ref Range Status   Specimen Description URINE, RANDOM  Final   Special Requests NONE  Final   Culture >=100,000 COLONIES/mL KLEBSIELLA PNEUMONIAE (A)  Final   Report Status 12/08/2016 FINAL  Final   Organism ID, Bacteria KLEBSIELLA PNEUMONIAE (A)  Final      Susceptibility   Klebsiella  pneumoniae - MIC*    AMPICILLIN >=32 RESISTANT Resistant     CEFAZOLIN <=4 SENSITIVE Sensitive     CEFTRIAXONE <=1 SENSITIVE Sensitive     CIPROFLOXACIN <=0.25 SENSITIVE Sensitive     GENTAMICIN <=1 SENSITIVE Sensitive     IMIPENEM <=0.25 SENSITIVE Sensitive     NITROFURANTOIN <=16 SENSITIVE Sensitive     TRIMETH/SULFA <=20 SENSITIVE Sensitive     AMPICILLIN/SULBACTAM <=2 SENSITIVE Sensitive     PIP/TAZO <=4 SENSITIVE Sensitive     Extended ESBL NEGATIVE Sensitive     * >=100,000 COLONIES/mL KLEBSIELLA PNEUMONIAE  MRSA PCR Screening     Status: None   Collection Time: 12/06/16  5:13 PM  Result Value Ref Range Status   MRSA by PCR NEGATIVE NEGATIVE Final    Comment:        The GeneXpert MRSA Assay (FDA approved for NASAL specimens only), is one component of a comprehensive MRSA colonization surveillance program. It is not intended to diagnose MRSA infection nor to guide or monitor treatment for MRSA infections.   Gastrointestinal Panel by PCR , Stool     Status: None   Collection Time: 12/06/16  9:00 PM  Result Value Ref Range Status   Campylobacter species NOT DETECTED NOT DETECTED Final   Plesimonas shigelloides NOT DETECTED NOT DETECTED Final   Salmonella species NOT DETECTED NOT DETECTED Final   Yersinia enterocolitica NOT DETECTED NOT DETECTED Final   Vibrio species NOT DETECTED NOT DETECTED Final   Vibrio cholerae NOT DETECTED NOT DETECTED Final   Enteroaggregative E coli (EAEC) NOT DETECTED NOT DETECTED Final   Enteropathogenic E coli (EPEC) NOT DETECTED NOT DETECTED Final   Enterotoxigenic E coli (ETEC) NOT DETECTED NOT DETECTED Final   Shiga like toxin producing E coli (STEC) NOT DETECTED NOT DETECTED Final   Shigella/Enteroinvasive E coli (EIEC) NOT DETECTED NOT DETECTED Final   Cryptosporidium NOT DETECTED NOT DETECTED Final   Cyclospora cayetanensis NOT DETECTED NOT DETECTED Final   Entamoeba histolytica NOT DETECTED NOT DETECTED Final   Giardia lamblia NOT  DETECTED NOT DETECTED Final   Adenovirus F40/41 NOT DETECTED NOT DETECTED Final   Astrovirus NOT DETECTED NOT DETECTED Final   Norovirus GI/GII NOT DETECTED NOT DETECTED Final   Rotavirus A NOT DETECTED NOT DETECTED Final   Sapovirus (I, II, IV, and V) NOT DETECTED NOT DETECTED Final  C difficile quick scan w PCR reflex     Status: None   Collection Time: 12/06/16  9:00 PM  Result Value Ref Range Status   C Diff antigen NEGATIVE NEGATIVE Final   C Diff toxin NEGATIVE NEGATIVE Final   C Diff interpretation No C. difficile detected.  Final    RADIOLOGY:  No results found.  EKG:   Orders placed or performed during the hospital encounter of 12/06/16  . ED EKG  . ED EKG      Management plans discussed with the patient, family and they are in agreement.  CODE STATUS:     Code Status Orders        Start  Ordered   12/06/16 1556  Do not attempt resuscitation (DNR)  Continuous    Question Answer Comment  In the event of cardiac or respiratory ARREST Do not call a "code blue"   In the event of cardiac or respiratory ARREST Do not perform Intubation, CPR, defibrillation or ACLS   In the event of cardiac or respiratory ARREST Use medication by any route, position, wound care, and other measures to relive pain and suffering. May use oxygen, suction and manual treatment of airway obstruction as needed for comfort.   Comments nurse may pronounce      12/06/16 1556    Code Status History    Date Active Date Inactive Code Status Order ID Comments User Context   03/20/2016 12:29 AM 03/22/2016  6:44 PM DNR [id number]  Lance Coon, MD Inpatient   12/14/2015  1:06 PM 12/21/2015  7:37 PM DNR [id number]  Gladstone Lighter, MD ED   12/14/2015  4:26 AM 12/14/2015  1:06 PM Full Code [id number]  Harrie Foreman, MD ED   12/12/2015 12:00 PM 12/13/2015  4:35 PM Full Code [id number]  Kathie Rhodes, MD Inpatient   11/22/2015 11:40 PM 11/25/2015  2:43 PM Full Code [id number]  Lance Coon, MD Inpatient     Advance Directive Documentation     Most Recent Value  Type of Advance Directive  Healthcare Power of Attorney  Pre-existing out of facility DNR order (yellow form or pink MOST form)  -  "MOST" Form in Place?  -      TOTAL TIME TAKING CARE OF THIS PATIENT: 35 minutes.    Vaughan Basta M.D on 12/10/2016 at 10:22 AM  Between 7am to 6pm - Pager - [phone]  After 6pm go to [email or web address] - password [email or web address] [facility]  Office  [phone]  CC: Primary care physician; Sarajane Jews, MD   Note: This dictation was prepared with Dragon dictation along with smaller phrase technology. Any transcriptional errors that result from this process are unintentional.

## 2016-12-10 NOTE — Progress Notes (Signed)
Pt. Had 2.10 pause. Pt. Alert and oriented with no signs or c/o pain, SOB or acute distress noted. MD aware, no new orders at this time.

## 2016-12-10 NOTE — Progress Notes (Signed)
    Talked with IM regarding asymptomatic pause of 2 seconds. Will have office order event monitor. IM does not need [place] to see inpatient.

## 2016-12-10 NOTE — Clinical Social Work Note (Addendum)
Patient to be d/c'ed today to [place].  Patient and family agreeable to plans will transport via son's car RN to call report room [phone].  Patient's son [name] [phone] said he will be at hospital after 5pm to transport patient to [facility].  Evette Cristal, MSW, [place]

## 2016-12-15 ENCOUNTER — Encounter (INDEPENDENT_AMBULATORY_CARE_PROVIDER_SITE_OTHER): Payer: Medicare Other

## 2016-12-15 DIAGNOSIS — I455 Other specified heart block: Secondary | ICD-10-CM

## 2016-12-17 NOTE — Progress Notes (Signed)
   12/07/16 1342  PT Time Calculation  PT Start Time (ACUTE ONLY) 1150  PT Stop Time (ACUTE ONLY) 1216  PT Time Calculation (min) (ACUTE ONLY) 26 min  PT G-Codes **NOT FOR INPATIENT CLASS**  Functional Assessment Tool Used AM-PAC 6 Clicks Basic Mobility  Functional Limitation Mobility: Walking and moving around  Mobility: Walking and Moving Around Current Status (N8676) CN  Mobility: Walking and Moving Around Goal Status (H2094) CK  Mobility: Walking and Moving Around Discharge Status (B0962) CN  PT General Charges  $$ ACUTE PT VISIT 1 Procedure  PT Evaluation  $PT Eval Low Complexity 1 Procedure  PT Treatments  $Therapeutic Activity 8-22 mins   Late entry G-codes added after review of initial evaluation.  Leitha Bleak, PT 12/17/16, 9:35 AM [phone]

## 2016-12-22 NOTE — Progress Notes (Signed)
Cardiology Office Note  Date:  12/25/2016   ID:  Adam Nielsen, DOB [birth year], MRN [id number]  PCP:  Adam Jews, MD   Chief Complaint  Patient presents with  . other    Follow up from visit in [facility].  Patient is wearing an event monitor.  Meds verbally reviewed with patient.      HPI:  Adam Nielsen is a 83 y.o. male with history of  dementia, Lives at [facility] assisted living Paroxysmal atrial fibrillation CAD s/p 4 or 5 vessel CABG approximately 1997,   Ejection fraction 35-40% with hypokinesis anterior and anteroseptal wall, basal to mid inferior and posterior wall chronic respiratory failure with hypoxia on home oxygen via nasal cannula, PAF previously on Eliquis 2.5 mg bid, held for risk of falls, dementia,  metastatic prostate cancer s/p TURP 12/12/2015, BPH,   hematuria,   ICM, NSTEMI medically managed with IV heparin, Bradycardia noted in the past who presents for follow-up of his coronary artery disease, systolic CHF  visiting doctors 2 days per week to help manage his medications  Low blood pressure seen by nursing hospital admission 11/2016, hypoxia, 76% on room air Hospital records reviewed with the patient in detail Pneumonia, Klebsiella Urinary tract infection Unwitnessed fall with laceration above left eye Finished rehab,  Blood pressure still running low, also low today in the office  New atrial fibrillation seen In the hospital Was not started on anticoagulation given repeated falls Son reports that he is not drinking much fluids given his dementia  9 visits to the ER for fall in the past year  Not walking as well   working with PT.  EKG on today's visit person only reviewed by myself showing atrial fibrillation ventricular rate 77 bpm nonspecific T wave abnormality  Other past medical history PAF of unknown duration and was not previously on full-dose anticoagulation prior to the above admission.  Patient was living on his own up until  09/2015. Marland Kitchen At that time it was discovered he was becoming more forgetful and not taking his medications as directed. At that time he was placed in [facility]. His cognitive function continues to decline.   He recently underwent TURP at [facility] on 3/27 without issues. He was discharged back to [facility]. On 3/28 he was standing at his doorway at his room when he suddenly felt a "jolt." he next reported being found down on the ground by the staff that works there. Upon his arrival at [facility] he was found to have an elevated troponin with a peak of 4.80, CK 408, hgb 11.9, which was stable. He noted significant hematuria in the setting of his recent TURP/Foley. EKG showed lateral st depression. There was discussion regarding cardiac vs no invasive work up. Ultimately, it was decided to manage medically given his dementia and other comorbidities with IV heparin gtt. Echo showed EF 35-40%, HK of the anterior and anteroseptal wall, basal to mid inferior/posterior wall HK. Mild MR, PASP 38 mm Hg. He was started on amiodarone and Eliquis during admission for his Afib.   PMH:   has a past medical history of Acute bronchitis (11/22/2015 ); BPH (benign prostatic hypertrophy); CAD (coronary artery disease); Chronic indwelling Foley catheter; Dementia; Dementia; Fall; Hyperlipidemia; Hypothyroid; PAF (paroxysmal atrial fibrillation) ([place]); Prostate CA ([place]); and Urinary tract infection (11/22/2015 ).  PSH:    Past Surgical History:  Procedure Laterality Date  . ANKLE SURGERY    . BONE GRAFT HIP ILIAC CREST    .  CORONARY ARTERY BYPASS GRAFT    . TRANSURETHRAL RESECTION OF PROSTATE N/A 12/12/2015   Procedure: TRANSURETHRAL RESECTION OF THE PROSTATE ;  Surgeon: Kathie Rhodes, MD;  Location: [facility] ORS;  Service: Urology;  Laterality: N/A;    Current Outpatient Prescriptions  Medication Sig Dispense Refill  . acetaminophen (TYLENOL) 500 MG tablet Take 500 mg by mouth every 4 (four) hours as needed for mild  pain or moderate pain.     Marland Kitchen albuterol (PROVENTIL HFA;VENTOLIN HFA) 108 (90 Base) MCG/ACT inhaler Inhale 2 puffs into the lungs every 6 (six) hours as needed for wheezing or shortness of breath. 1 Inhaler 0  . ALPRAZolam (XANAX) 0.25 MG tablet Take 1 tablet (0.25 mg total) by mouth 3 (three) times daily as needed for anxiety. For agitation 30 tablet 0  . amiodarone (PACERONE) 200 MG tablet Take 100 mg by mouth daily.    . bacitracin 500 UNIT/GM ointment Apply 1 application topically every morning. To wound on upper arm and forearm    . bicalutamide (CASODEX) 50 MG tablet Take 50 mg by mouth daily. Reported on 12/13/2015    . Calcium Carbonate-Vit D-Min (CALTRATE 600+D PLUS MINERALS) 600-800 MG-UNIT CHEW Chew 2 tablets by mouth daily.    . divalproex (DEPAKOTE SPRINKLE) 125 MG capsule Take 250 mg by mouth 2 (two) times daily.    Marland Kitchen donepezil (ARICEPT) 10 MG tablet Take 10 mg by mouth every evening.     . ferrous sulfate 325 (65 FE) MG tablet Take 1 tablet (325 mg total) by mouth 2 (two) times daily with a meal. (Patient taking differently: Take 325 mg by mouth daily. ) 180 tablet 0  . finasteride (PROSCAR) 5 MG tablet Take 5 mg by mouth daily.    . furosemide (LASIX) 20 MG tablet Take 1 tablet (20 mg total) by mouth daily. 30 tablet 0  . guaifenesin (ROBITUSSIN) 100 MG/5ML syrup Take 200 mg by mouth every 6 (six) hours as needed for cough.    . levothyroxine (SYNTHROID, LEVOTHROID) 50 MCG tablet Take 50 mcg by mouth daily before breakfast.    . magnesium hydroxide (MILK OF MAGNESIA) 400 MG/5ML suspension Take 30 mLs by mouth at bedtime as needed for mild constipation.    . Melatonin 5 MG TABS Take 5 mg by mouth at bedtime.    . Menthol-Zinc Oxide (RISAMINE) 0.44-20.625 % OINT Apply 1 application topically 2 (two) times daily as needed. For rash    . Multiple Vitamin (MULTIVITAMIN WITH MINERALS) TABS tablet Take 1 tablet by mouth daily.    . Omega-3 Fatty Acids (FISH OIL) 1000 MG CAPS Take 1,000 mg by  mouth daily.    . pantoprazole (PROTONIX) 40 MG tablet Take 1 tablet (40 mg total) by mouth daily. 30 tablet 0  . rosuvastatin (CRESTOR) 5 MG tablet Take 1 tablet (5 mg total) by mouth daily at 6 PM. 30 tablet 0  . sertraline (ZOLOFT) 50 MG tablet Take 50 mg by mouth at bedtime.    . tamsulosin (FLOMAX) 0.4 MG CAPS capsule Take 1 capsule (0.4 mg total) by mouth daily after supper. 30 capsule 0   No current facility-administered medications for this visit.      Allergies:   Patient has no known allergies.   Social History:  The patient  reports that he has quit smoking. He has never used smokeless tobacco. He reports that he does not drink alcohol or use drugs.   Family History:   family history includes Cancer in his brother;  Heart disease in his father.    Review of Systems: Review of Systems  Unable to perform ROS: Dementia     PHYSICAL EXAM: VS:  BP (!) 92/54 (BP Location: Left Arm, Patient Position: Sitting, Cuff Size: Normal)   Pulse 83   Ht 5\' 6"  (1.676 m)   Wt 139 lb (63 kg)   BMI 22.44 kg/m  , BMI Body mass index is 22.44 kg/m. GEN: Well nourished, well developed, in no acute distress  HEENT: normal  Neck: no JVD, carotid bruits, or masses Cardiac:Irregularly irregular,  no murmurs, rubs, or gallops,no edema  Respiratory:  clear to auscultation bilaterally, normal work of breathing GI: soft, nontender, nondistended, + BS MS: no deformity or atrophy  Skin: warm and dry, no rash Neuro:  Not fully tested, presents in a wheelchair Psych: Alert, nonverbal    Recent Labs: 12/06/2016: ALT 19; B Natriuretic Peptide 338.0 12/07/2016: TSH 2.382 12/10/2016: BUN 14; Creatinine, Ser 0.93; Hemoglobin 10.9; Platelets 101; Potassium 3.6; Sodium 137    Lipid Panel Lab Results  Component Value Date   CHOL 128 12/15/2015   HDL 27 (L) 12/15/2015   LDLCALC 76 12/15/2015   TRIG 126 12/15/2015      Wt Readings from Last 3 Encounters:  12/25/16 139 lb (63 kg)  12/06/16 139  lb 14.4 oz (63.5 kg)  06/22/16 155 lb (70.3 kg)       ASSESSMENT AND PLAN:  Persistent atrial fibrillation ([place]) - Plan: EKG 12-Lead Recent hospital records reviewed in detail Not on anticoagulation Adequate rate on today's visit We'll stop the amiodarone Risk and benefit of anticoagulation discussed with patient's son. High fall risk   Fall, subsequent encounter Etiology of his falls likely multifactorial though very concerning in the setting of low blood pressure Dramatic weight loss over the past year likely contributing We have recommended decreasing Lasix to every other day We will add midodrine 10 mg at 8 AM 2 PM for blood pressure support so he can participate in physical therapy  S/P CABG (coronary artery bypass graft) Currently with no symptoms of angina. No further workup at this time. Continue current medication regimen.  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 EKG 12-Lead     Signed, Esmond Plants, M.D., Ph.D. 12/25/2016  [place], [place]

## 2016-12-25 ENCOUNTER — Encounter: Payer: Self-pay | Admitting: Cardiovascular Disease

## 2016-12-25 ENCOUNTER — Ambulatory Visit (INDEPENDENT_AMBULATORY_CARE_PROVIDER_SITE_OTHER): Payer: Medicare Other | Admitting: Cardiovascular Disease

## 2016-12-25 VITALS — BP 92/54 | HR 83 | Ht 66.0 in | Wt 139.0 lb

## 2016-12-25 DIAGNOSIS — I214 Non-ST elevation (NSTEMI) myocardial infarction: Secondary | ICD-10-CM

## 2016-12-25 DIAGNOSIS — Z951 Presence of aortocoronary bypass graft: Secondary | ICD-10-CM | POA: Diagnosis not present

## 2016-12-25 DIAGNOSIS — I483 Typical atrial flutter: Secondary | ICD-10-CM

## 2016-12-25 DIAGNOSIS — I481 Persistent atrial fibrillation: Secondary | ICD-10-CM | POA: Diagnosis not present

## 2016-12-25 DIAGNOSIS — R0902 Hypoxemia: Secondary | ICD-10-CM

## 2016-12-25 DIAGNOSIS — F0390 Unspecified dementia without behavioral disturbance: Secondary | ICD-10-CM | POA: Diagnosis not present

## 2016-12-25 DIAGNOSIS — I4819 Other persistent atrial fibrillation: Secondary | ICD-10-CM

## 2016-12-25 DIAGNOSIS — W19XXXD Unspecified fall, subsequent encounter: Secondary | ICD-10-CM

## 2016-12-25 DIAGNOSIS — N390 Urinary tract infection, site not specified: Secondary | ICD-10-CM | POA: Diagnosis not present

## 2016-12-25 MED ORDER — MIDODRINE HCL 10 MG PO TABS: 10.0000 mg | ORAL_TABLET | Freq: Two times a day (BID) | ORAL | 0 refills | Status: AC

## 2016-12-25 MED ORDER — POTASSIUM CHLORIDE ER 10 MEQ PO TBCR: 10.0000 meq | EXTENDED_RELEASE_TABLET | ORAL | 3 refills | Status: AC

## 2016-12-25 MED ORDER — FUROSEMIDE 20 MG PO TABS: 20.0000 mg | ORAL_TABLET | ORAL | 0 refills | Status: AC

## 2016-12-25 NOTE — Patient Instructions (Addendum)
Medication Instructions:   Please stop the amiodarone  Please decrease lasix to every other day Please add potassium (Kdur) 10 meq every other day  Please start midodrine 10 mg at 8AM and 2 pm  Please measure blood pressure daily  FAX them to the office [phone]) in 2 weeks  Labwork:  No new labs needed  Testing/Procedures:  No further testing at this time   I recommend watching educational videos on topics of interest to you at:       [email or web address]  Enter code: HEARTCARE    Follow-Up: It was a pleasure seeing you in the office today. Please call us if you have new issues that need to be addressed before your next appt.  [phone]  Your physician wants you to follow-up in: 3 months.   If you need a refill on your cardiac medications before your next appointment, please call your pharmacy.

## 2017-01-10 ENCOUNTER — Telehealth: Payer: Self-pay | Admitting: *Deleted

## 2017-01-10 NOTE — Telephone Encounter (Signed)
Called and let patient's son know that he can discontinue the monitor and mail it back in provided box with all the pieces. 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 He will go and gather whatever pieces he has left and take it back at his earliest convenience. He was very relieved that his dad did not have to continue wearing it.

## 2017-01-10 NOTE — Telephone Encounter (Signed)
Called [employer] and spoke with representative and she will make note that device wear is ending early. Also, let her know that some equipment may be lost. She said it would depend on what was lost as to what will need to be done.

## 2017-01-10 NOTE — Telephone Encounter (Signed)
Received notification from [facility] saying "[employer] has not received transmissions for the patient referenced above for more than 2 days. We have made several attempts to contact the patient however, at this time we have limited data to report to you."  Called and spoke with patient's son, listed on DPR. Patient is at [facility]. It has been a challenge working with the staff there to keep the monitor charged and keep the pieces from going missing. The son has tried to help but cannot go up there physically everyday to help. He said he was there last Sunday and the monitor was not on the patient but was laying uncharged on the night stand. [employer] monitor was started on patient on 12/15/16 and the 30-days will be complete on 02/13/17.  Spoke with Christell Faith, PA regarding this. He advised it was ok for the patient to remove monitor and go ahead and mail it back to [employer] as we should have enough data collected to review.

## 2017-03-25 ENCOUNTER — Ambulatory Visit: Payer: Medicare Other | Admitting: Cardiovascular Disease

## 2017-06-20 ENCOUNTER — Emergency Department
Admission: EM | Admit: 2017-06-20 | Discharge: 2017-06-20 | Disposition: A | Discharge status: Home / Self Care | Payer: Medicare Other | Attending: Emergency Medicine | Admitting: Emergency Medicine

## 2017-06-20 ENCOUNTER — Emergency Department: Payer: Medicare Other

## 2017-06-20 DIAGNOSIS — Z951 Presence of aortocoronary bypass graft: Secondary | ICD-10-CM | POA: Diagnosis not present

## 2017-06-20 DIAGNOSIS — H1131 Conjunctival hemorrhage, right eye: Secondary | ICD-10-CM | POA: Diagnosis not present

## 2017-06-20 DIAGNOSIS — E039 Hypothyroidism, unspecified: Secondary | ICD-10-CM | POA: Diagnosis not present

## 2017-06-20 DIAGNOSIS — Z23 Encounter for immunization: Secondary | ICD-10-CM | POA: Insufficient documentation

## 2017-06-20 DIAGNOSIS — D649 Anemia, unspecified: Secondary | ICD-10-CM | POA: Insufficient documentation

## 2017-06-20 DIAGNOSIS — Z87891 Personal history of nicotine dependence: Secondary | ICD-10-CM | POA: Insufficient documentation

## 2017-06-20 DIAGNOSIS — S50901A Unspecified superficial injury of right elbow, initial encounter: Secondary | ICD-10-CM | POA: Diagnosis not present

## 2017-06-20 DIAGNOSIS — T148XXA Other injury of unspecified body region, initial encounter: Secondary | ICD-10-CM

## 2017-06-20 DIAGNOSIS — Y92129 Unspecified place in nursing home as the place of occurrence of the external cause: Secondary | ICD-10-CM | POA: Diagnosis not present

## 2017-06-20 DIAGNOSIS — S50912A Unspecified superficial injury of left forearm, initial encounter: Secondary | ICD-10-CM | POA: Insufficient documentation

## 2017-06-20 DIAGNOSIS — R2232 Localized swelling, mass and lump, left upper limb: Secondary | ICD-10-CM | POA: Diagnosis not present

## 2017-06-20 DIAGNOSIS — F0390 Unspecified dementia without behavioral disturbance: Secondary | ICD-10-CM | POA: Diagnosis not present

## 2017-06-20 DIAGNOSIS — Y939 Activity, unspecified: Secondary | ICD-10-CM | POA: Diagnosis not present

## 2017-06-20 DIAGNOSIS — I252 Old myocardial infarction: Secondary | ICD-10-CM | POA: Insufficient documentation

## 2017-06-20 DIAGNOSIS — X58XXXA Exposure to other specified factors, initial encounter: Secondary | ICD-10-CM | POA: Insufficient documentation

## 2017-06-20 DIAGNOSIS — Y999 Unspecified external cause status: Secondary | ICD-10-CM | POA: Diagnosis not present

## 2017-06-20 DIAGNOSIS — L03114 Cellulitis of left upper limb: Secondary | ICD-10-CM | POA: Diagnosis not present

## 2017-06-20 DIAGNOSIS — Z79899 Other long term (current) drug therapy: Secondary | ICD-10-CM | POA: Insufficient documentation

## 2017-06-20 DIAGNOSIS — I2510 Atherosclerotic heart disease of native coronary artery without angina pectoris: Secondary | ICD-10-CM | POA: Insufficient documentation

## 2017-06-20 DIAGNOSIS — M7989 Other specified soft tissue disorders: Secondary | ICD-10-CM | POA: Diagnosis not present

## 2017-06-20 DIAGNOSIS — M79642 Pain in left hand: Secondary | ICD-10-CM | POA: Diagnosis present

## 2017-06-20 LAB — CBC WITH DIFFERENTIAL/PLATELET
BASOS ABS: 0 10*3/uL (ref 0–0.1)
Basophils Relative: 1 %
EOS PCT: 3 %
Eosinophils Absolute: 0.2 10*3/uL (ref 0–0.7)
HEMATOCRIT: 30.6 % — AB (ref 40.0–52.0)
HEMOGLOBIN: 10.4 g/dL — AB (ref 13.0–18.0)
LYMPHS ABS: 1.2 10*3/uL (ref 1.0–3.6)
LYMPHS PCT: 18 %
MCH: 30.2 pg (ref 26.0–34.0)
MCHC: 34.1 g/dL (ref 32.0–36.0)
MCV: 88.7 fL (ref 80.0–100.0)
Monocytes Absolute: 0.4 10*3/uL (ref 0.2–1.0)
Monocytes Relative: 7 %
NEUTROS ABS: 4.7 10*3/uL (ref 1.4–6.5)
NEUTROS PCT: 71 %
Platelets: 216 10*3/uL (ref 150–440)
RBC: 3.45 MIL/uL — ABNORMAL LOW (ref 4.40–5.90)
RDW: 14.6 % — ABNORMAL HIGH (ref 11.5–14.5)
WBC: 6.6 10*3/uL (ref 3.8–10.6)

## 2017-06-20 LAB — COMPREHENSIVE METABOLIC PANEL
ALT: 11 U/L — ABNORMAL LOW (ref 17–63)
ANION GAP: 9 (ref 5–15)
AST: 17 U/L (ref 15–41)
Albumin: 2.8 g/dL — ABNORMAL LOW (ref 3.5–5.0)
Alkaline Phosphatase: 109 U/L (ref 38–126)
BUN: 15 mg/dL (ref 6–20)
CHLORIDE: 103 mmol/L (ref 101–111)
CO2: 27 mmol/L (ref 22–32)
Calcium: 9.1 mg/dL (ref 8.9–10.3)
Creatinine, Ser: 1.26 mg/dL — ABNORMAL HIGH (ref 0.61–1.24)
GFR calc Af Amer: 56 mL/min — ABNORMAL LOW (ref >60)
GFR, EST NON AFRICAN AMERICAN: 48 mL/min — AB (ref >60)
Glucose, Bld: 158 mg/dL — ABNORMAL HIGH (ref 65–99)
POTASSIUM: 3.8 mmol/L (ref 3.5–5.1)
Sodium: 139 mmol/L (ref 135–145)
Total Bilirubin: 0.4 mg/dL (ref 0.3–1.2)
Total Protein: 6.6 g/dL (ref 6.5–8.1)

## 2017-06-20 LAB — BRAIN NATRIURETIC PEPTIDE: B Natriuretic Peptide: 445 pg/mL — ABNORMAL HIGH (ref 0.0–100.0)

## 2017-06-20 MED ORDER — LORAZEPAM 1 MG PO TABS
1.0000 mg | ORAL_TABLET | Freq: Once | ORAL | Status: AC
  Administered 2017-06-20: 1 mg via ORAL
  Filled 2017-06-20: qty 1

## 2017-06-20 MED ORDER — TETANUS-DIPHTH-ACELL PERTUSSIS 5-2.5-18.5 LF-MCG/0.5 IM SUSP
0.5000 mL | Freq: Once | INTRAMUSCULAR | Status: AC
  Administered 2017-06-20: 0.5 mL via INTRAMUSCULAR
  Filled 2017-06-20: qty 0.5

## 2017-06-20 MED ORDER — CEPHALEXIN 500 MG PO CAPS
500.0000 mg | ORAL_CAPSULE | Freq: Once | ORAL | Status: AC
  Administered 2017-06-20: 500 mg via ORAL
  Filled 2017-06-20: qty 1

## 2017-06-20 MED ORDER — CEPHALEXIN 500 MG PO CAPS: 500.0000 mg | ORAL_CAPSULE | Freq: Two times a day (BID) | ORAL | 0 refills | Status: AC

## 2017-06-20 MED ORDER — ACETAMINOPHEN 325 MG PO TABS
650.0000 mg | ORAL_TABLET | Freq: Once | ORAL | Status: AC
  Administered 2017-06-20: 650 mg via ORAL
  Filled 2017-06-20: qty 2

## 2017-06-20 NOTE — ED Notes (Signed)
Patient transported to Ultrasound 

## 2017-06-20 NOTE — Progress Notes (Signed)
Patient brought back to room after unsuccessful attempt to have ultrasound completed due to agitation.

## 2017-06-20 NOTE — Discharge Instructions (Signed)
You were evaluated for left arm swelling and I suspect may be from bruising.  I am covering for early cellulitis with antibiotic Keflex.  Xrays are negative for fractures and ultrasound is negative for deep vein blood clot.  Return to the ER for any new or worsening condition including worsening pain, fever, or altered mental status.  You were given tetanus booster today.

## 2017-06-20 NOTE — Progress Notes (Signed)
Dr. Reita Cliche spoke with family about unsuccessful attempt to have ultrasound.  Per Dr. Reita Cliche patient to have PO tylenol and ativan and the re-attempt ultrasound.  Son willing to go over with patient during test for comfort. Ultrasound called and notified.

## 2017-06-20 NOTE — ED Provider Notes (Signed)
[facility] Emergency Department Provider Note ____________________________________________   I have reviewed the triage vital signs and the triage nursing note.  HISTORY  Chief Complaint Hand Pain and Conjunctivitis   Historian Level 5 caveat - patient is poor historian History obtained from EMS report from nursing home  HPI Adam Nielsen is a 83 y.o. male from nursing home, noted this AM to have new left hand swelling and right eye redness.  No reported fall or trauma.  Patient tells me his left arm and hand hurt.  No answer regarding whether he has any vision problems.  When the nurse offered a pillow he tells the nurse "You shit on yourself."  He is noted to have multiple small skin tears to both arms.   Past Medical History:  Diagnosis Date  . Acute bronchitis 11/22/2015   . BPH (benign prostatic hypertrophy)   . CAD (coronary artery disease)    a. s/p CABG approx 1997; b. patient of Dr. Wynonia Lawman, MD  . Chronic indwelling Foley catheter   . Dementia   . Dementia   . Fall    11/22/2015    . Hyperlipidemia   . Hypothyroid   . PAF (paroxysmal atrial fibrillation) (HCC)    a. not on long term full dose anticoagulation  . Prostate CA ([place])   . Urinary tract infection 11/22/2015     Patient Active Problem List   Diagnosis Date Noted  . Hypoxia 12/06/2016  . GI bleed 03/19/2016  . CAD (coronary artery disease) 03/19/2016  . Pressure ulcer 12/18/2015  . Urinary retention   . Paroxysmal atrial fibrillation (HCC)   . Typical atrial flutter ([place])   . NSTEMI (non-ST elevated myocardial infarction) ([place]) 12/14/2015  . Atrial fibrillation with rapid ventricular response ([place])   . S/P TURP   . Hematuria   . S/P CABG (coronary artery bypass graft)   . Hypotension   . BPH (benign prostatic hypertrophy) with urinary retention 12/12/2015  . UTI (urinary tract infection) 11/23/2015  . UTI (lower urinary tract infection) 11/22/2015  . Prostate cancer  ([place]) 11/22/2015  . Hypothyroidism 11/22/2015  . Atrial fibrillation ([place]) 11/22/2015  . Dementia 11/22/2015  . Fall 11/22/2015  . Bronchitis 11/22/2015    Past Surgical History:  Procedure Laterality Date  . ANKLE SURGERY    . BONE GRAFT HIP ILIAC CREST    . CORONARY ARTERY BYPASS GRAFT    . TRANSURETHRAL RESECTION OF PROSTATE N/A 12/12/2015   Procedure: TRANSURETHRAL RESECTION OF THE PROSTATE ;  Surgeon: Kathie Rhodes, MD;  Location: [facility] ORS;  Service: Urology;  Laterality: N/A;    Prior to Admission medications   Medication Sig Start Date End Date Taking? Authorizing Provider  acetaminophen (TYLENOL) 500 MG tablet Take 500 mg by mouth every 4 (four) hours as needed for mild pain or moderate pain.     [provider]  albuterol (PROVENTIL HFA;VENTOLIN HFA) 108 (90 Base) MCG/ACT inhaler Inhale 2 puffs into the lungs every 6 (six) hours as needed for wheezing or shortness of breath. 11/24/15   Loletha Grayer, MD  ALPRAZolam Duanne Moron) 0.25 MG tablet Take 1 tablet (0.25 mg total) by mouth 3 (three) times daily as needed for anxiety. For agitation 12/10/16   Vaughan Basta, MD  bacitracin 500 UNIT/GM ointment Apply 1 application topically every morning. To wound on upper arm and forearm    [provider]  bicalutamide (CASODEX) 50 MG tablet Take 50 mg by mouth daily. Reported on 12/13/2015  [provider]  Calcium Carbonate-Vit D-Min (CALTRATE 600+D PLUS MINERALS) 600-800 MG-UNIT CHEW Chew 2 tablets by mouth daily.    [provider]  cephALEXin (KEFLEX) 500 MG capsule Take 1 capsule (500 mg total) by mouth 2 (two) times daily. 06/20/17   Lisa Roca, MD  divalproex (DEPAKOTE SPRINKLE) 125 MG capsule Take 250 mg by mouth 2 (two) times daily.    [provider]  donepezil (ARICEPT) 10 MG tablet Take 10 mg by mouth every evening.     [provider]  ferrous sulfate 325 (65 FE) MG tablet Take 1 tablet (325 mg total) by mouth 2 (two)  times daily with a meal. Patient taking differently: Take 325 mg by mouth daily.  03/22/16   Hillary Bow, MD  finasteride (PROSCAR) 5 MG tablet Take 5 mg by mouth daily.    [provider]  furosemide (LASIX) 20 MG tablet Take 1 tablet (20 mg total) by mouth every other day. 12/25/16 03/25/17  Minna Merritts, MD  guaifenesin (ROBITUSSIN) 100 MG/5ML syrup Take 200 mg by mouth every 6 (six) hours as needed for cough.    [provider]  levothyroxine (SYNTHROID, LEVOTHROID) 50 MCG tablet Take 50 mcg by mouth daily before breakfast.    [provider]  magnesium hydroxide (MILK OF MAGNESIA) 400 MG/5ML suspension Take 30 mLs by mouth at bedtime as needed for mild constipation.    [provider]  Melatonin 5 MG TABS Take 5 mg by mouth at bedtime.    [provider]  Menthol-Zinc Oxide (RISAMINE) 0.44-20.625 % OINT Apply 1 application topically 2 (two) times daily as needed. For rash    [provider]  midodrine (PROAMATINE) 10 MG tablet Take 1 tablet (10 mg total) by mouth 2 (two) times daily. 12/25/16   Minna Merritts, MD  Multiple Vitamin (MULTIVITAMIN WITH MINERALS) TABS tablet Take 1 tablet by mouth daily.    [provider]  Omega-3 Fatty Acids (FISH OIL) 1000 MG CAPS Take 1,000 mg by mouth daily.    [provider]  pantoprazole (PROTONIX) 40 MG tablet Take 1 tablet (40 mg total) by mouth daily. 03/22/16   Hillary Bow, MD  potassium chloride (K-DUR) 10 MEQ tablet Take 1 tablet (10 mEq total) by mouth every other day. 12/25/16 03/25/17  Minna Merritts, MD  rosuvastatin (CRESTOR) 5 MG tablet Take 1 tablet (5 mg total) by mouth daily at 6 PM. 12/21/15   Vaughan Basta, MD  sertraline (ZOLOFT) 50 MG tablet Take 50 mg by mouth at bedtime.    [provider]  tamsulosin (FLOMAX) 0.4 MG CAPS capsule Take 1 capsule (0.4 mg total) by mouth daily after supper. 12/21/15   Vaughan Basta, MD    No Known  Allergies  Family History  Problem Relation Age of Onset  . Heart disease Father   . Cancer Brother     Social History Social History  Substance Use Topics  . Smoking status: Former Research scientist (life sciences)  . Smokeless tobacco: Never Used  . Alcohol use No    Review of Systems Level 5 caveat - poor historian but these are his answers  Constitutional: Negative for fever. Eyes: Negative for visual changes. ENT: Negative for sore throat. Cardiovascular: Negative for chest pain. Respiratory: Negative for shortness of breath. Gastrointestinal: Negative for abdominal pain, vomiting and diarrhea. Genitourinary: Negative for dysuria. Musculoskeletal: Negative for back pain.  Positive for left arm pain. Skin: Positive for skin tears. Neurological: Negative for headache.  ____________________________________________   PHYSICAL EXAM:  VITAL SIGNS: ED Triage Vitals  Enc Vitals Group     BP      Pulse      Resp      Temp      Temp src      SpO2      Weight      Height      Head Circumference      Peak Flow      Pain Score      Pain Loc      Pain Edu?      Excl. in [place]?      Constitutional: Alert not oriented to time. Well appearing overall and in no distress. HEENT   Head: Normocephalic and atraumatic.      Eyes: Right eye conjunctival hemorrhage.  Pupils rrl.      Ears:         Nose: No congestion/rhinnorhea.   Mouth/Throat: Mucous membranes are moist.   Neck: No stridor. Cardiovascular/Chest: Normal rate, regular rhythm.  No murmurs, rubs, or gallops. Respiratory: Normal respiratory effort without tachypnea nor retractions. Breath sounds are clear and equal bilaterally. No wheezes/rales/rhonchi. Gastrointestinal: Soft. No distention, no guarding, no rebound. Nontender. Genitourinary/rectal:Deferred Musculoskeletal: Left upper extremity with pitting edema hand to mid upper arm, tender.  Slightly red at hand.  Few small skin tears left arm, one small skin tear right arm.   No deformity.  Pelvis stable.  No lower extremity pain.  Neurologic:  Normal speech and language. No gross or focal neurologic deficits are appreciated. Skin:  Skin is warm, dry.  Few arm skin tears as above.  Mild erythema and pressure appearing redness to left mid/posterior chest.   Psychiatric: Agitated at time, mostly redirectable.   ____________________________________________  LABS (pertinent positives/negatives) I, Lisa Roca, MD the attending physician have reviewed the labs noted below.  Labs Reviewed  COMPREHENSIVE METABOLIC PANEL - Abnormal; Notable for the following:       Result Value   Glucose, Bld 158 (*)    Creatinine, Ser 1.26 (*)    Albumin 2.8 (*)    ALT 11 (*)    GFR calc non Af Amer 48 (*)    GFR calc Af Amer 56 (*)    All other components within normal limits  CBC WITH DIFFERENTIAL/PLATELET - Abnormal; Notable for the following:    RBC 3.45 (*)    Hemoglobin 10.4 (*)    HCT 30.6 (*)    RDW 14.6 (*)    All other components within normal limits  BRAIN NATRIURETIC PEPTIDE - Abnormal; Notable for the following:    B Natriuretic Peptide 445.0 (*)    All other components within normal limits    ____________________________________________    EKG I, Lisa Roca, MD, the attending physician have personally viewed and interpreted all ECGs.  None ____________________________________________  RADIOLOGY All Xrays were viewed by me.  Imaging interpreted by Radiologist, and I, Lisa Roca, MD the attending physician have reviewed the radiologist interpretation noted below.  Elbow xray:  IMPRESSION: There is no acute bony abnormality of the right elbow. There is a small olecranon spur.  Left wrist xray:  IMPRESSION: No acute fracture nor dislocation of the bones of the left wrist are observed. There is mild overlying soft tissue swelling.   LUE [place]:   IMPRESSION: No evidence of DVT within the left upper  extremity. __________________________________________  PROCEDURES  Procedure(s) performed: None  Critical Care performed: None  ____________________________________________  No current  facility-administered medications on file prior to encounter.    Current Outpatient Prescriptions on File Prior to Encounter  Medication Sig Dispense Refill  . acetaminophen (TYLENOL) 500 MG tablet Take 500 mg by mouth every 4 (four) hours as needed for mild pain or moderate pain.     Marland Kitchen albuterol (PROVENTIL HFA;VENTOLIN HFA) 108 (90 Base) MCG/ACT inhaler Inhale 2 puffs into the lungs every 6 (six) hours as needed for wheezing or shortness of breath. 1 Inhaler 0  . ALPRAZolam (XANAX) 0.25 MG tablet Take 1 tablet (0.25 mg total) by mouth 3 (three) times daily as needed for anxiety. For agitation 30 tablet 0  . bacitracin 500 UNIT/GM ointment Apply 1 application topically every morning. To wound on upper arm and forearm    . bicalutamide (CASODEX) 50 MG tablet Take 50 mg by mouth daily. Reported on 12/13/2015    . Calcium Carbonate-Vit D-Min (CALTRATE 600+D PLUS MINERALS) 600-800 MG-UNIT CHEW Chew 2 tablets by mouth daily.    . divalproex (DEPAKOTE SPRINKLE) 125 MG capsule Take 250 mg by mouth 2 (two) times daily.    Marland Kitchen donepezil (ARICEPT) 10 MG tablet Take 10 mg by mouth every evening.     . ferrous sulfate 325 (65 FE) MG tablet Take 1 tablet (325 mg total) by mouth 2 (two) times daily with a meal. (Patient taking differently: Take 325 mg by mouth daily. ) 180 tablet 0  . finasteride (PROSCAR) 5 MG tablet Take 5 mg by mouth daily.    . furosemide (LASIX) 20 MG tablet Take 1 tablet (20 mg total) by mouth every other day. 90 tablet 0  . guaifenesin (ROBITUSSIN) 100 MG/5ML syrup Take 200 mg by mouth every 6 (six) hours as needed for cough.    . levothyroxine (SYNTHROID, LEVOTHROID) 50 MCG tablet Take 50 mcg by mouth daily before breakfast.    . magnesium hydroxide (MILK OF MAGNESIA) 400 MG/5ML suspension Take 30  mLs by mouth at bedtime as needed for mild constipation.    . Melatonin 5 MG TABS Take 5 mg by mouth at bedtime.    . Menthol-Zinc Oxide (RISAMINE) 0.44-20.625 % OINT Apply 1 application topically 2 (two) times daily as needed. For rash    . midodrine (PROAMATINE) 10 MG tablet Take 1 tablet (10 mg total) by mouth 2 (two) times daily. 60 tablet 0  . Multiple Vitamin (MULTIVITAMIN WITH MINERALS) TABS tablet Take 1 tablet by mouth daily.    . Omega-3 Fatty Acids (FISH OIL) 1000 MG CAPS Take 1,000 mg by mouth daily.    . pantoprazole (PROTONIX) 40 MG tablet Take 1 tablet (40 mg total) by mouth daily. 30 tablet 0  . potassium chloride (K-DUR) 10 MEQ tablet Take 1 tablet (10 mEq total) by mouth every other day. 90 tablet 3  . rosuvastatin (CRESTOR) 5 MG tablet Take 1 tablet (5 mg total) by mouth daily at 6 PM. 30 tablet 0  . sertraline (ZOLOFT) 50 MG tablet Take 50 mg by mouth at bedtime.    . tamsulosin (FLOMAX) 0.4 MG CAPS capsule Take 1 capsule (0.4 mg total) by mouth daily after supper. 30 capsule 0    ____________________________________________  ED COURSE / ASSESSMENT AND PLAN  Pertinent labs & imaging results that were available during my care of the patient were reviewed by me and considered in my medical decision making (see chart for details).   He does have a few skin tears which look like really rather minor trauma could have caused those, but  given the unilateral ue swelling, will xray elbow and wrist since that seems to be where it is hurting the most as well as get an [place] to ro ue dvt.  Seems like not significant cellulitis at this point, more edema, however due to skin tear and no other alternate diagnosis except for possibly contusion, will cover with keflex.  I spoke with ultrasound tech, patient too agitated to allow complete rule out of ue dvt.  However, it sounds like the subclavian and neck veins were patent as was a quick spot eval of the brachial and cephalic.  I spoke  with son about how to proceed-- whether to pursue additional agitation control to complete the [place] and he is ok to try some ativan.    Ativan was helpful for patient to complete exam - no dvt.  Son will take patient back to NH.  We updated his tetanus as family did not know if he was uptodate.  DIFFERENTIAL DIAGNOSIS: including but not limited to deep venous thrombosis, cellulitis, CHF, traumatic injury, etc.  CONSULTATIONS:   None   Patient / Family / Caregiver informed of clinical course, medical decision-making process, and agree with plan.   I discussed return precautions, follow-up instructions, and discharge instructions with patient and/or family.  Discharge Instructions : You were evaluated for left arm swelling and I suspect may be from bruising.  I am covering for early cellulitis with antibiotic Keflex.  Xrays are negative for fractures and ultrasound is negative for deep vein blood clot.  Return to the ER for any new or worsening condition including worsening pain, fever, or altered mental status.  You were given tetanus booster today. ___________________________________________   FINAL CLINICAL IMPRESSION(S) / ED DIAGNOSES   Final diagnoses:  Left arm swelling  Chronic anemia  Left upper extremity swelling  Multiple skin tears  Cellulitis of left arm  Subconjunctival hematoma, right              Note: This dictation was prepared with Dragon dictation. Any transcriptional errors that result from this process are unintentional    Lisa Roca, MD 06/20/17 1300

## 2017-06-20 NOTE — ED Triage Notes (Signed)
Patient brought in via EMS from [facility] for left hand swelling and right eye redness.  Staff denies any falls.  Patient with history of dementia and wheelchair bound.  Patient complains of pain in left hand.  Left hand is swollen and tender to touch.  Right radial pulse 2+.

## 2017-06-23 ENCOUNTER — Emergency Department: Payer: Medicare Other

## 2017-06-23 ENCOUNTER — Emergency Department
Admission: EM | Admit: 2017-06-23 | Discharge: 2017-06-23 | Disposition: A | Discharge status: Home / Self Care | Payer: Medicare Other | Attending: Student in an Organized Health Care Education/Training Program | Admitting: Student in an Organized Health Care Education/Training Program

## 2017-06-23 ENCOUNTER — Encounter: Payer: Self-pay | Admitting: *Deleted

## 2017-06-23 DIAGNOSIS — R296 Repeated falls: Secondary | ICD-10-CM | POA: Insufficient documentation

## 2017-06-23 DIAGNOSIS — Z87891 Personal history of nicotine dependence: Secondary | ICD-10-CM | POA: Insufficient documentation

## 2017-06-23 DIAGNOSIS — Z951 Presence of aortocoronary bypass graft: Secondary | ICD-10-CM | POA: Diagnosis not present

## 2017-06-23 DIAGNOSIS — Z23 Encounter for immunization: Secondary | ICD-10-CM | POA: Diagnosis not present

## 2017-06-23 DIAGNOSIS — M542 Cervicalgia: Secondary | ICD-10-CM | POA: Insufficient documentation

## 2017-06-23 DIAGNOSIS — M549 Dorsalgia, unspecified: Secondary | ICD-10-CM | POA: Diagnosis not present

## 2017-06-23 DIAGNOSIS — F0390 Unspecified dementia without behavioral disturbance: Secondary | ICD-10-CM | POA: Diagnosis not present

## 2017-06-23 DIAGNOSIS — Y999 Unspecified external cause status: Secondary | ICD-10-CM | POA: Diagnosis not present

## 2017-06-23 DIAGNOSIS — I2510 Atherosclerotic heart disease of native coronary artery without angina pectoris: Secondary | ICD-10-CM | POA: Insufficient documentation

## 2017-06-23 DIAGNOSIS — S0993XA Unspecified injury of face, initial encounter: Secondary | ICD-10-CM | POA: Diagnosis present

## 2017-06-23 DIAGNOSIS — Y939 Activity, unspecified: Secondary | ICD-10-CM | POA: Insufficient documentation

## 2017-06-23 DIAGNOSIS — S0083XA Contusion of other part of head, initial encounter: Secondary | ICD-10-CM | POA: Diagnosis not present

## 2017-06-23 DIAGNOSIS — Y92129 Unspecified place in nursing home as the place of occurrence of the external cause: Secondary | ICD-10-CM | POA: Insufficient documentation

## 2017-06-23 DIAGNOSIS — Z79899 Other long term (current) drug therapy: Secondary | ICD-10-CM | POA: Insufficient documentation

## 2017-06-23 DIAGNOSIS — E039 Hypothyroidism, unspecified: Secondary | ICD-10-CM | POA: Diagnosis not present

## 2017-06-23 DIAGNOSIS — E860 Dehydration: Secondary | ICD-10-CM | POA: Diagnosis not present

## 2017-06-23 DIAGNOSIS — W1830XA Fall on same level, unspecified, initial encounter: Secondary | ICD-10-CM | POA: Diagnosis not present

## 2017-06-23 DIAGNOSIS — W19XXXD Unspecified fall, subsequent encounter: Secondary | ICD-10-CM

## 2017-06-23 LAB — BASIC METABOLIC PANEL
Anion gap: 8 (ref 5–15)
BUN: 16 mg/dL (ref 6–20)
CALCIUM: 9.5 mg/dL (ref 8.9–10.3)
CO2: 25 mmol/L (ref 22–32)
Chloride: 106 mmol/L (ref 101–111)
Creatinine, Ser: 1.12 mg/dL (ref 0.61–1.24)
GFR calc Af Amer: >60 mL/min (ref >60)
GFR, EST NON AFRICAN AMERICAN: 56 mL/min — AB (ref >60)
GLUCOSE: 116 mg/dL — AB (ref 65–99)
Potassium: 3.8 mmol/L (ref 3.5–5.1)
SODIUM: 139 mmol/L (ref 135–145)

## 2017-06-23 LAB — CBC WITH DIFFERENTIAL/PLATELET
BASOS ABS: 0.1 10*3/uL (ref 0–0.1)
Basophils Relative: 1 %
EOS ABS: 0.2 10*3/uL (ref 0–0.7)
EOS PCT: 4 %
HCT: 31.6 % — ABNORMAL LOW (ref 40.0–52.0)
Hemoglobin: 11.1 g/dL — ABNORMAL LOW (ref 13.0–18.0)
LYMPHS PCT: 20 %
Lymphs Abs: 1.2 10*3/uL (ref 1.0–3.6)
MCH: 30.8 pg (ref 26.0–34.0)
MCHC: 35 g/dL (ref 32.0–36.0)
MCV: 88.2 fL (ref 80.0–100.0)
MONO ABS: 0.5 10*3/uL (ref 0.2–1.0)
Monocytes Relative: 8 %
Neutro Abs: 4.2 10*3/uL (ref 1.4–6.5)
Neutrophils Relative %: 67 %
PLATELETS: 267 10*3/uL (ref 150–440)
RBC: 3.59 MIL/uL — ABNORMAL LOW (ref 4.40–5.90)
RDW: 14.8 % — AB (ref 11.5–14.5)
WBC: 6.2 10*3/uL (ref 3.8–10.6)

## 2017-06-23 MED ORDER — HALOPERIDOL LACTATE 5 MG/ML IJ SOLN
5.0000 mg | Freq: Once | INTRAMUSCULAR | Status: AC
  Administered 2017-06-23: 5 mg via INTRAMUSCULAR
  Filled 2017-06-23: qty 1

## 2017-06-23 MED ORDER — LORAZEPAM 0.5 MG PO TABS
0.5000 mg | ORAL_TABLET | Freq: Once | ORAL | Status: AC
  Administered 2017-06-23: 0.5 mg via ORAL
  Filled 2017-06-23: qty 1

## 2017-06-23 MED ORDER — LORAZEPAM 1 MG PO TABS
1.0000 mg | ORAL_TABLET | Freq: Once | ORAL | Status: AC
  Administered 2017-06-23: 1 mg via ORAL
  Filled 2017-06-23: qty 1

## 2017-06-23 MED ORDER — TETANUS-DIPHTH-ACELL PERTUSSIS 5-2.5-18.5 LF-MCG/0.5 IM SUSP
0.5000 mL | Freq: Once | INTRAMUSCULAR | Status: AC
  Administered 2017-06-23: 0.5 mL via INTRAMUSCULAR
  Filled 2017-06-23: qty 0.5

## 2017-06-23 MED ORDER — SODIUM CHLORIDE 0.9 % IV BOLUS (SEPSIS)
500.0000 mL | Freq: Once | INTRAVENOUS | Status: AC
  Administered 2017-06-23: 500 mL via INTRAVENOUS

## 2017-06-23 MED ORDER — LORAZEPAM 0.5 MG PO TABS: 0.5000 mg | ORAL_TABLET | Freq: Once | ORAL | Status: DC

## 2017-06-23 NOTE — ED Provider Notes (Signed)
[facility] Emergency Department Provider Note    First MD Initiated Contact with Patient 06/23/17 1627     (approximate)  I have reviewed the triage vital signs and the nursing notes.   HISTORY  Chief Complaint Fall    HPI Adam Nielsen is a 83 y.o. male history dementia living at [facility] presented after unwitnessed fall. Patient has had increasing falls over the past several weeks. Initially was complaining of neck and back pain. Does have abrasions to his lip. On arrival to the ER he denies any complaints but is tearful with his daughter which she says is not unusual. Denies any chest pains or shortness of breath.Does have mild aching pain to the left upper lip.   Past Medical History:  Diagnosis Date  . Acute bronchitis 11/22/2015   . BPH (benign prostatic hypertrophy)   . CAD (coronary artery disease)    a. s/p CABG approx 1997; b. patient of Dr. Wynonia Lawman, MD  . Chronic indwelling Foley catheter   . Dementia   . Dementia   . Fall    11/22/2015    . Hyperlipidemia   . Hypothyroid   . PAF (paroxysmal atrial fibrillation) (HCC)    a. not on long term Nielsen dose anticoagulation  . Prostate CA ([place])   . Urinary tract infection 11/22/2015    Family History  Problem Relation Age of Onset  . Heart disease Father   . Cancer Brother    Past Surgical History:  Procedure Laterality Date  . ANKLE SURGERY    . BONE GRAFT HIP ILIAC CREST    . CORONARY ARTERY BYPASS GRAFT    . TRANSURETHRAL RESECTION OF PROSTATE N/A 12/12/2015   Procedure: TRANSURETHRAL RESECTION OF THE PROSTATE ;  Surgeon: Kathie Rhodes, MD;  Location: [facility];  Service: Urology;  Laterality: N/A;   Patient Active Problem List   Diagnosis Date Noted  . Hypoxia 12/06/2016  . GI bleed 03/19/2016  . CAD (coronary artery disease) 03/19/2016  . Pressure ulcer 12/18/2015  . Urinary retention   . Paroxysmal atrial fibrillation (HCC)   . Typical atrial flutter ([place])   . NSTEMI (non-ST  elevated myocardial infarction) ([place]) 12/14/2015  . Atrial fibrillation with rapid ventricular response ([place])   . S/P TURP   . Hematuria   . S/P CABG (coronary artery bypass graft)   . Hypotension   . BPH (benign prostatic hypertrophy) with urinary retention 12/12/2015  . UTI (urinary tract infection) 11/23/2015  . UTI (lower urinary tract infection) 11/22/2015  . Prostate cancer ([place]) 11/22/2015  . Hypothyroidism 11/22/2015  . Atrial fibrillation ([place]) 11/22/2015  . Dementia 11/22/2015  . Fall 11/22/2015  . Bronchitis 11/22/2015      Prior to Admission medications   Medication Sig Start Date End Date Taking? Authorizing Provider  bicalutamide (CASODEX) 50 MG tablet Take 50 mg by mouth daily. Reported on 12/13/2015   Yes [provider]  calcium-vitamin D (OSCAL WITH D) 500-200 MG-UNIT tablet Take 1 tablet by mouth daily with breakfast.   Yes [provider]  cephALEXin (KEFLEX) 500 MG capsule Take 1 capsule (500 mg total) by mouth 2 (two) times daily. 06/20/17  Yes Lisa Roca, MD  divalproex (DEPAKOTE SPRINKLE) 125 MG capsule Take 125-250 mg by mouth 3 (three) times daily.    Yes [provider]  donepezil (ARICEPT) 10 MG tablet Take 10 mg by mouth every evening.    Yes [provider]  ferrous sulfate 325 (65 FE)  MG tablet Take 1 tablet (325 mg total) by mouth 2 (two) times daily with a meal. 03/22/16  Yes Sudini, Srikar, MD  finasteride (PROSCAR) 5 MG tablet Take 5 mg by mouth daily.   Yes [provider]  levothyroxine (SYNTHROID, LEVOTHROID) 50 MCG tablet Take 50 mcg by mouth daily before breakfast.   Yes [provider]  Melatonin 5 MG TABS Take 5 mg by mouth at bedtime.   Yes [provider]  midodrine (PROAMATINE) 10 MG tablet Take 1 tablet (10 mg total) by mouth 2 (two) times daily. 12/25/16  Yes Minna Merritts, MD  Multiple Vitamin (MULTIVITAMIN WITH MINERALS) TABS tablet Take 1 tablet by mouth daily.   Yes  [provider]  pantoprazole (PROTONIX) 40 MG tablet Take 1 tablet (40 mg total) by mouth daily. 03/22/16  Yes Sudini, Alveta Heimlich, MD  potassium chloride (K-DUR) 10 MEQ tablet Take 1 tablet (10 mEq total) by mouth every other day. 12/25/16 06/23/17 Yes Gollan, Kathlene November, MD  QUEtiapine (SEROQUEL) 50 MG tablet Take 50 mg by mouth at bedtime.   Yes [provider]  rosuvastatin (CRESTOR) 5 MG tablet Take 1 tablet (5 mg total) by mouth daily at 6 PM. 12/21/15  Yes Vaughan Basta, MD  saccharomyces boulardii (FLORASTOR) 250 MG capsule Take 250 mg by mouth 2 (two) times daily.   Yes [provider]  sertraline (ZOLOFT) 50 MG tablet Take 50 mg by mouth daily.    Yes [provider]  tamsulosin (FLOMAX) 0.4 MG CAPS capsule Take 1 capsule (0.4 mg total) by mouth daily after supper. 12/21/15  Yes Vaughan Basta, MD  acetaminophen (TYLENOL) 500 MG tablet Take 500 mg by mouth every 4 (four) hours as needed for mild pain or moderate pain.     [provider]  albuterol (PROVENTIL HFA;VENTOLIN HFA) 108 (90 Base) MCG/ACT inhaler Inhale 2 puffs into the lungs every 6 (six) hours as needed for wheezing or shortness of breath. 11/24/15   Loletha Grayer, MD  ALPRAZolam Duanne Moron) 0.25 MG tablet Take 1 tablet (0.25 mg total) by mouth 3 (three) times daily as needed for anxiety. For agitation 12/10/16   Vaughan Basta, MD  furosemide (LASIX) 20 MG tablet Take 1 tablet (20 mg total) by mouth every other day. Patient taking differently: Take 20 mg by mouth daily.  12/25/16 03/25/17  Minna Merritts, MD  guaifenesin (ROBITUSSIN) 100 MG/5ML syrup Take 200 mg by mouth every 6 (six) hours as needed for cough.    [provider]  magnesium hydroxide (MILK OF MAGNESIA) 400 MG/5ML suspension Take 30 mLs by mouth at bedtime as needed for mild constipation.    [provider]  risperiDONE (RISPERDAL) 0.5 MG tablet Take 0.5 mg by mouth daily as needed.     [provider]    Allergies Patient has no known allergies.    Social History Social History  Substance Use Topics  . Smoking status: Former Research scientist (life sciences)  . Smokeless tobacco: Never Used  . Alcohol use No    Review of Systems Patient denies headaches, rhinorrhea, blurry vision, numbness, shortness of breath, chest pain, edema, cough, abdominal pain, nausea, vomiting, diarrhea, dysuria, fevers, rashes or hallucinations unless otherwise stated above in HPI. ____________________________________________   PHYSICAL EXAM:  VITAL SIGNS: Vitals:   06/23/17 1614  BP: (!) 95/59  Pulse: 86  Resp: 18  Temp: 97.8 F (36.6 C)  SpO2: 100%    Constitutional: Alert elderly and frail appearing, in no acute distress. Eyes:  Conjunctivae are normal.  Head: Atraumatic. Nose: No congestion/rhinnorhea. Mouth/Throat: Mucous membranes are moist.  Dentition intact Neck: No stridor. Painless ROM.  Cardiovascular: Normal rate, regular rhythm. Grossly normal heart sounds.  Good peripheral circulation. Respiratory: Normal respiratory effort.  No retractions. Lungs CTAB. Gastrointestinal: Soft and nontender. No distention. No abdominal bruits. No CVA tenderness. Musculoskeletal: No lower extremity tenderness nor edema.  No joint effusions. Neurologic:  No gross focal neurologic deficits are appreciated. No facial droop Skin:  Skin is warm, dry.  Superficial abrasion to left cheek and upper lip, no involvement of vermilion border,  Psychiatric: Mood and affect are normal. Speech and behavior are normal.  ____________________________________________   LABS (all labs ordered are listed, but only abnormal results are displayed)  Results for orders placed or performed during the hospital encounter of 06/23/17 (from the past 24 hour(s))  CBC with Differential/Platelet     Status: Abnormal   Collection Time: 06/23/17  6:12 PM  Result Value Ref Range   WBC 6.2 3.8 - 10.6 K/uL   RBC 3.59 (L)  4.40 - 5.90 MIL/uL   Hemoglobin 11.1 (L) 13.0 - 18.0 g/dL   HCT 31.6 (L) 40.0 - 52.0 %   MCV 88.2 80.0 - 100.0 fL   MCH 30.8 26.0 - 34.0 pg   MCHC 35.0 32.0 - 36.0 g/dL   RDW 14.8 (H) 11.5 - 14.5 %   Platelets 267 150 - 440 K/uL   Neutrophils Relative % 67 %   Neutro Abs 4.2 1.4 - 6.5 K/uL   Lymphocytes Relative 20 %   Lymphs Abs 1.2 1.0 - 3.6 K/uL   Monocytes Relative 8 %   Monocytes Absolute 0.5 0.2 - 1.0 K/uL   Eosinophils Relative 4 %   Eosinophils Absolute 0.2 0 - 0.7 K/uL   Basophils Relative 1 %   Basophils Absolute 0.1 0 - 0.1 K/uL  Basic metabolic panel     Status: Abnormal   Collection Time: 06/23/17  6:12 PM  Result Value Ref Range   Sodium 139 135 - 145 mmol/L   Potassium 3.8 3.5 - 5.1 mmol/L   Chloride 106 101 - 111 mmol/L   CO2 25 22 - 32 mmol/L   Glucose, Bld 116 (H) 65 - 99 mg/dL   BUN 16 6 - 20 mg/dL   Creatinine, Ser 1.12 0.61 - 1.24 mg/dL   Calcium 9.5 8.9 - 10.3 mg/dL   GFR calc non Af Amer 56 (L) >60 mL/min   GFR calc Af Amer >60 >60 mL/min   Anion gap 8 5 - 15   ____________________________________________ ____________________________________________  RADIOLOGY  I personally reviewed all radiographic images ordered to evaluate for the above acute complaints and reviewed radiology reports and findings.  These findings were personally discussed with the patient.  Please see medical record for radiology report.  ____________________________________________   PROCEDURES  Procedure(s) performed:  Procedures    Critical Care performed: no ____________________________________________   INITIAL IMPRESSION / ASSESSMENT AND PLAN / ED COURSE  Pertinent labs & imaging results that were available during my care of the patient were reviewed by me and considered in my medical decision making (see chart for details).  DDX: fall, contusion, laceration, dehydration  Adam Nielsen is a 83 y.o. who presents to the ED with fall and abrasions to face as  described above. Otherwise afebrile and well perfused. Abdominal exam is soft and benign. We'll order radiographs of chest neck and CT head and face to evaluate for traumatic injury. Will  check basic blood work to evaluate for dehydration. Denies any chest pain or shortness of breath this time.  Clinical Course as of Jun 24 1847  Nancy Fetter Jun 23, 2017  1827 Radiographs are reassuring as far.  [PR]  1846 Patient hemodynamically stable. Denies any discomfort at this time.  She had baseline per family. At this point hemodynamically stable for discharge back to living facility.  [PR]    Clinical Course User Index [PR] Merlyn Lot, MD     ____________________________________________   FINAL CLINICAL IMPRESSION(S) / ED DIAGNOSES  Final diagnoses:  Fall, subsequent encounter  Contusion of face, initial encounter  Dehydration      NEW MEDICATIONS STARTED DURING THIS VISIT:  New Prescriptions   No medications on file     Note:  This document was prepared using Dragon voice recognition software and may include unintentional dictation errors.    Merlyn Lot, MD 06/23/17 [phone]

## 2017-06-23 NOTE — ED Triage Notes (Signed)
Per EMS report, Staff at [facility] reported that the patient had an unwitnessed fall. Patient c/o to staff of neck and back pain, but EMS reports that the patient did not grimace with palpation in those areas. Patient has abrasions to lips and left cheek and bruising to forehead. Patient arrived alert and able to follow simple commands. Patient has dementia.

## 2017-06-23 NOTE — Discharge Instructions (Signed)
Keep vaseline and neosporin on lip abrasion to help healing process.  Return for worsening pain, fevers or for any other concerns or questions.

## 2017-06-23 NOTE — ED Notes (Signed)
Patient is back from CT scan.

## 2017-06-23 NOTE — ED Notes (Signed)
Hooked patient up to monitor. Gave patient a blanket and a pillow.

## 2017-07-01 ENCOUNTER — Emergency Department: Payer: Medicare Other

## 2017-07-01 ENCOUNTER — Emergency Department
Admission: EM | Admit: 2017-07-01 | Discharge: 2017-07-01 | Disposition: A | Discharge status: Skilled Nursing Facility | Payer: Medicare Other | Attending: Emergency Medicine | Admitting: Emergency Medicine

## 2017-07-01 ENCOUNTER — Encounter: Payer: Self-pay | Admitting: *Deleted

## 2017-07-01 DIAGNOSIS — F0390 Unspecified dementia without behavioral disturbance: Secondary | ICD-10-CM | POA: Insufficient documentation

## 2017-07-01 DIAGNOSIS — E039 Hypothyroidism, unspecified: Secondary | ICD-10-CM | POA: Diagnosis not present

## 2017-07-01 DIAGNOSIS — Z951 Presence of aortocoronary bypass graft: Secondary | ICD-10-CM | POA: Insufficient documentation

## 2017-07-01 DIAGNOSIS — R531 Weakness: Secondary | ICD-10-CM | POA: Diagnosis not present

## 2017-07-01 DIAGNOSIS — H9201 Otalgia, right ear: Secondary | ICD-10-CM | POA: Insufficient documentation

## 2017-07-01 DIAGNOSIS — Z87891 Personal history of nicotine dependence: Secondary | ICD-10-CM | POA: Diagnosis not present

## 2017-07-01 DIAGNOSIS — Z8546 Personal history of malignant neoplasm of prostate: Secondary | ICD-10-CM | POA: Diagnosis not present

## 2017-07-01 DIAGNOSIS — I2510 Atherosclerotic heart disease of native coronary artery without angina pectoris: Secondary | ICD-10-CM | POA: Insufficient documentation

## 2017-07-01 DIAGNOSIS — R030 Elevated blood-pressure reading, without diagnosis of hypertension: Secondary | ICD-10-CM | POA: Diagnosis present

## 2017-07-01 LAB — URINALYSIS, COMPLETE (UACMP) WITH MICROSCOPIC
BACTERIA UA: NONE SEEN
BILIRUBIN URINE: NEGATIVE
Glucose, UA: NEGATIVE mg/dL
HGB URINE DIPSTICK: NEGATIVE
KETONES UR: NEGATIVE mg/dL
LEUKOCYTES UA: NEGATIVE
NITRITE: NEGATIVE
PROTEIN: NEGATIVE mg/dL
Specific Gravity, Urine: 1.017 (ref 1.005–1.030)
pH: 6 (ref 5.0–8.0)

## 2017-07-01 LAB — CBC WITH DIFFERENTIAL/PLATELET
BASOS PCT: 1 %
Basophils Absolute: 0.1 10*3/uL (ref 0–0.1)
Eosinophils Absolute: 0.1 10*3/uL (ref 0–0.7)
Eosinophils Relative: 1 %
HEMATOCRIT: 31.1 % — AB (ref 40.0–52.0)
HEMOGLOBIN: 10.3 g/dL — AB (ref 13.0–18.0)
LYMPHS ABS: 1.1 10*3/uL (ref 1.0–3.6)
LYMPHS PCT: 16 %
MCH: 30 pg (ref 26.0–34.0)
MCHC: 33.3 g/dL (ref 32.0–36.0)
MCV: 90.2 fL (ref 80.0–100.0)
MONO ABS: 0.5 10*3/uL (ref 0.2–1.0)
MONOS PCT: 8 %
NEUTROS ABS: 5.2 10*3/uL (ref 1.4–6.5)
NEUTROS PCT: 74 %
Platelets: 236 10*3/uL (ref 150–440)
RBC: 3.44 MIL/uL — ABNORMAL LOW (ref 4.40–5.90)
RDW: 14.8 % — AB (ref 11.5–14.5)
WBC: 6.9 10*3/uL (ref 3.8–10.6)

## 2017-07-01 LAB — COMPREHENSIVE METABOLIC PANEL
ALBUMIN: 2.9 g/dL — AB (ref 3.5–5.0)
ALK PHOS: 118 U/L (ref 38–126)
ALT: 8 U/L — ABNORMAL LOW (ref 17–63)
ANION GAP: 7 (ref 5–15)
AST: 18 U/L (ref 15–41)
BUN: 15 mg/dL (ref 6–20)
CO2: 28 mmol/L (ref 22–32)
Calcium: 9 mg/dL (ref 8.9–10.3)
Chloride: 103 mmol/L (ref 101–111)
Creatinine, Ser: 0.98 mg/dL (ref 0.61–1.24)
GFR calc Af Amer: >60 mL/min (ref >60)
GFR calc non Af Amer: >60 mL/min (ref >60)
GLUCOSE: 121 mg/dL — AB (ref 65–99)
POTASSIUM: 4.1 mmol/L (ref 3.5–5.1)
SODIUM: 138 mmol/L (ref 135–145)
Total Bilirubin: 0.5 mg/dL (ref 0.3–1.2)
Total Protein: 6.9 g/dL (ref 6.5–8.1)

## 2017-07-01 LAB — TROPONIN I: Troponin I: <0.03 ng/mL (ref <0.03)

## 2017-07-01 MED ORDER — SODIUM CHLORIDE 0.9 % IV SOLN
1000.0000 mL | Freq: Once | INTRAVENOUS | Status: AC
  Administered 2017-07-01: 1000 mL via INTRAVENOUS

## 2017-07-01 NOTE — ED Notes (Signed)
Patient is drowsy, but arouses easily. No acute distress.

## 2017-07-01 NOTE — ED Notes (Signed)
With two-person full assists, patient was unable to maintain posture or weight bear.

## 2017-07-01 NOTE — ED Triage Notes (Signed)
Pt arrives via EMS from [facility], staff states pt has been complaining of right ear pain, staff also states increased weakness today, pt has not been rolling around in his wheelchair per normal, hx of dementia

## 2017-07-01 NOTE — ED Notes (Addendum)
Bed alarm applied. Yellow bracelet applied and fall risk sign on door.

## 2017-07-01 NOTE — ED Notes (Signed)
Patient taken to imaging. 

## 2017-07-01 NOTE — ED Provider Notes (Signed)
[facility] Emergency Department Provider Note       Time seen: ----------------------------------------- 4:21 PM on 07/01/2017 -----------------------------------------  Level V caveat: History/ROS limited by dementia   I have reviewed the triage vital signs and the nursing notes.   HISTORY   Chief Complaint Weakness    HPI Adam Nielsen is a 83 y.o. male with a history of advanced dementia who presents to the ED for possible high blood pressure. Care facility notes his blood pressure was elevated today and he has been less active than normal. Patient could not stand when assisted by EMS. He was complaining of some right ear pain but otherwise denies any complaints.  Past Medical History:  Diagnosis Date  . Acute bronchitis 11/22/2015   . BPH (benign prostatic hypertrophy)   . CAD (coronary artery disease)    a. s/p CABG approx 1997; b. patient of Dr. Wynonia Lawman, MD  . Chronic indwelling Foley catheter   . Dementia   . Dementia   . Fall    11/22/2015    . Hyperlipidemia   . Hypothyroid   . PAF (paroxysmal atrial fibrillation) (HCC)    a. not on long term full dose anticoagulation  . Prostate CA ([place])   . Urinary tract infection 11/22/2015     Patient Active Problem List   Diagnosis Date Noted  . Hypoxia 12/06/2016  . GI bleed 03/19/2016  . CAD (coronary artery disease) 03/19/2016  . Pressure ulcer 12/18/2015  . Urinary retention   . Paroxysmal atrial fibrillation (HCC)   . Typical atrial flutter ([place])   . NSTEMI (non-ST elevated myocardial infarction) ([place]) 12/14/2015  . Atrial fibrillation with rapid ventricular response ([place])   . S/P TURP   . Hematuria   . S/P CABG (coronary artery bypass graft)   . Hypotension   . BPH (benign prostatic hypertrophy) with urinary retention 12/12/2015  . UTI (urinary tract infection) 11/23/2015  . UTI (lower urinary tract infection) 11/22/2015  . Prostate cancer ([place]) 11/22/2015  . Hypothyroidism  11/22/2015  . Atrial fibrillation ([place]) 11/22/2015  . Dementia 11/22/2015  . Fall 11/22/2015  . Bronchitis 11/22/2015    Past Surgical History:  Procedure Laterality Date  . ANKLE SURGERY    . BONE GRAFT HIP ILIAC CREST    . CORONARY ARTERY BYPASS GRAFT    . TRANSURETHRAL RESECTION OF PROSTATE N/A 12/12/2015   Procedure: TRANSURETHRAL RESECTION OF THE PROSTATE ;  Surgeon: Kathie Rhodes, MD;  Location: [facility];  Service: Urology;  Laterality: N/A;    Allergies Patient has no known allergies.  Social History Social History  Substance Use Topics  . Smoking status: Former Research scientist (life sciences)  . Smokeless tobacco: Never Used  . Alcohol use No    Review of Systems ENT:  positive for right ear pain ROS otherwise unknown All systems negative/normal/unremarkable except as stated in the HPI ____________________________________________   PHYSICAL EXAM:  VITAL SIGNS: ED Triage Vitals [07/01/17 1619]  Enc Vitals Group     BP      Pulse      Resp      Temp      Temp src      SpO2      Weight 155 lb (70.3 kg)     Height 5\' 9"  (1.753 m)     Head Circumference      Peak Flow      Pain Score      Pain Loc      Pain Edu?  Excl. in [place]?    Constitutional: alert but disoriented and lethargic. No distress Eyes: Conjunctivae are normal. Normal extraocular movements. ENT   Head: Normocephalic and atraumatic.cerumen is noted diffusely in the right ear canal   Nose: No congestion/rhinnorhea.   Mouth/Throat: Mucous membranes are moist.   Neck: No stridor. Cardiovascular: Normal rate, regular rhythm.systolic murmur is noted Respiratory: Normal respiratory effort without tachypnea nor retractions. Breath sounds are clear and equal bilaterally. No wheezes/rales/rhonchi. Gastrointestinal: Soft and nontender. Normal bowel sounds Musculoskeletal: limited but nontender extremities Neurologic:  generalized weakness, nothing focal Skin:  Skin is warm, dry and intact. No rash  noted. Psychiatric: flat affect ____________________________________________  EKG: Interpreted by me.age for ablation with a rate of 113 bpm, normal QRS size, borderline long QT. Normal axis.  ____________________________________________  ED COURSE:  Pertinent labs & imaging results that were available during my care of the patient were reviewed by me and considered in my medical decision making (see chart for details). Patient presents for weakness and reported high blood pressure, we will assess with labs and imaging as indicated.   Procedures ____________________________________________   LABS (pertinent positives/negatives)  Labs Reviewed  CBC WITH DIFFERENTIAL/PLATELET - Abnormal; Notable for the following:       Result Value   RBC 3.44 (*)    Hemoglobin 10.3 (*)    HCT 31.1 (*)    RDW 14.8 (*)    All other components within normal limits  COMPREHENSIVE METABOLIC PANEL - Abnormal; Notable for the following:    Glucose, Bld 121 (*)    Albumin 2.9 (*)    ALT 8 (*)    All other components within normal limits  URINALYSIS, COMPLETE (UACMP) WITH MICROSCOPIC - Abnormal; Notable for the following:    Color, Urine YELLOW (*)    APPearance HAZY (*)    Squamous Epithelial / LPF 0-5 (*)    All other components within normal limits  TROPONIN I  CBG MONITORING, ED    RADIOLOGY Images were viewed by me  CT head IMPRESSION: 1. No acute intracranial abnormality identified. 2. Stable advanced chronic microvascular ischemic changes and parenchymal volume loss of the brain. 3. Mild paranasal sinus disease. ____________________________________________  DIFFERENTIAL DIAGNOSIS   UTI, sepsis, pneumonia, dehydration, likely abnormality, MI, stroke   FINAL ASSESSMENT AND PLAN  weakness  Plan: Patient had presented for weakness and reported high blood pressure prehospital.. Patients labs reassuring without any evidence of acute abnormality. Patients imaging were also  reassuring. he is stable for outpatient follow-up.   Earleen Newport, MD   Note: This note was generated in part or whole with voice recognition software. Voice recognition is usually quite accurate but there are transcription errors that can and very often do occur. I apologize for any typographical errors that were not detected and corrected.     Earleen Newport, MD 07/01/17 [phone]

## 2017-07-01 NOTE — ED Notes (Signed)
[facility] for transport to [facility]

## 2017-07-01 NOTE — ED Notes (Signed)
[facility] at bedside to transport.

## 2017-07-24 ENCOUNTER — Emergency Department

## 2017-07-24 ENCOUNTER — Encounter: Payer: Self-pay | Admitting: Emergency Medicine

## 2017-07-24 ENCOUNTER — Inpatient Hospital Stay
Admission: EM | Admit: 2017-07-24 | Discharge: 2017-08-17 | DRG: 871 | Disposition: E | Attending: Internal Medicine | Admitting: Internal Medicine

## 2017-07-24 DIAGNOSIS — Z66 Do not resuscitate: Secondary | ICD-10-CM | POA: Diagnosis present

## 2017-07-24 DIAGNOSIS — E876 Hypokalemia: Secondary | ICD-10-CM | POA: Diagnosis present

## 2017-07-24 DIAGNOSIS — R319 Hematuria, unspecified: Secondary | ICD-10-CM | POA: Diagnosis present

## 2017-07-24 DIAGNOSIS — N179 Acute kidney failure, unspecified: Secondary | ICD-10-CM | POA: Diagnosis present

## 2017-07-24 DIAGNOSIS — I480 Paroxysmal atrial fibrillation: Secondary | ICD-10-CM | POA: Diagnosis present

## 2017-07-24 DIAGNOSIS — G9340 Encephalopathy, unspecified: Secondary | ICD-10-CM | POA: Diagnosis present

## 2017-07-24 DIAGNOSIS — Z809 Family history of malignant neoplasm, unspecified: Secondary | ICD-10-CM | POA: Diagnosis not present

## 2017-07-24 DIAGNOSIS — R6521 Severe sepsis with septic shock: Secondary | ICD-10-CM | POA: Diagnosis present

## 2017-07-24 DIAGNOSIS — F419 Anxiety disorder, unspecified: Secondary | ICD-10-CM | POA: Diagnosis present

## 2017-07-24 DIAGNOSIS — I481 Persistent atrial fibrillation: Secondary | ICD-10-CM | POA: Diagnosis present

## 2017-07-24 DIAGNOSIS — I5022 Chronic systolic (congestive) heart failure: Secondary | ICD-10-CM | POA: Diagnosis present

## 2017-07-24 DIAGNOSIS — Z79899 Other long term (current) drug therapy: Secondary | ICD-10-CM

## 2017-07-24 DIAGNOSIS — K219 Gastro-esophageal reflux disease without esophagitis: Secondary | ICD-10-CM | POA: Diagnosis present

## 2017-07-24 DIAGNOSIS — Z9079 Acquired absence of other genital organ(s): Secondary | ICD-10-CM

## 2017-07-24 DIAGNOSIS — E785 Hyperlipidemia, unspecified: Secondary | ICD-10-CM | POA: Diagnosis present

## 2017-07-24 DIAGNOSIS — N390 Urinary tract infection, site not specified: Secondary | ICD-10-CM | POA: Diagnosis present

## 2017-07-24 DIAGNOSIS — E43 Unspecified severe protein-calorie malnutrition: Secondary | ICD-10-CM | POA: Diagnosis present

## 2017-07-24 DIAGNOSIS — Z951 Presence of aortocoronary bypass graft: Secondary | ICD-10-CM | POA: Diagnosis not present

## 2017-07-24 DIAGNOSIS — Z87891 Personal history of nicotine dependence: Secondary | ICD-10-CM | POA: Diagnosis not present

## 2017-07-24 DIAGNOSIS — Z79890 Hormone replacement therapy (postmenopausal): Secondary | ICD-10-CM

## 2017-07-24 DIAGNOSIS — A419 Sepsis, unspecified organism: Secondary | ICD-10-CM | POA: Diagnosis present

## 2017-07-24 DIAGNOSIS — I2510 Atherosclerotic heart disease of native coronary artery without angina pectoris: Secondary | ICD-10-CM | POA: Diagnosis present

## 2017-07-24 DIAGNOSIS — E039 Hypothyroidism, unspecified: Secondary | ICD-10-CM | POA: Diagnosis present

## 2017-07-24 DIAGNOSIS — F0390 Unspecified dementia without behavioral disturbance: Secondary | ICD-10-CM | POA: Diagnosis present

## 2017-07-24 DIAGNOSIS — D709 Neutropenia, unspecified: Secondary | ICD-10-CM | POA: Diagnosis present

## 2017-07-24 DIAGNOSIS — C61 Malignant neoplasm of prostate: Secondary | ICD-10-CM | POA: Diagnosis present

## 2017-07-24 DIAGNOSIS — Z515 Encounter for palliative care: Secondary | ICD-10-CM | POA: Diagnosis not present

## 2017-07-24 DIAGNOSIS — Z8249 Family history of ischemic heart disease and other diseases of the circulatory system: Secondary | ICD-10-CM | POA: Diagnosis not present

## 2017-07-24 DIAGNOSIS — N400 Enlarged prostate without lower urinary tract symptoms: Secondary | ICD-10-CM | POA: Diagnosis present

## 2017-07-24 DIAGNOSIS — R310 Gross hematuria: Secondary | ICD-10-CM | POA: Diagnosis present

## 2017-07-24 DIAGNOSIS — Z6822 Body mass index (BMI) 22.0-22.9, adult: Secondary | ICD-10-CM

## 2017-07-24 LAB — COMPREHENSIVE METABOLIC PANEL
ALK PHOS: 118 U/L (ref 38–126)
ALT: 9 U/L — AB (ref 17–63)
ANION GAP: 11 (ref 5–15)
AST: 31 U/L (ref 15–41)
Albumin: 2.7 g/dL — ABNORMAL LOW (ref 3.5–5.0)
BILIRUBIN TOTAL: 0.8 mg/dL (ref 0.3–1.2)
BUN: 16 mg/dL (ref 6–20)
CALCIUM: 8.2 mg/dL — AB (ref 8.9–10.3)
CO2: 22 mmol/L (ref 22–32)
CREATININE: 1.22 mg/dL (ref 0.61–1.24)
Chloride: 107 mmol/L (ref 101–111)
GFR calc non Af Amer: 50 mL/min — ABNORMAL LOW (ref >60)
GFR, EST AFRICAN AMERICAN: 58 mL/min — AB (ref >60)
Glucose, Bld: 149 mg/dL — ABNORMAL HIGH (ref 65–99)
Potassium: 3.3 mmol/L — ABNORMAL LOW (ref 3.5–5.1)
Sodium: 140 mmol/L (ref 135–145)
TOTAL PROTEIN: 6 g/dL — AB (ref 6.5–8.1)

## 2017-07-24 LAB — CBC WITH DIFFERENTIAL/PLATELET
BASOS ABS: 0 10*3/uL (ref 0–0.1)
BASOS PCT: 0 %
EOS PCT: 1 %
Eosinophils Absolute: 0 10*3/uL (ref 0–0.7)
HCT: 34.2 % — ABNORMAL LOW (ref 40.0–52.0)
Hemoglobin: 11.3 g/dL — ABNORMAL LOW (ref 13.0–18.0)
LYMPHS ABS: 0.2 10*3/uL — AB (ref 1.0–3.6)
Lymphocytes Relative: 7 %
MCH: 30 pg (ref 26.0–34.0)
MCHC: 33 g/dL (ref 32.0–36.0)
MCV: 91 fL (ref 80.0–100.0)
MONOS PCT: 1 %
Monocytes Absolute: 0 10*3/uL — ABNORMAL LOW (ref 0.2–1.0)
NEUTROS ABS: 2.7 10*3/uL (ref 1.4–6.5)
NEUTROS PCT: 91 %
PLATELETS: 202 10*3/uL (ref 150–440)
RBC: 3.76 MIL/uL — AB (ref 4.40–5.90)
RDW: 15.5 % — ABNORMAL HIGH (ref 11.5–14.5)
WBC: 3 10*3/uL — ABNORMAL LOW (ref 3.8–10.6)

## 2017-07-24 LAB — URINALYSIS, ROUTINE W REFLEX MICROSCOPIC
BILIRUBIN URINE: NEGATIVE
Bacteria, UA: NONE SEEN
GLUCOSE, UA: NEGATIVE mg/dL
Ketones, ur: NEGATIVE mg/dL
Leukocytes, UA: NEGATIVE
NITRITE: NEGATIVE
PH: 6 (ref 5.0–8.0)
Protein, ur: 100 mg/dL — AB
SPECIFIC GRAVITY, URINE: 1.009 (ref 1.005–1.030)
Squamous Epithelial / LPF: NONE SEEN

## 2017-07-24 LAB — GLUCOSE, CAPILLARY: Glucose-Capillary: 123 mg/dL — ABNORMAL HIGH (ref 65–99)

## 2017-07-24 LAB — LACTIC ACID, PLASMA
Lactic Acid, Venous: 5.5 mmol/L (ref 0.5–1.9)
Lactic Acid, Venous: 6.9 mmol/L (ref 0.5–1.9)

## 2017-07-24 LAB — TROPONIN I: Troponin I: 0.03 ng/mL (ref <0.03)

## 2017-07-24 LAB — PROTIME-INR
INR: 1.35
PROTHROMBIN TIME: 16.6 s — AB (ref 11.4–15.2)

## 2017-07-24 MED ORDER — LEVOTHYROXINE SODIUM 50 MCG PO TABS: 50.0000 ug | ORAL_TABLET | Freq: Every day | ORAL | Status: DC

## 2017-07-24 MED ORDER — ACETAMINOPHEN 325 MG RE SUPP
RECTAL | Status: AC
  Administered 2017-07-24: 650 mg via RECTAL
  Filled 2017-07-24: qty 2

## 2017-07-24 MED ORDER — TAMSULOSIN HCL 0.4 MG PO CAPS: 0.4000 mg | ORAL_CAPSULE | Freq: Every day | ORAL | Status: DC

## 2017-07-24 MED ORDER — ROSUVASTATIN CALCIUM 10 MG PO TABS: 5.0000 mg | ORAL_TABLET | Freq: Every day | ORAL | Status: DC

## 2017-07-24 MED ORDER — SODIUM CHLORIDE 0.9 % IV BOLUS (SEPSIS)
1000.0000 mL | Freq: Once | INTRAVENOUS | Status: AC
  Administered 2017-07-24: 1000 mL via INTRAVENOUS

## 2017-07-24 MED ORDER — METOPROLOL TARTRATE 5 MG/5ML IV SOLN
5.0000 mg | INTRAVENOUS | Status: DC | PRN
  Administered 2017-07-25: 5 mg via INTRAVENOUS
  Filled 2017-07-24: qty 5

## 2017-07-24 MED ORDER — ACETAMINOPHEN 325 MG PO TABS: 650.0000 mg | ORAL_TABLET | Freq: Four times a day (QID) | ORAL | Status: DC | PRN

## 2017-07-24 MED ORDER — ALBUTEROL SULFATE (2.5 MG/3ML) 0.083% IN NEBU: 2.5000 mg | INHALATION_SOLUTION | Freq: Four times a day (QID) | RESPIRATORY_TRACT | Status: DC | PRN

## 2017-07-24 MED ORDER — SACCHAROMYCES BOULARDII 250 MG PO CAPS
250.0000 mg | ORAL_CAPSULE | Freq: Every day | ORAL | Status: DC
  Filled 2017-07-24: qty 1

## 2017-07-24 MED ORDER — MELATONIN 5 MG PO TABS
5.0000 mg | ORAL_TABLET | Freq: Every day | ORAL | Status: DC
  Filled 2017-07-24: qty 1

## 2017-07-24 MED ORDER — ACETAMINOPHEN 650 MG RE SUPP
650.0000 mg | Freq: Four times a day (QID) | RECTAL | Status: DC | PRN
  Administered 2017-07-24: 650 mg via RECTAL

## 2017-07-24 MED ORDER — SODIUM CHLORIDE 0.9 % IV BOLUS (SEPSIS)
250.0000 mL | Freq: Once | INTRAVENOUS | Status: AC
  Administered 2017-07-24: 250 mL via INTRAVENOUS

## 2017-07-24 MED ORDER — CALCIUM CARBONATE-VITAMIN D 500-200 MG-UNIT PO TABS: 1.0000 | ORAL_TABLET | Freq: Every day | ORAL | Status: DC

## 2017-07-24 MED ORDER — FINASTERIDE 5 MG PO TABS: 5.0000 mg | ORAL_TABLET | ORAL | Status: DC

## 2017-07-24 MED ORDER — NOREPINEPHRINE BITARTRATE 1 MG/ML IV SOLN
0.0000 ug/min | INTRAVENOUS | Status: DC
  Administered 2017-07-24: 2 ug/min via INTRAVENOUS
  Administered 2017-07-25: 15 ug/min via INTRAVENOUS
  Filled 2017-07-24 (×2): qty 4

## 2017-07-24 MED ORDER — SODIUM CHLORIDE 0.9 % IV SOLN
INTRAVENOUS | Status: DC
  Administered 2017-07-24: 23:00:00 via INTRAVENOUS

## 2017-07-24 MED ORDER — SERTRALINE HCL 50 MG PO TABS: 50.0000 mg | ORAL_TABLET | Freq: Every day | ORAL | Status: DC

## 2017-07-24 MED ORDER — PANTOPRAZOLE SODIUM 40 MG PO TBEC: 40.0000 mg | DELAYED_RELEASE_TABLET | Freq: Every day | ORAL | Status: DC

## 2017-07-24 MED ORDER — ADULT MULTIVITAMIN W/MINERALS CH: 1.0000 | ORAL_TABLET | Freq: Every day | ORAL | Status: DC

## 2017-07-24 MED ORDER — ONDANSETRON HCL 4 MG PO TABS: 4.0000 mg | ORAL_TABLET | Freq: Four times a day (QID) | ORAL | Status: DC | PRN

## 2017-07-24 MED ORDER — DIVALPROEX SODIUM 125 MG PO CSDR
250.0000 mg | DELAYED_RELEASE_CAPSULE | Freq: Three times a day (TID) | ORAL | Status: DC
  Filled 2017-07-24 (×2): qty 2

## 2017-07-24 MED ORDER — MIDODRINE HCL 5 MG PO TABS
10.0000 mg | ORAL_TABLET | Freq: Two times a day (BID) | ORAL | Status: DC
  Filled 2017-07-24 (×2): qty 2

## 2017-07-24 MED ORDER — DONEPEZIL HCL 10 MG PO TABS
10.0000 mg | ORAL_TABLET | Freq: Every evening | ORAL | Status: DC
  Filled 2017-07-24: qty 1

## 2017-07-24 MED ORDER — ACETAMINOPHEN 500 MG PO TABS: 500.0000 mg | ORAL_TABLET | ORAL | Status: DC | PRN

## 2017-07-24 MED ORDER — FERROUS SULFATE 325 (65 FE) MG PO TABS: 325.0000 mg | ORAL_TABLET | Freq: Two times a day (BID) | ORAL | Status: DC

## 2017-07-24 MED ORDER — PIPERACILLIN-TAZOBACTAM 3.375 G IVPB 30 MIN
3.3750 g | Freq: Once | INTRAVENOUS | Status: AC
  Administered 2017-07-24: 3.375 g via INTRAVENOUS
  Filled 2017-07-24: qty 50

## 2017-07-24 MED ORDER — RISPERIDONE 0.5 MG PO TABS
0.5000 mg | ORAL_TABLET | Freq: Every day | ORAL | Status: DC
  Filled 2017-07-24: qty 1

## 2017-07-24 MED ORDER — VANCOMYCIN HCL IN DEXTROSE 1-5 GM/200ML-% IV SOLN
1000.0000 mg | Freq: Once | INTRAVENOUS | Status: AC
  Administered 2017-07-24: 1000 mg via INTRAVENOUS
  Filled 2017-07-24: qty 200

## 2017-07-24 MED ORDER — ONDANSETRON HCL 4 MG/2ML IJ SOLN: 4.0000 mg | Freq: Four times a day (QID) | INTRAMUSCULAR | Status: DC | PRN

## 2017-07-24 MED ORDER — BICALUTAMIDE 50 MG PO TABS
50.0000 mg | ORAL_TABLET | Freq: Every day | ORAL | Status: DC
  Filled 2017-07-24: qty 1

## 2017-07-24 MED ORDER — ALPRAZOLAM 0.25 MG PO TABS: 0.2500 mg | ORAL_TABLET | Freq: Three times a day (TID) | ORAL | Status: DC | PRN

## 2017-07-24 MED ORDER — POLYETHYLENE GLYCOL 3350 17 G PO PACK: 17.0000 g | PACK | Freq: Every day | ORAL | Status: DC | PRN

## 2017-07-24 NOTE — ED Notes (Signed)
Patient is resting with eyes closed. Even and non labored respirations noted. Patient is resting much more comfortably than earlier. Patient daughter is at bedside and requesting for him to have pain medication. Patient scores a zero on the PAINAD pain scale.

## 2017-07-24 NOTE — Consult Note (Signed)
Name: Adam Nielsen MRN: [id number] DOB: 1925-06-10    ADMISSION DATE:  07/23/2017  CONSULTATION DATE:  07/31/2017  REFERRING MD : Dr.Mody  CHIEF COMPLAINT:  Hematuria  BRIEF PATIENT DESCRIPTION: 83 year old male with septic shock related to UTI and hematuria.  SIGNIFICANT EVENTS  11/7 Patient admitted to the SDU with septic shock related to hematuria and UTI  STUDIES:  12/14/15 ECHO>>The cavity size was normal. There was mild focal  basal hypertrophy of the septum. Systolic function was moderately  reduced. The estimated ejection fraction was in the range of 35%  to 40%.   HISTORY OF PRESENT ILLNESS:  Adam Nielsen is a 83 year old male with known history of dementia,BPH ,Metastatic Prostate cancer s/p TURP 2017,CAD S/P CABG, Chronic Systolic Heart failure with EF of 35-40% and Atrial fibrillation.  Patient presented to [facility] with hematuria and noted to have UTI.  Patient was started on antibiotics.  He was noted to be in septic shock with Lactic acid -6.9, severe neutropenia .  Patient was started on pressors and sent to SDU for further management.  PAST MEDICAL HISTORY :   has a past medical history of Acute bronchitis (11/22/2015 ), BPH (benign prostatic hypertrophy), CAD (coronary artery disease), Chronic indwelling Foley catheter, Dementia, Dementia, Fall, Hyperlipidemia, Hypothyroid, PAF (paroxysmal atrial fibrillation) ([place]), Prostate CA ([place]), and Urinary tract infection (11/22/2015 ).  has a past surgical history that includes Coronary artery bypass graft; Ankle surgery; Bone graft hip iliac crest; and TRANSURETHRAL RESECTION OF THE PROSTATE (N/A, 12/12/2015). Prior to Admission medications   Medication Sig Start Date End Date Taking? Authorizing Provider  ALPRAZolam (XANAX) 0.25 MG tablet Take 1 tablet (0.25 mg total) by mouth 3 (three) times daily as needed for anxiety. For agitation 12/10/16  Yes Vaughan Basta, MD  bicalutamide (CASODEX) 50 MG tablet Take 50 mg by mouth  daily. Reported on 12/13/2015   Yes [provider]  calcium-vitamin D (OSCAL WITH D) 500-200 MG-UNIT tablet Take 1 tablet by mouth daily with breakfast.   Yes [provider]  divalproex (DEPAKOTE SPRINKLE) 125 MG capsule Take 250 mg 3 (three) times daily by mouth.    Yes [provider]  donepezil (ARICEPT) 10 MG tablet Take 10 mg by mouth every evening.    Yes [provider]  ferrous sulfate 325 (65 FE) MG tablet Take 1 tablet (325 mg total) by mouth 2 (two) times daily with a meal. 03/22/16  Yes Sudini, Srikar, MD  finasteride (PROSCAR) 5 MG tablet Take 5 mg every other day by mouth.    Yes [provider]  furosemide (LASIX) 20 MG tablet Take 1 tablet (20 mg total) by mouth every other day. Patient taking differently: Take 20 mg by mouth daily.  12/25/16 07/25/2017 Yes Gollan, Kathlene November, MD  levothyroxine (SYNTHROID, LEVOTHROID) 50 MCG tablet Take 50 mcg by mouth daily before breakfast.   Yes [provider]  Melatonin 5 MG TABS Take 5 mg by mouth at bedtime.   Yes [provider]  midodrine (PROAMATINE) 10 MG tablet Take 1 tablet (10 mg total) by mouth 2 (two) times daily. 12/25/16  Yes Minna Merritts, MD  Multiple Vitamin (MULTIVITAMIN WITH MINERALS) TABS tablet Take 1 tablet by mouth daily.   Yes [provider]  pantoprazole (PROTONIX) 40 MG tablet Take 1 tablet (40 mg total) by mouth daily. 03/22/16  Yes Sudini, Alveta Heimlich, MD  potassium chloride (K-DUR) 10 MEQ tablet Take 1 tablet (10 mEq total) by  mouth every other day. 12/25/16 07/18/2017 Yes Gollan, Kathlene November, MD  risperiDONE (RISPERDAL) 0.5 MG tablet Take 0.5 mg at bedtime by mouth.    Yes [provider]  rosuvastatin (CRESTOR) 5 MG tablet Take 1 tablet (5 mg total) by mouth daily at 6 PM. 12/21/15  Yes Vaughan Basta, MD  saccharomyces boulardii (FLORASTOR) 250 MG capsule Take 250 mg daily by mouth.    Yes [provider]  sertraline (ZOLOFT) 50 MG  tablet Take 50 mg by mouth daily.    Yes [provider]  tamsulosin (FLOMAX) 0.4 MG CAPS capsule Take 1 capsule (0.4 mg total) by mouth daily after supper. 12/21/15  Yes Vaughan Basta, MD  acetaminophen (TYLENOL) 500 MG tablet Take 500 mg by mouth every 4 (four) hours as needed for mild pain or moderate pain.     [provider]  albuterol (PROVENTIL HFA;VENTOLIN HFA) 108 (90 Base) MCG/ACT inhaler Inhale 2 puffs into the lungs every 6 (six) hours as needed for wheezing or shortness of breath. 11/24/15   Loletha Grayer, MD  cephALEXin (KEFLEX) 500 MG capsule Take 1 capsule (500 mg total) by mouth 2 (two) times daily. Patient not taking: Reported on 07/27/2017 06/20/17   Lisa Roca, MD  guaifenesin (ROBITUSSIN) 100 MG/5ML syrup Take 200 mg by mouth every 6 (six) hours as needed for cough.    [provider]  magnesium hydroxide (MILK OF MAGNESIA) 400 MG/5ML suspension Take 30 mLs by mouth at bedtime as needed for mild constipation.    [provider]   No Known Allergies  FAMILY HISTORY:  family history includes Cancer in his brother; Heart disease in his father. SOCIAL HISTORY:  reports that he has quit smoking. he has never used smokeless tobacco. He reports that he does not drink alcohol or use drugs.  SUBJECTIVE: Patient is demented at baseline  VITAL SIGNS: Temp:  [98.6 F (37 C)-101.9 F (38.8 C)] 101.9 F (38.8 C) (11/07 2022) Pulse Rate:  [87-140] 134 (11/07 2047) Resp:  [16-25] 19 (11/07 1830) BP: (53-117)/(41-88) 59/48 (11/07 2040) SpO2:  [70 %-99 %] 94 % (11/07 2047) Weight:  [155 lb (70.3 kg)] 155 lb (70.3 kg) (11/07 1452)  PHYSICAL EXAMINATION: General:  Elderly Caucasian male, On NRB, appears to be in some distress Neuro:  Unable to assess HEENT:  AT,[place],no jvd Cardiovascular:  S1s2, regular, no m/r/g Lungs: diminished bilaterally, no wheezes,crackles and rhonchi Abdomen: soft.NT,ND Musculoskeletal:  No edema,cyanosis Skin:   Skin tear, bruises, blood around urinary catheter.  Recent Labs  Lab 08/14/2017 1617  NA 140  K 3.3*  CL 107  CO2 22  BUN 16  CREATININE 1.22  GLUCOSE 149*   Recent Labs  Lab 07/23/2017 1513  HGB 11.3*  HCT 34.2*  WBC 3.0*  PLT 202   Dg Chest Port 1 View  Result Date: 08/06/2017 CLINICAL DATA:  Hematuria and diarrhea. History of coronary artery disease and CABG, bronchitis, former smoker. Patient has dementia. EXAM: PORTABLE CHEST 1 VIEW COMPARISON:  Chest x-ray of June 23, 2017 FINDINGS: The lungs are adequately inflated. The interstitial markings are mildly prominent though stable. There is no alveolar infiltrate or pleural effusion. The heart and pulmonary vascularity are normal. The patient has undergone previous CABG. The bony thorax exhibits no acute abnormality. IMPRESSION: Mild chronic bronchitic changes, stable. No acute cardiopulmonary abnormality. Electronically Signed   By: David  Martinique M.D.   On: 08/13/2017 15:54    ASSESSMENT / PLAN:  Septic Shock related to UTI Hematuria  possibly related to underlying prostrate cancer  Hypokalemia Severe Neutropenia Mildly elevated troponin Hypoalbuminemia Hx of Prostate Cancer S/P TURP and BPH Hypothyroidism Hx of Dementia   Plan Urology consulted By EDP Continue Ceftriaxone Monitor Fever,cbc Continue Levophed KeEp MAP GOAL >55 Follow cultures Replace electrolytes  Continue Finasteride/flomax Continue Synthroid Continue Donepezil/Depakote/resperidone and Sertaline   Bincy Varughese,AG-ACNP Pulmonary and [place]   08/14/2017, 9:27 PM

## 2017-07-24 NOTE — ED Notes (Signed)
Patient talking out stating "let them out. Get them out of here". No one else in room besides this RN and patient. Patient moving all over stretcher. Will not remain still. Patient pulling at gown.

## 2017-07-24 NOTE — ED Provider Notes (Addendum)
[facility] Emergency Department Provider Note ____________________________________________   First MD Initiated Contact with Patient 07/23/2017 1503     (approximate)  I have reviewed the triage vital signs and the nursing notes.   HISTORY  Chief Complaint Altered Mental Status  Level V caveat: HPI limited by dementia  HPI Adam Nielsen is a 83 y.o. male with history of advanced dementia and other past medical history as noted below who presents with hematuria today, associated with diarrhea.  Patient unable to give any additional history.    Past Medical History:  Diagnosis Date  . Acute bronchitis 11/22/2015   . BPH (benign prostatic hypertrophy)   . CAD (coronary artery disease)    a. s/p CABG approx 1997; b. patient of Dr. Wynonia Lawman, MD  . Chronic indwelling Foley catheter   . Dementia   . Dementia   . Fall    11/22/2015    . Hyperlipidemia   . Hypothyroid   . PAF (paroxysmal atrial fibrillation) (HCC)    a. not on long term full dose anticoagulation  . Prostate CA ([place])   . Urinary tract infection 11/22/2015     Patient Active Problem List   Diagnosis Date Noted  . Hypoxia 12/06/2016  . GI bleed 03/19/2016  . CAD (coronary artery disease) 03/19/2016  . Pressure ulcer 12/18/2015  . Urinary retention   . Paroxysmal atrial fibrillation (HCC)   . Typical atrial flutter ([place])   . NSTEMI (non-ST elevated myocardial infarction) ([place]) 12/14/2015  . Atrial fibrillation with rapid ventricular response ([place])   . S/P TURP   . Hematuria   . S/P CABG (coronary artery bypass graft)   . Hypotension   . BPH (benign prostatic hypertrophy) with urinary retention 12/12/2015  . UTI (urinary tract infection) 11/23/2015  . UTI (lower urinary tract infection) 11/22/2015  . Prostate cancer ([place]) 11/22/2015  . Hypothyroidism 11/22/2015  . Atrial fibrillation ([place]) 11/22/2015  . Dementia 11/22/2015  . Fall 11/22/2015  . Bronchitis 11/22/2015    Past  Surgical History:  Procedure Laterality Date  . ANKLE SURGERY    . BONE GRAFT HIP ILIAC CREST    . CORONARY ARTERY BYPASS GRAFT      Prior to Admission medications   Medication Sig Start Date End Date Taking? Authorizing Provider  ALPRAZolam (XANAX) 0.25 MG tablet Take 1 tablet (0.25 mg total) by mouth 3 (three) times daily as needed for anxiety. For agitation 12/10/16  Yes Vaughan Basta, MD  bicalutamide (CASODEX) 50 MG tablet Take 50 mg by mouth daily. Reported on 12/13/2015   Yes [provider]  calcium-vitamin D (OSCAL WITH D) 500-200 MG-UNIT tablet Take 1 tablet by mouth daily with breakfast.   Yes [provider]  divalproex (DEPAKOTE SPRINKLE) 125 MG capsule Take 250 mg 3 (three) times daily by mouth.    Yes [provider]  donepezil (ARICEPT) 10 MG tablet Take 10 mg by mouth every evening.    Yes [provider]  ferrous sulfate 325 (65 FE) MG tablet Take 1 tablet (325 mg total) by mouth 2 (two) times daily with a meal. 03/22/16  Yes Sudini, Srikar, MD  finasteride (PROSCAR) 5 MG tablet Take 5 mg every other day by mouth.    Yes [provider]  furosemide (LASIX) 20 MG tablet Take 1 tablet (20 mg total) by mouth every other day. Patient taking differently: Take 20 mg by mouth daily.  12/25/16 08/13/2017 Yes Gollan, Kathlene November, MD  levothyroxine (SYNTHROID,  LEVOTHROID) 50 MCG tablet Take 50 mcg by mouth daily before breakfast.   Yes [provider]  Melatonin 5 MG TABS Take 5 mg by mouth at bedtime.   Yes [provider]  midodrine (PROAMATINE) 10 MG tablet Take 1 tablet (10 mg total) by mouth 2 (two) times daily. 12/25/16  Yes Minna Merritts, MD  Multiple Vitamin (MULTIVITAMIN WITH MINERALS) TABS tablet Take 1 tablet by mouth daily.   Yes [provider]  pantoprazole (PROTONIX) 40 MG tablet Take 1 tablet (40 mg total) by mouth daily. 03/22/16  Yes Sudini, Alveta Heimlich, MD  potassium chloride (K-DUR) 10 MEQ tablet  Take 1 tablet (10 mEq total) by mouth every other day. 12/25/16 07/18/2017 Yes Gollan, Kathlene November, MD  risperiDONE (RISPERDAL) 0.5 MG tablet Take 0.5 mg at bedtime by mouth.    Yes [provider]  rosuvastatin (CRESTOR) 5 MG tablet Take 1 tablet (5 mg total) by mouth daily at 6 PM. 12/21/15  Yes Vaughan Basta, MD  saccharomyces boulardii (FLORASTOR) 250 MG capsule Take 250 mg daily by mouth.    Yes [provider]  sertraline (ZOLOFT) 50 MG tablet Take 50 mg by mouth daily.    Yes [provider]  tamsulosin (FLOMAX) 0.4 MG CAPS capsule Take 1 capsule (0.4 mg total) by mouth daily after supper. 12/21/15  Yes Vaughan Basta, MD  acetaminophen (TYLENOL) 500 MG tablet Take 500 mg by mouth every 4 (four) hours as needed for mild pain or moderate pain.     [provider]  albuterol (PROVENTIL HFA;VENTOLIN HFA) 108 (90 Base) MCG/ACT inhaler Inhale 2 puffs into the lungs every 6 (six) hours as needed for wheezing or shortness of breath. 11/24/15   Loletha Grayer, MD  cephALEXin (KEFLEX) 500 MG capsule Take 1 capsule (500 mg total) by mouth 2 (two) times daily. Patient not taking: Reported on 07/30/2017 06/20/17   Lisa Roca, MD  guaifenesin (ROBITUSSIN) 100 MG/5ML syrup Take 200 mg by mouth every 6 (six) hours as needed for cough.    [provider]  magnesium hydroxide (MILK OF MAGNESIA) 400 MG/5ML suspension Take 30 mLs by mouth at bedtime as needed for mild constipation.    [provider]    Allergies Patient has no known allergies.  Family History  Problem Relation Age of Onset  . Heart disease Father   . Cancer Brother     Social History Social History   Tobacco Use  . Smoking status: Former Research scientist (life sciences)  . Smokeless tobacco: Never Used  Substance Use Topics  . Alcohol use: No    Alcohol/week: 0.0 oz  . Drug use: No    Review of Systems Level V caveat: Unable to obtain review of systems due to advanced  dementia    ____________________________________________   PHYSICAL EXAM:  VITAL SIGNS: ED Triage Vitals  Enc Vitals Group     BP 08/12/2017 1507 (!) 97/57     Pulse Rate 07/23/2017 1507 (!) 140     Resp 08/06/2017 1507 18     Temp 07/18/2017 1507 98.6 F (37 C)     Temp Source 08/01/2017 1507 Oral     SpO2 08/07/2017 1507 96 %     Weight 08/10/2017 1452 155 lb (70.3 kg)     Height 07/20/2017 1452 5\' 5"  (1.651 m)     Head Circumference --      Peak Flow --      Pain Score --      Pain Loc --  Pain Edu? --      Excl. in [place]? --     Constitutional: Alert, uncomfortable appearing.  Eyes: Conjunctivae are normal. EOMI. PERRLA.  Head: Atraumatic. Nose: No congestion/rhinnorhea. Mouth/Throat: Mucous membranes are dry.   Neck: Normal range of motion.  Cardiovascular: Tachycardic, irregular rhythm. Grossly normal heart sounds.  Good peripheral circulation. Respiratory: Normal respiratory effort.  No retractions. Lungs CTAB. Gastrointestinal: Soft and nontender. No distention.  Genitourinary: No CVA tenderness. Musculoskeletal: No lower extremity edema.  Extremities warm and well perfused.  Neurologic:  Normal speech.  Moving all extremities.  No gross focal neurologic deficits are appreciated.  Skin:  Skin is warm and dry. No rash noted. Psychiatric: Mood and affect are normal. Speech and behavior are normal.  ____________________________________________   LABS (all labs ordered are listed, but only abnormal results are displayed)  Labs Reviewed  LACTIC ACID, PLASMA - Abnormal; Notable for the following components:      Result Value   Lactic Acid, Venous 5.5 (*)    All other components within normal limits  CBC WITH DIFFERENTIAL/PLATELET - Abnormal; Notable for the following components:   WBC 3.0 (*)    RBC 3.76 (*)    Hemoglobin 11.3 (*)    HCT 34.2 (*)    RDW 15.5 (*)    Lymphs Abs 0.2 (*)    Monocytes Absolute 0.0 (*)    All other components within normal limits   PROTIME-INR - Abnormal; Notable for the following components:   Prothrombin Time 16.6 (*)    All other components within normal limits  URINALYSIS, ROUTINE W REFLEX MICROSCOPIC - Abnormal; Notable for the following components:   Color, Urine RED (*)    APPearance CLOUDY (*)    Hgb urine dipstick MODERATE (*)    Protein, ur 100 (*)    All other components within normal limits  COMPREHENSIVE METABOLIC PANEL - Abnormal; Notable for the following components:   Potassium 3.3 (*)    Glucose, Bld 149 (*)    Calcium 8.2 (*)    Total Protein 6.0 (*)    Albumin 2.7 (*)    ALT 9 (*)    GFR calc non Af Amer 50 (*)    GFR calc Af Amer 58 (*)    All other components within normal limits  TROPONIN I - Abnormal; Notable for the following components:   Troponin I 0.03 (*)    All other components within normal limits  CULTURE, BLOOD (ROUTINE X 2)  CULTURE, BLOOD (ROUTINE X 2)  C DIFFICILE QUICK SCREEN W PCR REFLEX  LACTIC ACID, PLASMA   ____________________________________________  EKG  ED ECG REPORT I, Arta Silence, the attending physician, personally viewed and interpreted this ECG.  Date: 08/12/2017 EKG Time: 1530 Rate: 145 Rhythm: Atrial fibrillation with RVR QRS Axis: normal Intervals: normal ST/T Wave abnormalities: Nonspecific Narrative Interpretation: no evidence of acute ischemia; other than rate, no significant change when compared to EKG of 12/25/2016  ____________________________________________  RADIOLOGY  CXR: No focal infiltrate  ____________________________________________   PROCEDURES  Procedure(s) performed: No    Critical Care performed: Yes  CRITICAL CARE Performed by: Arta Silence   Total critical care time: 40 minutes  Critical care time was exclusive of separately billable procedures and treating other patients.  Critical care was necessary to treat or prevent imminent or life-threatening deterioration.  Critical care was time  spent personally by me on the following activities: development of treatment plan with patient and/or surrogate as well as nursing, discussions with  consultants, evaluation of patient's response to treatment, examination of patient, obtaining history from patient or surrogate, ordering and performing treatments and interventions, ordering and review of laboratory studies, ordering and review of radiographic studies, pulse oximetry and re-evaluation of patient's condition. ____________________________________________   INITIAL IMPRESSION / ASSESSMENT AND PLAN / ED COURSE  Pertinent labs & imaging results that were available during my care of the patient were reviewed by me and considered in my medical decision making (see chart for details).  83 year old male with past medical history as noted above presents with hematuria today, as well as diarrhea.  Patient unable to provide history due to dementia.  Nursing home referral paperwork and EMS report do not provide significantly more information.  View of past medical records in epic is primarily significant for the presentation for weakness and elevated blood pressure last month, as well as another ED presentation last month for fall.  I also reviewed patient's last echo from last year which shows EF of 35-40%.  Patient has no blood, urine, or stool cultures within the last 6 months.  On exam, patient is alert able to say his name but otherwise disoriented and unable to provide history or follow most commands.  His baseline mental status is unclear, but based on prior ED visit notes this appears to be consistent with his baseline.  Patient is tachycardic to 140s and appears in rapid A. fib, with borderline blood pressure.  Vital signs are otherwise normal.  Differential includes UTI versus possible noninfectious cause of hematuria such as bladder mass.  Vital signs most consistent with sepsis versus dehydration; I have very low suspicion for significant  acute blood loss from the hematuria.     Plan: UA and sepsis workup, basic labs, IV fluids, monitor and reassess.    Clinical Course as of Jul 24 1757  Wed Jul 24, 2017  1627 Lactic acid elevated.  Will start empiric abx and additional fluids.  WBC low from baseline, otherwise CBC consistent with baseline.   [SS]    Clinical Course User Index [SS] Arta Silence, MD    ----------------------------------------- 5:58 PM on 07/25/2017 -----------------------------------------   Lab workup consistent with sepsis.  UA grossly bloody and is most likely source of infection.  Empiric antibiotics given, and patient is getting fluids with improvement in vital signs.  I discussed the case with the hospitalist and will proceed with admission.  I spent 10 minutes discussing goals of care and the plan of care with the patient's daughter.  I also consulted Dr. Hollice Espy of urology to evaluate the patient for the hematuria. ____________________________________________   FINAL CLINICAL IMPRESSION(S) / ED DIAGNOSES  Final diagnoses:  Lower urinary tract infectious disease  Sepsis, due to unspecified organism ([place])  Hematuria, unspecified type      NEW MEDICATIONS STARTED DURING THIS VISIT:  This SmartLink is deprecated. Use AVSMEDLIST instead to display the medication list for a patient.   Note:  This document was prepared using Dragon voice recognition software and may include unintentional dictation errors.    Arta Silence, MD 08/15/2017 Orville Govern, MD 08/02/2017 [phone]

## 2017-07-24 NOTE — Progress Notes (Signed)
eLink Physician-Brief Progress Note Patient Name: Adam Nielsen DOB: [birth year] MRN: [id number]   Date of Service  08/01/2017  HPI/Events of Note  65 M with  dementia,BPH ,Metastatic Prostate cancer s/p TURP 2017,CAD S/P CABG, Chronic Systolic Heart failure with EF of 35-40% and Atrial fibrillation.  Patient presented to [facility]  in septic shock from UTI with associated hematuria.   with hematuria and noted to have UTI.  Patient was started on ABX and pressors.  Transferred  to SDU for further management.  On camera check the patient is currently off pressors with BP of 115/90 (99).  HR is 119 and RR of 20 with sats of 100% on NRB.   eICU Interventions  Plan of care per primary admitting team Continue to monitor via [facility] Patient is a DNR     Intervention Category Evaluation Type: New Patient Evaluation  [name] 08/02/2017, 11:33 PM

## 2017-07-24 NOTE — ED Notes (Signed)
Attempted to place foley catheter per verbal order from Dr. Cherylann Banas. Unsuccessful. Call was placed for urology cart. Will attempt coude catheter insertion. Dr. Cherylann Banas made aware and in agreement.

## 2017-07-24 NOTE — ED Notes (Signed)
Pt placed on nonrebreather.  

## 2017-07-24 NOTE — ED Notes (Signed)
MD Willis at bedside. 

## 2017-07-24 NOTE — ED Triage Notes (Signed)
Patient presents to ED via [facility] from [facility] with c/o hematuria and diarrhea. Patient has dementia and is a poor historian.

## 2017-07-24 NOTE — Progress Notes (Signed)
ED visit made. Patient is currently followed by [facility] [place] [facility], with a hospice diagnosis of prostate cancer.  He is a DNR code with out facility DNR in place. Patient was sent to the [facility] ED this afternoon due to blood coming from his penis.  Patient seen lying on the ED stretcher, appeared to be resting, daughter Jan at bedside. Writer introduced her self to Jan and explained her role at the hospital. Jan was appreciative of the visit and support.  Patient able to answer yes/no questions. Staff RN Terrence Dupont  placed foley catheter. Foley noted to be draining blood tinged urine. Patient is currently receiving IV fluids, and a dose of IV antibiotics as he meets sepsis criteria. Per discussion with attending ED physician Dr. Cherylann Banas plan will be for admission. Will continue to follow and update hospice team. Thank you. [name] RN, BSN, [place] [facility] [place], [place] [phone]

## 2017-07-24 NOTE — H&P (Signed)
[place] [facility] [place] NAME: Adam Nielsen    MR#:  [id number]  DATE OF BIRTH:  02-03-1925  DATE OF ADMISSION:  08/01/2017  PRIMARY CARE PHYSICIAN: Sarajane Jews, MD   REQUESTING/REFERRING PHYSICIAN: dr siadecki  CHIEF COMPLAINT:   hematuria HISTORY OF PRESENT ILLNESS:  Adam Nielsen  is a 83 y.o. male with a known history of severe dementia, metastatic prostate cancer status post TURP 2017, BPH, CAD status post CABG, chronic systolic heart failure ejection fraction 35-40% and Persistent AFIB who presents for memory care unit due to hematuria.  history of present illness is taken from the daughter was at bedside. Daughter received a call today the patient was having blood in his urine so was brought to the ER for further evaluation. Patient is patient of hospice and palliative care services.  Foley catheter was placed in the emergency room and gross hematuria was noted. It also appears that he may have urinary tract infection. He was treated with broad-spectrum antibiotics for sepsis. He was treated with vancomycin and Zosyn in the emergency department. PAST MEDICAL HISTORY:   Past Medical History:  Diagnosis Date  . Acute bronchitis 11/22/2015   . BPH (benign prostatic hypertrophy)   . CAD (coronary artery disease)    a. s/p CABG approx 1997; b. patient of Dr. Wynonia Lawman, MD  . Chronic indwelling Foley catheter   . Dementia   . Dementia   . Fall    11/22/2015    . Hyperlipidemia   . Hypothyroid   . PAF (paroxysmal atrial fibrillation) (HCC)    a. not on long term full dose anticoagulation  . Prostate CA ([place])   . Urinary tract infection 11/22/2015     PAST SURGICAL HISTORY:   Past Surgical History:  Procedure Laterality Date  . ANKLE SURGERY    . BONE GRAFT HIP ILIAC CREST    . CORONARY ARTERY BYPASS GRAFT      SOCIAL HISTORY:   Social History   Tobacco Use  . Smoking status: Former Research scientist (life sciences)  . Smokeless tobacco: Never Used   Substance Use Topics  . Alcohol use: No    Alcohol/week: 0.0 oz    FAMILY HISTORY:   Family History  Problem Relation Age of Onset  . Heart disease Father   . Cancer Brother     DRUG ALLERGIES:  No Known Allergies  REVIEW OF SYSTEMS:   Review of Systems  Unable to perform ROS: Dementia  Genitourinary: Positive for hematuria.    MEDICATIONS AT HOME:   Prior to Admission medications   Medication Sig Start Date End Date Taking? Authorizing Provider  ALPRAZolam (XANAX) 0.25 MG tablet Take 1 tablet (0.25 mg total) by mouth 3 (three) times daily as needed for anxiety. For agitation 12/10/16  Yes Vaughan Basta, MD  bicalutamide (CASODEX) 50 MG tablet Take 50 mg by mouth daily. Reported on 12/13/2015   Yes [provider]  calcium-vitamin D (OSCAL WITH D) 500-200 MG-UNIT tablet Take 1 tablet by mouth daily with breakfast.   Yes [provider]  divalproex (DEPAKOTE SPRINKLE) 125 MG capsule Take 250 mg 3 (three) times daily by mouth.    Yes [provider]  donepezil (ARICEPT) 10 MG tablet Take 10 mg by mouth every evening.    Yes [provider]  ferrous sulfate 325 (65 FE) MG tablet Take 1 tablet (325 mg total) by mouth 2 (two) times daily with a meal. 03/22/16  Yes Hillary Bow, MD  finasteride (PROSCAR) 5 MG tablet Take 5 mg every other day by mouth.    Yes [provider]  furosemide (LASIX) 20 MG tablet Take 1 tablet (20 mg total) by mouth every other day. Patient taking differently: Take 20 mg by mouth daily.  12/25/16 07/31/2017 Yes Gollan, Kathlene November, MD  levothyroxine (SYNTHROID, LEVOTHROID) 50 MCG tablet Take 50 mcg by mouth daily before breakfast.   Yes [provider]  Melatonin 5 MG TABS Take 5 mg by mouth at bedtime.   Yes [provider]  midodrine (PROAMATINE) 10 MG tablet Take 1 tablet (10 mg total) by mouth 2 (two) times daily. 12/25/16  Yes Minna Merritts, MD  Multiple Vitamin (MULTIVITAMIN WITH  MINERALS) TABS tablet Take 1 tablet by mouth daily.   Yes [provider]  pantoprazole (PROTONIX) 40 MG tablet Take 1 tablet (40 mg total) by mouth daily. 03/22/16  Yes Sudini, Alveta Heimlich, MD  potassium chloride (K-DUR) 10 MEQ tablet Take 1 tablet (10 mEq total) by mouth every other day. 12/25/16 07/20/2017 Yes Gollan, Kathlene November, MD  risperiDONE (RISPERDAL) 0.5 MG tablet Take 0.5 mg at bedtime by mouth.    Yes [provider]  rosuvastatin (CRESTOR) 5 MG tablet Take 1 tablet (5 mg total) by mouth daily at 6 PM. 12/21/15  Yes Vaughan Basta, MD  saccharomyces boulardii (FLORASTOR) 250 MG capsule Take 250 mg daily by mouth.    Yes [provider]  sertraline (ZOLOFT) 50 MG tablet Take 50 mg by mouth daily.    Yes [provider]  tamsulosin (FLOMAX) 0.4 MG CAPS capsule Take 1 capsule (0.4 mg total) by mouth daily after supper. 12/21/15  Yes Vaughan Basta, MD  acetaminophen (TYLENOL) 500 MG tablet Take 500 mg by mouth every 4 (four) hours as needed for mild pain or moderate pain.     [provider]  albuterol (PROVENTIL HFA;VENTOLIN HFA) 108 (90 Base) MCG/ACT inhaler Inhale 2 puffs into the lungs every 6 (six) hours as needed for wheezing or shortness of breath. 11/24/15   Loletha Grayer, MD  cephALEXin (KEFLEX) 500 MG capsule Take 1 capsule (500 mg total) by mouth 2 (two) times daily. Patient not taking: Reported on 08/09/2017 06/20/17   Lisa Roca, MD  guaifenesin (ROBITUSSIN) 100 MG/5ML syrup Take 200 mg by mouth every 6 (six) hours as needed for cough.    [provider]  magnesium hydroxide (MILK OF MAGNESIA) 400 MG/5ML suspension Take 30 mLs by mouth at bedtime as needed for mild constipation.    [provider]      VITAL SIGNS:  Blood pressure 94/62, pulse (!) 119, temperature 98.6 F (37 C), temperature source Oral, resp. rate 16, height 5\' 5"  (1.651 m), weight 70.3 kg (155 lb), SpO2 96 %.  PHYSICAL EXAMINATION:    Physical Exam  Constitutional: He is well-developed, well-nourished, and in no distress. No distress.  Chronically ill-appearing  HENT:  Head: Normocephalic.  Eyes: No scleral icterus.  Neck: Normal range of motion. Neck supple. No JVD present. No tracheal deviation present.  Cardiovascular: Normal rate, regular rhythm and normal heart sounds. Exam reveals no gallop and no friction rub.  No murmur heard. Pulmonary/Chest: Effort normal and breath sounds normal. No respiratory distress. He has no wheezes. He has no rales. He exhibits no tenderness.  Abdominal: Soft. Bowel sounds are normal. He exhibits no distension and no mass. There is no tenderness. There is no rebound and no guarding.  Musculoskeletal: Normal range of motion. He  exhibits no edema.  Neurological: He is alert.  Skin: Skin is warm. No rash noted. No erythema.  Psychiatric:  Dementia      LABORATORY PANEL:   CBC Recent Labs  Lab 08/10/2017 1513  WBC 3.0*  HGB 11.3*  HCT 34.2*  PLT 202   ------------------------------------------------------------------------------------------------------------------  Chemistries  Recent Labs  Lab 07/20/2017 1617  NA 140  K 3.3*  CL 107  CO2 22  GLUCOSE 149*  BUN 16  CREATININE 1.22  CALCIUM 8.2*  AST 31  ALT 9*  ALKPHOS 118  BILITOT 0.8   ------------------------------------------------------------------------------------------------------------------  Cardiac Enzymes Recent Labs  Lab 07/23/2017 1617  TROPONINI 0.03*   ------------------------------------------------------------------------------------------------------------------  RADIOLOGY:  Dg Chest Port 1 View  Result Date: 08/03/2017 CLINICAL DATA:  Hematuria and diarrhea. History of coronary artery disease and CABG, bronchitis, former smoker. Patient has dementia. EXAM: PORTABLE CHEST 1 VIEW COMPARISON:  Chest x-ray of June 23, 2017 FINDINGS: The lungs are adequately inflated. The interstitial  markings are mildly prominent though stable. There is no alveolar infiltrate or pleural effusion. The heart and pulmonary vascularity are normal. The patient has undergone previous CABG. The bony thorax exhibits no acute abnormality. IMPRESSION: Mild chronic bronchitic changes, stable. No acute cardiopulmonary abnormality. Electronically Signed   By: David  Martinique M.D.   On: 08/05/2017 15:54    EKG:   SVT HR 145 No ST elevation or depression   IMPRESSION AND PLAN:   83 year-old man with severe dementia, chronic systolic heart failure ejection fraction 35-40%, persistent atrial fibrillation and history of metastatic prostate cancer who presents with hematuria.    1. Hematuria: This is likely due to underlying prostate cancer and/or urinary tract infection Daughter would like urology consultation. ED physician to speak with urology consultant on call Start Rocephin Follow up on urine culture March her CBC   2. History of prostate cancer status post TURP and BPH: Continue finasteride and Flomax  3. History of dementia: Continue donepezil, Risperdal and Zoloft  4. Hypothyroidism: Continue Synthroid  5. Chronic systolic heart failure: Patient is currently not in exacerbation Monitor intake and output  6. Hypokalemia, mild: Replete and recheck in a.m.  7. Persistent atrial fibrillation followed by [facility]: Heart rate better controlled after hydration Not on anticoagulation due to high fall risk Metoprolol IV when necessary    All the records are reviewed and case discussed with ED provider. Management plans discussed with the patient's daughter in agreement  CODE STATUS: DNR on hospice  TOTAL TIME TAKING CARE OF THIS PATIENT: 41 minutes.    Tycho Cheramie M.D on 07/20/2017 at 6:09 PM  Between 7am to 6pm - Pager - [phone]  After 6pm go to [email or web address] - password [email or web address] [facility]  Office  [phone]  CC: Primary care physician; Sarajane Jews,  MD

## 2017-07-25 ENCOUNTER — Inpatient Hospital Stay

## 2017-07-25 ENCOUNTER — Other Ambulatory Visit: Payer: Self-pay

## 2017-07-25 DIAGNOSIS — A419 Sepsis, unspecified organism: Principal | ICD-10-CM

## 2017-07-25 DIAGNOSIS — R319 Hematuria, unspecified: Secondary | ICD-10-CM

## 2017-07-25 DIAGNOSIS — N390 Urinary tract infection, site not specified: Secondary | ICD-10-CM

## 2017-07-25 LAB — CBC
HEMATOCRIT: 31.6 % — AB (ref 40.0–52.0)
HEMOGLOBIN: 10.2 g/dL — AB (ref 13.0–18.0)
MCH: 29.4 pg (ref 26.0–34.0)
MCHC: 32.2 g/dL (ref 32.0–36.0)
MCV: 91.3 fL (ref 80.0–100.0)
Platelets: 160 10*3/uL (ref 150–440)
RBC: 3.46 MIL/uL — ABNORMAL LOW (ref 4.40–5.90)
RDW: 15.8 % — ABNORMAL HIGH (ref 11.5–14.5)
WBC: 26.4 10*3/uL — AB (ref 3.8–10.6)

## 2017-07-25 LAB — BLOOD CULTURE ID PANEL (REFLEXED)
ACINETOBACTER BAUMANNII: NOT DETECTED
CANDIDA ALBICANS: NOT DETECTED
CANDIDA KRUSEI: NOT DETECTED
CANDIDA PARAPSILOSIS: NOT DETECTED
Candida glabrata: NOT DETECTED
Candida tropicalis: NOT DETECTED
Carbapenem resistance: NOT DETECTED
ENTEROBACTER CLOACAE COMPLEX: NOT DETECTED
ENTEROBACTERIACEAE SPECIES: DETECTED — AB
ESCHERICHIA COLI: NOT DETECTED
Enterococcus species: NOT DETECTED
Haemophilus influenzae: NOT DETECTED
KLEBSIELLA OXYTOCA: NOT DETECTED
KLEBSIELLA PNEUMONIAE: DETECTED — AB
Listeria monocytogenes: NOT DETECTED
Neisseria meningitidis: NOT DETECTED
PSEUDOMONAS AERUGINOSA: NOT DETECTED
Proteus species: NOT DETECTED
STREPTOCOCCUS PYOGENES: NOT DETECTED
STREPTOCOCCUS SPECIES: NOT DETECTED
Serratia marcescens: NOT DETECTED
Staphylococcus aureus (BCID): NOT DETECTED
Staphylococcus species: NOT DETECTED
Streptococcus agalactiae: NOT DETECTED
Streptococcus pneumoniae: NOT DETECTED

## 2017-07-25 LAB — BASIC METABOLIC PANEL
ANION GAP: 12 (ref 5–15)
BUN: 17 mg/dL (ref 6–20)
CO2: 18 mmol/L — ABNORMAL LOW (ref 22–32)
Calcium: 8.2 mg/dL — ABNORMAL LOW (ref 8.9–10.3)
Chloride: 112 mmol/L — ABNORMAL HIGH (ref 101–111)
Creatinine, Ser: 1.58 mg/dL — ABNORMAL HIGH (ref 0.61–1.24)
GFR calc Af Amer: 43 mL/min — ABNORMAL LOW (ref >60)
GFR, EST NON AFRICAN AMERICAN: 37 mL/min — AB (ref >60)
Glucose, Bld: 101 mg/dL — ABNORMAL HIGH (ref 65–99)
POTASSIUM: 3.7 mmol/L (ref 3.5–5.1)
SODIUM: 142 mmol/L (ref 135–145)

## 2017-07-25 LAB — MRSA PCR SCREENING: MRSA BY PCR: POSITIVE — AB

## 2017-07-25 MED ORDER — MUPIROCIN 2 % EX OINT
1.0000 "application " | TOPICAL_OINTMENT | Freq: Two times a day (BID) | CUTANEOUS | Status: DC
  Administered 2017-07-25 (×2): 1 via NASAL
  Filled 2017-07-25: qty 22

## 2017-07-25 MED ORDER — CHLORHEXIDINE GLUCONATE CLOTH 2 % EX PADS: 6.0000 | MEDICATED_PAD | Freq: Every day | CUTANEOUS | Status: DC

## 2017-07-25 MED ORDER — SODIUM CHLORIDE 0.9 % IV SOLN
1.0000 mg/h | INTRAVENOUS | Status: DC
  Administered 2017-07-25: 2 mg/h via INTRAVENOUS
  Filled 2017-07-25: qty 10

## 2017-07-25 MED ORDER — CEFTRIAXONE SODIUM 1 G IJ SOLR
1.0000 g | INTRAMUSCULAR | Status: DC
  Administered 2017-07-25: 1 g via INTRAVENOUS
  Filled 2017-07-25: qty 10

## 2017-07-25 MED ORDER — LORAZEPAM 2 MG/ML IJ SOLN: 1.0000 mg | INTRAMUSCULAR | Status: DC | PRN

## 2017-07-25 MED ORDER — MORPHINE BOLUS VIA INFUSION
5.0000 mg | INTRAVENOUS | Status: DC | PRN
  Administered 2017-07-25: 2 mg via INTRAVENOUS
  Filled 2017-07-25: qty 20

## 2017-07-25 MED ORDER — DEXTROSE 5 % IV SOLN
2.0000 g | INTRAVENOUS | Status: DC
  Filled 2017-07-25: qty 2

## 2017-07-25 MED ORDER — SODIUM CHLORIDE 0.9 % IV SOLN
1.0000 g | Freq: Two times a day (BID) | INTRAVENOUS | Status: DC
  Administered 2017-07-25: 1 g via INTRAVENOUS
  Filled 2017-07-25 (×2): qty 1

## 2017-07-25 MED ORDER — LACTATED RINGERS IV SOLN: INTRAVENOUS | Status: DC

## 2017-07-25 NOTE — Progress Notes (Signed)
After further discussion with family, they have consented and agreed to comfort care measures. They have agreed to make patient comfortable. They feel that he has suffered and is moaning in pain.   Will proceed with comfort care measures.   amily are satisfied with Plan of action and management. All questions answered  Corrin Parker, M.D.  [facility]  Medical Director [place] Director [facility] Cardio-Pulmonary Department

## 2017-07-25 NOTE — Consult Note (Signed)
Urology Consult  I have been asked to see the patient by Dr. Jones Skene, for evaluation and management of urosepsis/prostate cancer/gross hematuria.  Chief Complaint: Gross hematuria  History of Present Illness: Adam Nielsen is a 83 y.o. year old multiple medical comorbidities currently living at a hospice facility brought into the emergency room with severe sepsis presumably from urinary source.  He also has a personal history of metastatic prostate cancer, history of urinary retention status post channel TURP by Dr. Karsten Ro in 2017.  Upon presentation to the emergency room, several attempts were made at a Foley catheter.  Ultimately, they were successful in placing a 14 [place] coud catheter which time gross hematuria was noted.  The catheter continues to drain well overnight and has since nearly cleared this morning.  This morning, the patient remains in the ICU on pressors for blood pressure stabilization.  He responds to stimuli but is not conversive.  His daughters at the bedside this morning.  Since the time of my initial consult with the patient was seen and examined this morning, the patient was been placed on comfort care measures.  Past Medical History:  Diagnosis Date  . Acute bronchitis 11/22/2015   . BPH (benign prostatic hypertrophy)   . CAD (coronary artery disease)    a. s/p CABG approx 1997; b. patient of Dr. Wynonia Lawman, MD  . Chronic indwelling Foley catheter   . Dementia   . Dementia   . Fall    11/22/2015    . Hyperlipidemia   . Hypothyroid   . PAF (paroxysmal atrial fibrillation) (HCC)    a. not on long term full dose anticoagulation  . Prostate CA ([place])   . Urinary tract infection 11/22/2015     Past Surgical History:  Procedure Laterality Date  . ANKLE SURGERY    . BONE GRAFT HIP ILIAC CREST    . CORONARY ARTERY BYPASS GRAFT      Home Medications:  Current Meds  Medication Sig  . ALPRAZolam (XANAX) 0.25 MG tablet Take 1 tablet (0.25 mg total) by  mouth 3 (three) times daily as needed for anxiety. For agitation  . bicalutamide (CASODEX) 50 MG tablet Take 50 mg by mouth daily. Reported on 12/13/2015  . calcium-vitamin D (OSCAL WITH D) 500-200 MG-UNIT tablet Take 1 tablet by mouth daily with breakfast.  . divalproex (DEPAKOTE SPRINKLE) 125 MG capsule Take 250 mg 3 (three) times daily by mouth.   . donepezil (ARICEPT) 10 MG tablet Take 10 mg by mouth every evening.   . ferrous sulfate 325 (65 FE) MG tablet Take 1 tablet (325 mg total) by mouth 2 (two) times daily with a meal.  . finasteride (PROSCAR) 5 MG tablet Take 5 mg every other day by mouth.   . furosemide (LASIX) 20 MG tablet Take 1 tablet (20 mg total) by mouth every other day. (Patient taking differently: Take 20 mg by mouth daily. )  . levothyroxine (SYNTHROID, LEVOTHROID) 50 MCG tablet Take 50 mcg by mouth daily before breakfast.  . Melatonin 5 MG TABS Take 5 mg by mouth at bedtime.  . midodrine (PROAMATINE) 10 MG tablet Take 1 tablet (10 mg total) by mouth 2 (two) times daily.  . Multiple Vitamin (MULTIVITAMIN WITH MINERALS) TABS tablet Take 1 tablet by mouth daily.  . pantoprazole (PROTONIX) 40 MG tablet Take 1 tablet (40 mg total) by mouth daily.  . potassium chloride (K-DUR) 10 MEQ tablet Take 1 tablet (10 mEq total) by mouth every  other day.  . risperiDONE (RISPERDAL) 0.5 MG tablet Take 0.5 mg at bedtime by mouth.   . rosuvastatin (CRESTOR) 5 MG tablet Take 1 tablet (5 mg total) by mouth daily at 6 PM.  . saccharomyces boulardii (FLORASTOR) 250 MG capsule Take 250 mg daily by mouth.   . sertraline (ZOLOFT) 50 MG tablet Take 50 mg by mouth daily.   . tamsulosin (FLOMAX) 0.4 MG CAPS capsule Take 1 capsule (0.4 mg total) by mouth daily after supper.    Allergies: No Known Allergies  Family History  Problem Relation Age of Onset  . Heart disease Father   . Cancer Brother     Social History:  reports that he has quit smoking. he has never used smokeless tobacco. He reports  that he does not drink alcohol or use drugs.  ROS: Given patient's clinical status, unable to provide review of systems today.  Physical Exam:  Vital signs in last 24 hours: Temp:  [98.6 F (37 C)-101.9 F (38.8 C)] 98.7 F (37.1 C) (11/08 0800) Pulse Rate:  [81-211] 104 (11/08 1100) Resp:  [12-26] 19 (11/08 1100) BP: (53-248)/(32-220) 180/166 (11/08 1100) SpO2:  [70 %-100 %] 96 % (11/08 1100) Weight:  [147 lb 14.9 oz (67.1 kg)-155 lb (70.3 kg)] 147 lb 14.9 oz (67.1 kg) (11/07 2249) Constitutional: Ill-appearing, elderly, in fetal position in ICU bed Abdomen: Soft, nontender GU: No CVA tenderness.  Circumcised phallus with Foley catheter in place, 33 [place] coud draining light pink urine with scant debris.  Scrotum unremarkable.   Laboratory Data:  Recent Labs    07/31/2017 1513 07/25/17 0447  WBC 3.0* 26.4*  HGB 11.3* 10.2*  HCT 34.2* 31.6*   Recent Labs    07/21/2017 1617 07/25/17 0447  NA 140 142  K 3.3* 3.7  CL 107 112*  CO2 22 18*  GLUCOSE 149* 101*  BUN 16 17  CREATININE 1.22 1.58*  CALCIUM 8.2* 8.2*   Recent Labs    07/23/2017 1513  INR 1.35   No results for input(s): LABURIN in the last 72 hours. Results for orders placed or performed during the hospital encounter of 07/18/2017  Blood Culture (routine x 2)     Status: None (Preliminary result)   Collection Time: 07/22/2017  3:13 PM  Result Value Ref Range Status   Specimen Description BLOOD LEFT ARM  Final   Special Requests   Final    BOTTLES DRAWN AEROBIC AND ANAEROBIC Blood Culture adequate volume   Culture  Setup Time   Final    GRAM NEGATIVE RODS IN BOTH AEROBIC AND ANAEROBIC BOTTLES CRITICAL RESULT CALLED TO, READ BACK BY AND VERIFIED WITH: DAVID BESANTI AT 0998 07/25/17 SDR    Culture GRAM NEGATIVE RODS  Final   Report Status PENDING  Incomplete  Blood Culture (routine x 2)     Status: None (Preliminary result)   Collection Time: 08/11/2017  3:13 PM  Result Value Ref Range Status   Specimen  Description BLOOD RIGHT ARM  Final   Special Requests   Final    BOTTLES DRAWN AEROBIC AND ANAEROBIC Blood Culture adequate volume   Culture  Setup Time   Final    GRAM NEGATIVE RODS IN BOTH AEROBIC AND ANAEROBIC BOTTLES CRITICAL VALUE NOTED.  VALUE IS CONSISTENT WITH PREVIOUSLY REPORTED AND CALLED VALUE.    Culture GRAM NEGATIVE RODS  Final   Report Status PENDING  Incomplete  Blood Culture ID Panel (Reflexed)     Status: Abnormal   Collection Time: 08/06/2017  3:13 PM  Result Value Ref Range Status   Enterococcus species NOT DETECTED NOT DETECTED Final   Listeria monocytogenes NOT DETECTED NOT DETECTED Final   Staphylococcus species NOT DETECTED NOT DETECTED Final   Staphylococcus aureus NOT DETECTED NOT DETECTED Final   Streptococcus species NOT DETECTED NOT DETECTED Final   Streptococcus agalactiae NOT DETECTED NOT DETECTED Final   Streptococcus pneumoniae NOT DETECTED NOT DETECTED Final   Streptococcus pyogenes NOT DETECTED NOT DETECTED Final   Acinetobacter baumannii NOT DETECTED NOT DETECTED Final   Enterobacteriaceae species DETECTED (A) NOT DETECTED Final    Comment: Enterobacteriaceae represent a large family of gram-negative bacteria, not a single organism. CRITICAL RESULT CALLED TO, READ BACK BY AND VERIFIED WITH:  DAVID BESANTI AT 7902 07/25/17 SDR    Enterobacter cloacae complex NOT DETECTED NOT DETECTED Final   Escherichia coli NOT DETECTED NOT DETECTED Final   Klebsiella oxytoca NOT DETECTED NOT DETECTED Final   Klebsiella pneumoniae DETECTED (A) NOT DETECTED Final    Comment: CRITICAL RESULT CALLED TO, READ BACK BY AND VERIFIED WITH: DAVID BESANTI AT 0649 07/25/17 SDR    Proteus species NOT DETECTED NOT DETECTED Final   Serratia marcescens NOT DETECTED NOT DETECTED Final   Carbapenem resistance NOT DETECTED NOT DETECTED Final   Haemophilus influenzae NOT DETECTED NOT DETECTED Final   Neisseria meningitidis NOT DETECTED NOT DETECTED Final   Pseudomonas aeruginosa  NOT DETECTED NOT DETECTED Final   Candida albicans NOT DETECTED NOT DETECTED Final   Candida glabrata NOT DETECTED NOT DETECTED Final   Candida krusei NOT DETECTED NOT DETECTED Final   Candida parapsilosis NOT DETECTED NOT DETECTED Final   Candida tropicalis NOT DETECTED NOT DETECTED Final  MRSA PCR Screening     Status: Abnormal   Collection Time: 07/23/2017 10:38 PM  Result Value Ref Range Status   MRSA by PCR POSITIVE (A) NEGATIVE Final    Comment:        The GeneXpert MRSA Assay (FDA approved for NASAL specimens only), is one component of a comprehensive MRSA colonization surveillance program. It is not intended to diagnose MRSA infection nor to guide or monitor treatment for MRSA infections. RESULT CALLED TO, READ BACK BY AND VERIFIED WITH: DALE HOBKINS AT 0106 07/25/17 ALV      Radiologic Imaging: US Renal  Result Date: 07/25/2017 CLINICAL DATA:  Sepsis. EXAM: RENAL / URINARY TRACT ULTRASOUND COMPLETE COMPARISON:  Abdomen and pelvis CT dated 12/23/2014. FINDINGS: Right Kidney: Length: 9.4 cm. Borderline increased echogenicity. No hydronephrosis. 2.7 x 2.5 x 2.5 cm exophytic, rounded, medium echotexture mass arising from the upper pole of the kidney. No internal blood flow with color Doppler. There was a 6.1 cm simple appearing cyst at that location on the previous CT. There is also a 1.5 cm partially exophytic cyst arising from the lower pole of the right kidney, containing a small number of internal echoes. No internal blood flow within that cyst with color Doppler. Left Kidney: Length: 10.2 cm. Poorly visualized. Grossly normal echotexture. No hydronephrosis. Bladder: Not distended, with a Foley catheter in place. IMPRESSION: 1. Interval decrease in size of an upper pole right renal cyst with hemorrhage or proteinaceous material in the fluid currently. 2. Borderline echogenic right kidney, possibly reflecting mild underlying medical renal disease. 3. No hydronephrosis.  Electronically Signed   By: Claudie Revering M.D.   On: 07/25/2017 09:17   Dg Chest Port 1 View  Result Date: 07/25/2017 CLINICAL DATA:  Hematuria and diarrhea. History of coronary artery disease and  CABG, bronchitis, former smoker. Patient has dementia. EXAM: PORTABLE CHEST 1 VIEW COMPARISON:  Chest x-ray of June 23, 2017 FINDINGS: The lungs are adequately inflated. The interstitial markings are mildly prominent though stable. There is no alveolar infiltrate or pleural effusion. The heart and pulmonary vascularity are normal. The patient has undergone previous CABG. The bony thorax exhibits no acute abnormality. IMPRESSION: Mild chronic bronchitic changes, stable. No acute cardiopulmonary abnormality. Electronically Signed   By: David  Martinique M.D.   On: 08/04/2017 15:54    Renal ultrasound personally reviewed today.  This was ordered earlier this morning by me.  Impression/plan:    1.  Metastatic prostate cancer GERD able to tolerate treatment.  Currently only on finasteride and Casodex.  Suspect prostate cancer has progressed.  No recent staging.   2.  Sepsis of urinary source Previously treated with broad-spectrum antibiotics, understand the patient is now on comfort care.  Renal ultrasound shows no urinary obstruction.  3.  Gross hematuria Improving, really related to prostate trauma, infection, and underlying prostate cancer   Given that the patient has now transitioned to comfort care, would maintain Foley catheter for comfort.  07/25/2017, 1:34 PM  Hollice Espy,  MD

## 2017-07-25 NOTE — Progress Notes (Signed)
Pharmacy Antibiotic Note  Adam Nielsen is a 83 y.o. male admitted on 07/30/2017 with UTI.  Pharmacy has been consulted for ceftriaxone dosing.  Plan: Patient received vanc/zosyn for sepsis protocol  Will start patient on ceftriaxone 1g IV daily for 5 days for UTI.  Height: 5\' 8"  (172.7 cm) Weight: 147 lb 14.9 oz (67.1 kg) IBW/kg (Calculated) : 68.4  Temp (24hrs), Avg:100.7 F (38.2 C), Min:98.6 F (37 C), Max:101.9 F (38.8 C)  Recent Labs  Lab 07/21/2017 1513 07/21/2017 1617 07/23/2017 1740  WBC 3.0*  --   --   CREATININE  --  1.22  --   LATICACIDVEN 5.5*  --  6.9*    Estimated Creatinine Clearance: 38.2 mL/min (by C-G formula based on SCr of 1.22 mg/dL).    No Known Allergies   Thank you for allowing pharmacy to be a part of this patient's care.  Tobie Lords, PharmD, BCPS Clinical Pharmacist 07/25/2017

## 2017-07-25 NOTE — Plan of Care (Signed)
Family educated re plan of care, medications administered, and services available.  They are interested in comfort care.  Palliative consult placed by writer with Dr Zoila Shutter approval

## 2017-07-25 NOTE — Progress Notes (Signed)
Visit made. Patient seen lying in bed, son Adam Nielsen and daughter in law [name] at bedside. Patient was admitted to the ICU last evening due to low BP in the ED. He repuired a non-rebreather and vasopressors. Prior to writer's visit CCM MD Dr. Mortimer Fries spoke with family and patient was made comfort care. Plan is for  Morphine drip to be initiated. Patient alert, but in obvious distress/discomfort. Writer provided education regarding use of continuous morphine and what to expect from it. They voiced their understanding and were appreciative of information. Emotional support provided and many stories shared. Hospice team made aware of family decision. Will continue to follow. Thank you. [name] RN, BSN, [facility] [place], hospital Liaison [phone] c

## 2017-07-25 NOTE — Progress Notes (Addendum)
Report called to [facility] on 1C, no larger rooms available at this time, pt will be in 108.  Family aware, pt will transfer in ICU bed w/2 liters [place] and tank, morphine infusing

## 2017-07-25 NOTE — Progress Notes (Signed)
[facility] NAME: Adam Nielsen    MR#:  [id number]  DATE OF BIRTH:  1925/02/16  SUBJECTIVE:  CHIEF COMPLAINT:   Chief Complaint  Patient presents with  . Altered Mental Status   Patient unresponsive.  Laying in bed on morphine drip.  Transition to comfort measures in the ICU earlier today.  REVIEW OF SYSTEMS:    Review of Systems  Unable to perform ROS: Mental status change    DRUG ALLERGIES:  No Known Allergies  VITALS:  Blood pressure (!) 180/166, pulse (!) 104, temperature 98.7 F (37.1 C), temperature source Oral, resp. rate 19, height 5\' 8"  (1.727 m), weight 67.1 kg (147 lb 14.9 oz), SpO2 96 %.  PHYSICAL EXAMINATION:   Physical Exam  GENERAL:  83 y.o.-year-old patient lying in the bed with no acute distress.  HEENT: Head atraumatic, normocephalic. NECK:  Supple, no jugular venous distention.  LUNGS: Normal breath sounds bilaterally, no wheezing, rales, rhonchi. No use of accessory muscles of respiration.  CARDIOVASCULAR: S1, S2  ABDOMEN: Soft, nontender. Foley in place EXTREMITIES: No cyanosis  PSYCHIATRIC: unresponsive  LABORATORY PANEL:   CBC Recent Labs  Lab 07/25/17 0447  WBC 26.4*  HGB 10.2*  HCT 31.6*  PLT 160   ------------------------------------------------------------------------------------------------------------------ Chemistries  Recent Labs  Lab 07/29/2017 1617 07/25/17 0447  NA 140 142  K 3.3* 3.7  CL 107 112*  CO2 22 18*  GLUCOSE 149* 101*  BUN 16 17  CREATININE 1.22 1.58*  CALCIUM 8.2* 8.2*  AST 31  --   ALT 9*  --   ALKPHOS 118  --   BILITOT 0.8  --    ------------------------------------------------------------------------------------------------------------------  Cardiac Enzymes Recent Labs  Lab 08/01/2017 1617  TROPONINI 0.03*   ------------------------------------------------------------------------------------------------------------------  RADIOLOGY:  US  Renal  Result Date: 07/25/2017 CLINICAL DATA:  Sepsis. EXAM: RENAL / URINARY TRACT ULTRASOUND COMPLETE COMPARISON:  Abdomen and pelvis CT dated 12/23/2014. FINDINGS: Right Kidney: Length: 9.4 cm. Borderline increased echogenicity. No hydronephrosis. 2.7 x 2.5 x 2.5 cm exophytic, rounded, medium echotexture mass arising from the upper pole of the kidney. No internal blood flow with color Doppler. There was a 6.1 cm simple appearing cyst at that location on the previous CT. There is also a 1.5 cm partially exophytic cyst arising from the lower pole of the right kidney, containing a small number of internal echoes. No internal blood flow within that cyst with color Doppler. Left Kidney: Length: 10.2 cm. Poorly visualized. Grossly normal echotexture. No hydronephrosis. Bladder: Not distended, with a Foley catheter in place. IMPRESSION: 1. Interval decrease in size of an upper pole right renal cyst with hemorrhage or proteinaceous material in the fluid currently. 2. Borderline echogenic right kidney, possibly reflecting mild underlying medical renal disease. 3. No hydronephrosis. Electronically Signed   By: Claudie Revering M.D.   On: 07/25/2017 09:17   Dg Chest Port 1 View  Result Date: 08/08/2017 CLINICAL DATA:  Hematuria and diarrhea. History of coronary artery disease and CABG, bronchitis, former smoker. Patient has dementia. EXAM: PORTABLE CHEST 1 VIEW COMPARISON:  Chest x-ray of June 23, 2017 FINDINGS: The lungs are adequately inflated. The interstitial markings are mildly prominent though stable. There is no alveolar infiltrate or pleural effusion. The heart and pulmonary vascularity are normal. The patient has undergone previous CABG. The bony thorax exhibits no acute abnormality. IMPRESSION: Mild chronic bronchitic changes, stable. No acute cardiopulmonary abnormality. Electronically Signed   By: David  Martinique M.D.  On: 08/05/2017 15:54     ASSESSMENT AND PLAN:   *UTI with severe  sepsis *Hematuria *Acute encephalopathy over dementia *Metastatic prostate cancer *Severe protein calorie malnutrition *Acute kidney injury  Patient was started on IV antibiotics and treated aggressively with IV fluids and a Foley catheter placed.  Admitted to ICU.  Was seen by intensivist, urology.  In spite of aggressive treatment patient continued to worsen and family decided to transition patient to comfort measures.  Continue morphine drip.   CODE STATUS: DNR  DVT Prophylaxis: SCDs  TOTAL TIME TAKING CARE OF THIS PATIENT: 20 minutes.    Hillary Bow R M.D on 07/25/2017 at 6:02 PM  Between 7am to 6pm - Pager - [phone]  After 6pm go to [email or web address] - password [email or web address] [facility]  Office  [phone]  CC: Primary care physician; Sarajane Jews, MD  Note: This dictation was prepared with Dragon dictation along with smaller phrase technology. Any transcriptional errors that result from this process are unintentional.

## 2017-07-25 NOTE — Progress Notes (Signed)
PHARMACY - PHYSICIAN COMMUNICATION CRITICAL VALUE ALERT - BLOOD CULTURE IDENTIFICATION (BCID)  Results for orders placed or performed during the hospital encounter of 07/21/2017  Blood Culture ID Panel (Reflexed) (Collected: 08/09/2017  3:13 PM)  Result Value Ref Range   Enterococcus species NOT DETECTED NOT DETECTED   Listeria monocytogenes NOT DETECTED NOT DETECTED   Staphylococcus species NOT DETECTED NOT DETECTED   Staphylococcus aureus NOT DETECTED NOT DETECTED   Streptococcus species NOT DETECTED NOT DETECTED   Streptococcus agalactiae NOT DETECTED NOT DETECTED   Streptococcus pneumoniae NOT DETECTED NOT DETECTED   Streptococcus pyogenes NOT DETECTED NOT DETECTED   Acinetobacter baumannii NOT DETECTED NOT DETECTED   Enterobacteriaceae species DETECTED (A) NOT DETECTED   Enterobacter cloacae complex NOT DETECTED NOT DETECTED   Escherichia coli NOT DETECTED NOT DETECTED   Klebsiella oxytoca NOT DETECTED NOT DETECTED   Klebsiella pneumoniae DETECTED (A) NOT DETECTED   Proteus species NOT DETECTED NOT DETECTED   Serratia marcescens NOT DETECTED NOT DETECTED   Carbapenem resistance NOT DETECTED NOT DETECTED   Haemophilus influenzae NOT DETECTED NOT DETECTED   Neisseria meningitidis NOT DETECTED NOT DETECTED   Pseudomonas aeruginosa NOT DETECTED NOT DETECTED   Candida albicans NOT DETECTED NOT DETECTED   Candida glabrata NOT DETECTED NOT DETECTED   Candida krusei NOT DETECTED NOT DETECTED   Candida parapsilosis NOT DETECTED NOT DETECTED   Candida tropicalis NOT DETECTED NOT DETECTED    Name of physician (or Provider) Contacted: Bincy Varughnese  Changes to prescribed antibiotics required: Patient is on ceftriaxone 1g IV daily will switch to meropenem 1g IV q12h per CrCl 26 - 50 ml/min for GNR bacteremia  Tobie Lords, PharmD, BCPS Clinical Pharmacist 07/25/2017

## 2017-07-25 NOTE — Progress Notes (Signed)
CH met with patient's family outside room. CH provided prayer and emotional support. CH is available for follow-up as needed.   07/25/17 0000  Clinical Encounter Type  Visited With Family;Patient not available;Health care provider  Visit Type Initial;Spiritual support  Referral From Nurse

## 2017-07-25 NOTE — Progress Notes (Signed)
Comfort measures begun, morphine gtt initiated, bolus given, monitor profile Comfot Care, BP cuff and O2 sensor removed.  Pt appears more comfortable.  Family at bedside

## 2017-07-25 NOTE — Progress Notes (Signed)
Transferred from CCU to 1C for comfort care. Family at bedside very involved and supportive. Pt has been unresponsive since arrival to floor, curled up on his side. Morphine gtt infusing- no other interventions needed to provide comfort. Hospice RN [name] meet with the patient's family earlier today. They are in agreement for keeping patient here at this time for comfort care.

## 2017-07-27 LAB — URINE CULTURE: Culture: >=100000 — AB

## 2017-07-27 LAB — CULTURE, BLOOD (ROUTINE X 2)
SPECIAL REQUESTS: ADEQUATE
Special Requests: ADEQUATE

## 2017-08-17 NOTE — Progress Notes (Signed)
Notified by pts son that pt had expired in room. Assessment completed by myself and Karen Chafe. Pt pronounced deceased at 39. Son at bedside. Morphine gtt stopped, IVs removed, oxygen removed. AC, chaplain, MD, [facility] notified. Support provided to family. Family remains at bedside with patient.

## 2017-08-17 NOTE — Death Summary Note (Signed)
DEATH SUMMARY   Patient Details  Name: Adam Nielsen MRN: [id number] DOB: 03-03-1925  Admission/Discharge Information   Admit Date:  08-20-17  Date of Death: Date of Death: 2017-08-22  Time of Death: Time of Death: 0232  Length of Stay: 2  Referring Physician: Sarajane Jews, MD   Reason(s) for Hospitalization  UTI  Diagnoses  Cause of death:   UTI  Secondary Diagnoses (including complications and co-morbidities):  Active Problems:   Hematuria   Septic shock (HCC)  *Hematuria *Acute encephalopathy over dementia *Metastatic prostate cancer *Severe protein calorie malnutrition *Acute kidney injury    Brief Hospital Course (including significant findings, care, treatment, and services provided and events leading to death)  *UTI with severe sepsis *Hematuria *Acute encephalopathy over dementia *Metastatic prostate cancer *Severe protein calorie malnutrition *Acute kidney injury  Patient was started on IV antibiotics and treated aggressively with IV fluids and a Foley catheter placed.  Admitted to ICU.  Was seen by intensivist, urology.  In spite of aggressive treatment patient continued to worsen and family decided to transition patient to comfort measures.  Continued on morphine drip.  Patient was pronounced dead at 2:32 AM on Aug 22, 2017.  Pertinent Labs and Studies  Significant Diagnostic Studies US Renal  Result Date: 07/25/2017 CLINICAL DATA:  Sepsis. EXAM: RENAL / URINARY TRACT ULTRASOUND COMPLETE COMPARISON:  Abdomen and pelvis CT dated 12/23/2014. FINDINGS: Right Kidney: Length: 9.4 cm. Borderline increased echogenicity. No hydronephrosis. 2.7 x 2.5 x 2.5 cm exophytic, rounded, medium echotexture mass arising from the upper pole of the kidney. No internal blood flow with color Doppler. There was a 6.1 cm simple appearing cyst at that location on the previous CT. There is also a 1.5 cm partially exophytic cyst arising from the lower pole of the right kidney,  containing a small number of internal echoes. No internal blood flow within that cyst with color Doppler. Left Kidney: Length: 10.2 cm. Poorly visualized. Grossly normal echotexture. No hydronephrosis. Bladder: Not distended, with a Foley catheter in place. IMPRESSION: 1. Interval decrease in size of an upper pole right renal cyst with hemorrhage or proteinaceous material in the fluid currently. 2. Borderline echogenic right kidney, possibly reflecting mild underlying medical renal disease. 3. No hydronephrosis. Electronically Signed   By: Claudie Revering M.D.   On: 07/25/2017 09:17   Dg Chest Port 1 View  Result Date: 08/20/17 CLINICAL DATA:  Hematuria and diarrhea. History of coronary artery disease and CABG, bronchitis, former smoker. Patient has dementia. EXAM: PORTABLE CHEST 1 VIEW COMPARISON:  Chest x-ray of June 23, 2017 FINDINGS: The lungs are adequately inflated. The interstitial markings are mildly prominent though stable. There is no alveolar infiltrate or pleural effusion. The heart and pulmonary vascularity are normal. The patient has undergone previous CABG. The bony thorax exhibits no acute abnormality. IMPRESSION: Mild chronic bronchitic changes, stable. No acute cardiopulmonary abnormality. Electronically Signed   By: David  Martinique M.D.   On: 2017/08/20 15:54    Microbiology No results found for this or any previous visit (from the past 240 hour(s)).  Lab Basic Metabolic Panel: No results for input(s): NA, K, CL, CO2, GLUCOSE, BUN, CREATININE, CALCIUM, MG, PHOS in the last 168 hours. Liver Function Tests: No results for input(s): AST, ALT, ALKPHOS, BILITOT, PROT, ALBUMIN in the last 168 hours. No results for input(s): LIPASE, AMYLASE in the last 168 hours. No results for input(s): AMMONIA in the last 168 hours. CBC: No results for input(s): WBC, NEUTROABS, HGB, HCT, MCV, PLT in the last  168 hours. Cardiac Enzymes: No results for input(s): CKTOTAL, CKMB, CKMBINDEX, TROPONINI in  the last 168 hours. Sepsis Labs: No results for input(s): PROCALCITON, WBC, LATICACIDVEN in the last 168 hours.  Procedures/Operations     Keondre Markson R Marvella Jenning 08/05/2017, 12:44 PM

## 2017-08-17 NOTE — Progress Notes (Addendum)
Pt noted to be resting comfortably in bed with family at bedside. Support offered to family. Diet ordered discontinued at familys request. Morphine gtt infusing. Nasal cannula on for comfort.

## 2017-08-17 NOTE — Progress Notes (Signed)
Body prepared for funeral home. Family remains at bedside. Emotional support provided.

## 2017-08-17 DEATH — deceased

## 2017-10-31 IMAGING — CR DG HIP (WITH OR WITHOUT PELVIS) 2-3V*L*
3 series · 3 of 3 positions shown · non-contrast
Comparison: None.

CLINICAL DATA: Left hip pain.  Status post fall.

EXAM:
DG HIP (WITH OR WITHOUT PELVIS) 2-3V LEFT

[t pelvis ap]
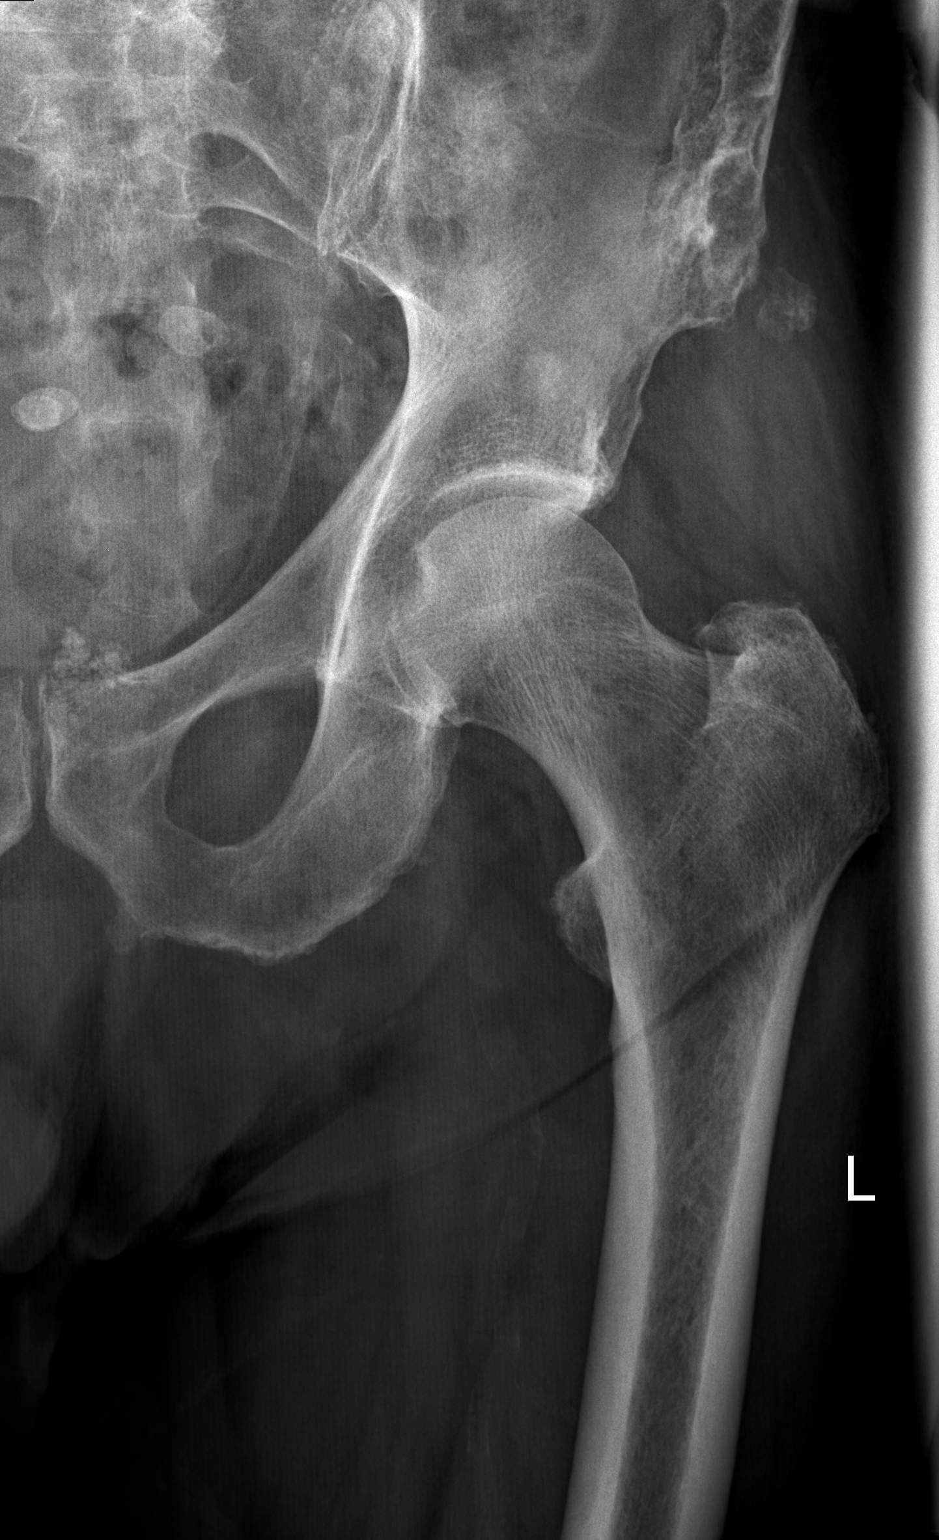

[t hip ap left]
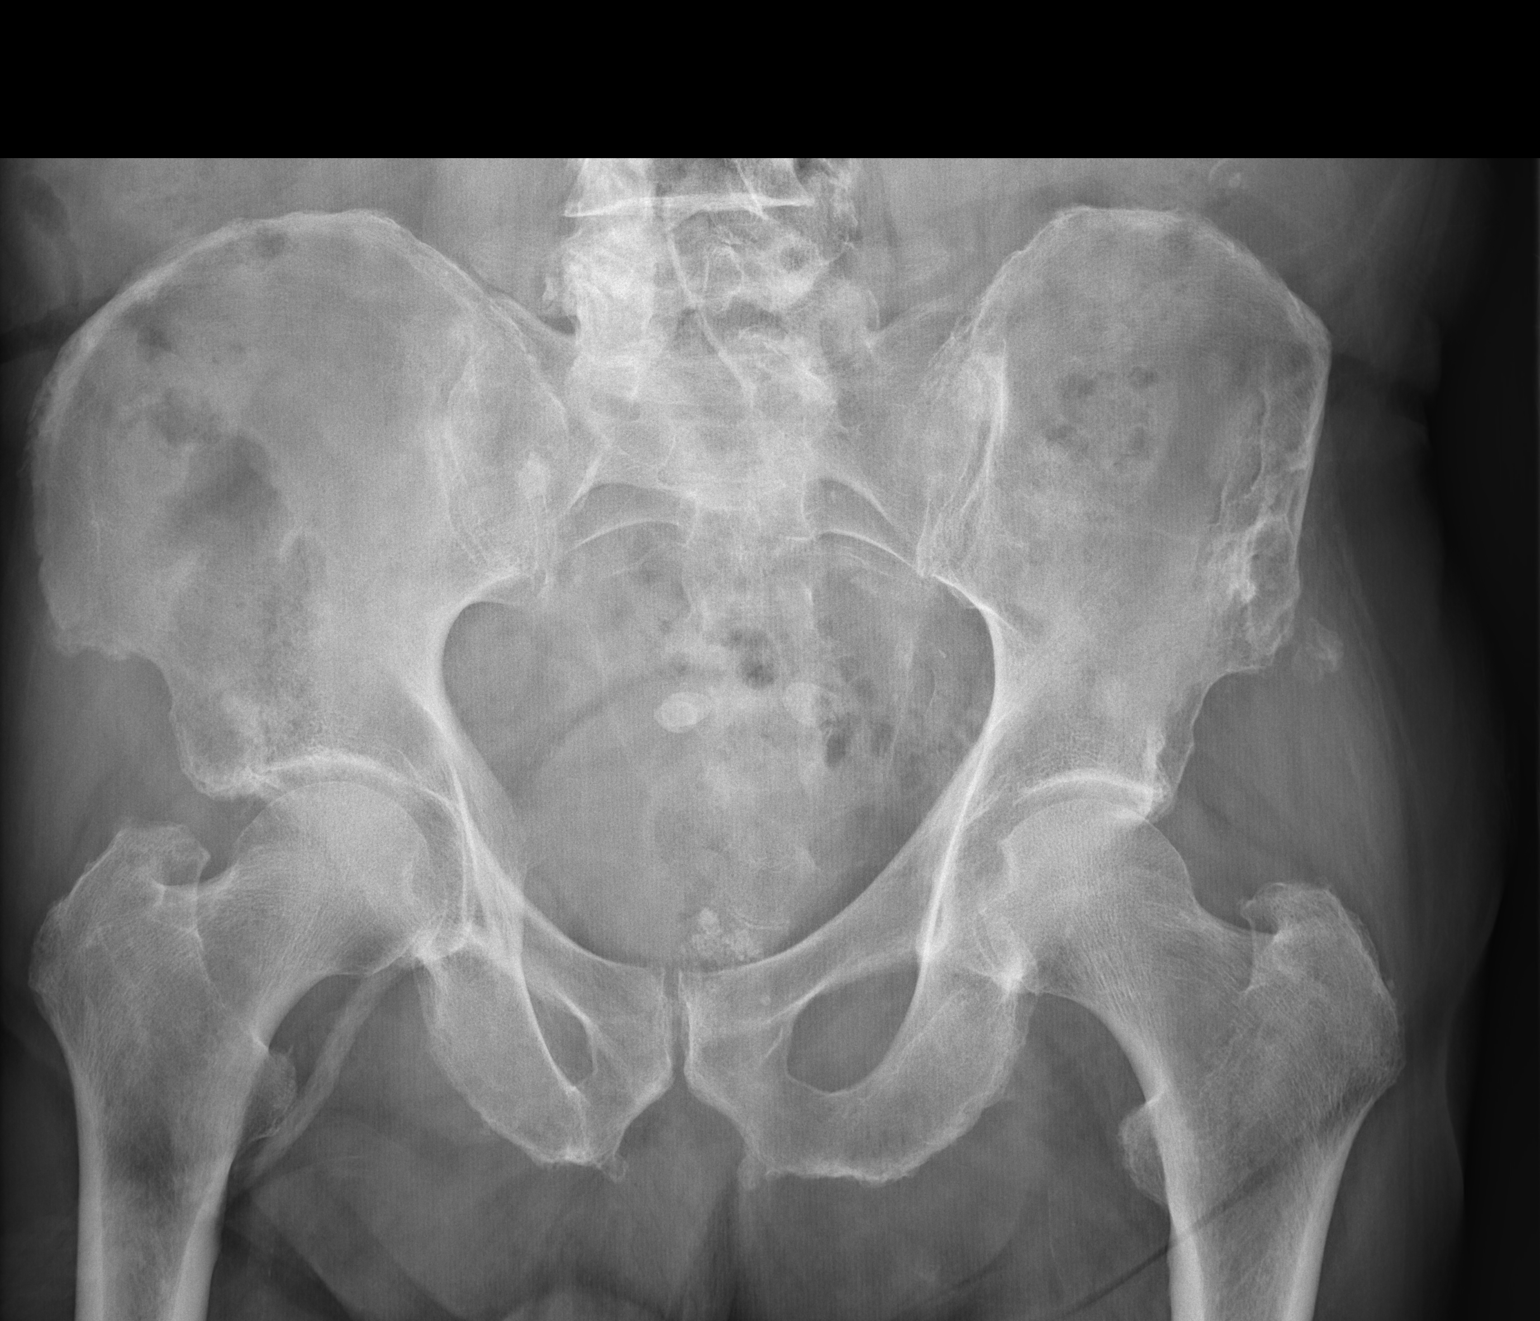

[t hip frog leg left]
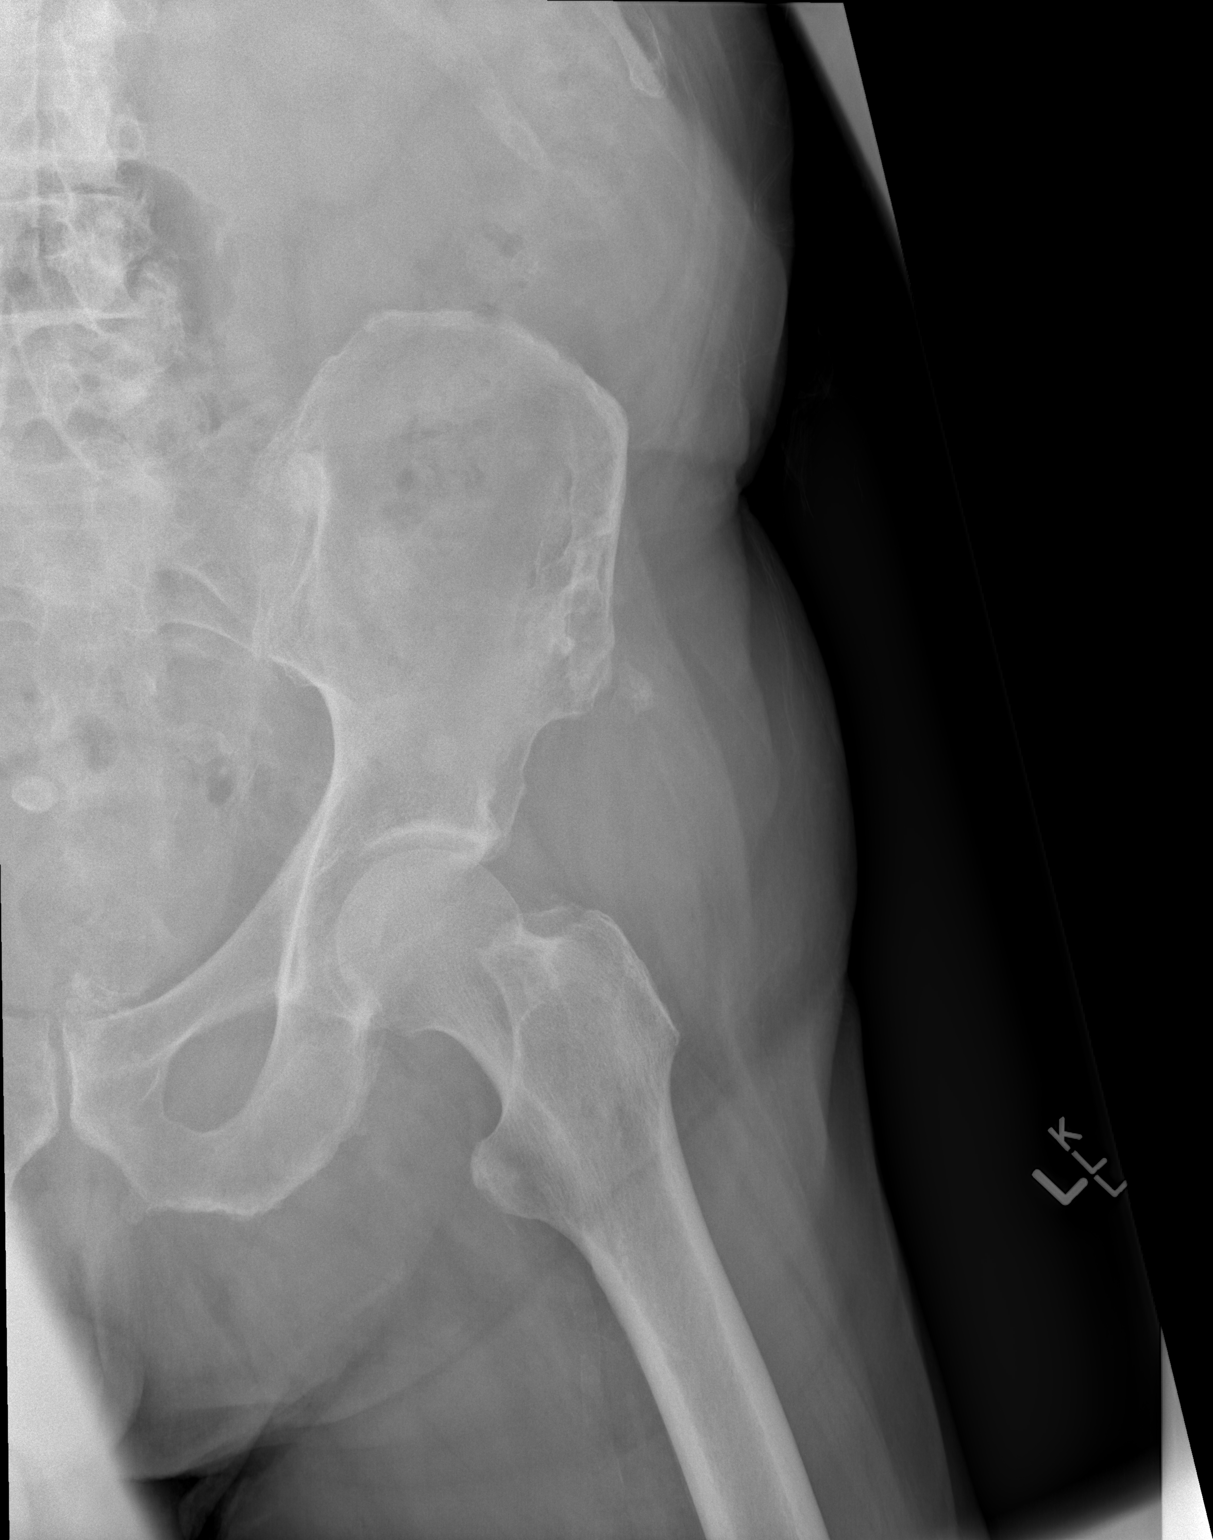

[3 of 3 positions shown; findings below may reference images not displayed]

FINDINGS: There is no evidence of hip fracture or dislocation. There is old
posttraumatic deformity of the left ilium. There is bridging
heterotopic ossification along the medial aspect of the proximal
right femur. There are mild degenerative changes of bilateral SI
joints. There is a sclerotic lesion in the left superior acetabulum
and other faint sclerotic lesions in the left ilium concerning for
metastatic prostate cancer.
IMPRESSION: No acute osseous injury of the left hip.

## 2019-05-27 IMAGING — US US EXTREM  UP VENOUS*L*
1 series · 13 of 24 positions shown · non-contrast
Comparison: None.

CLINICAL DATA: Left upper extremity swelling



[Series 1: us extrem up venous*left* · 0.08mm/px · 13 of 27 slices shown]
[im 1/27]
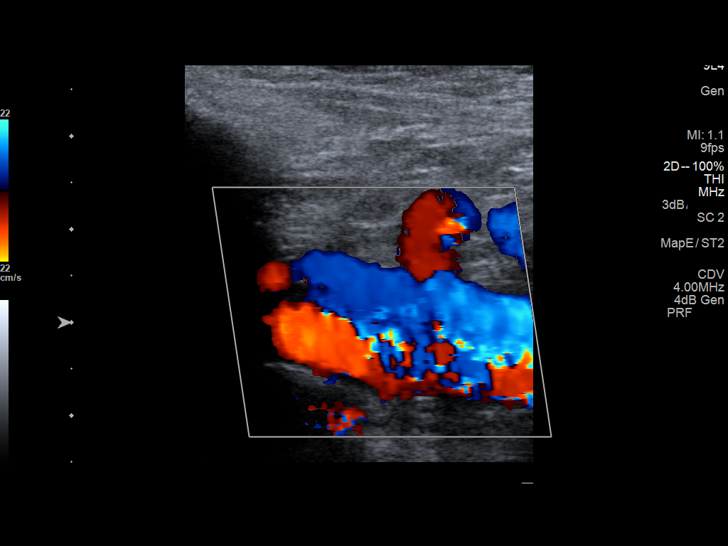
[im 3/27]
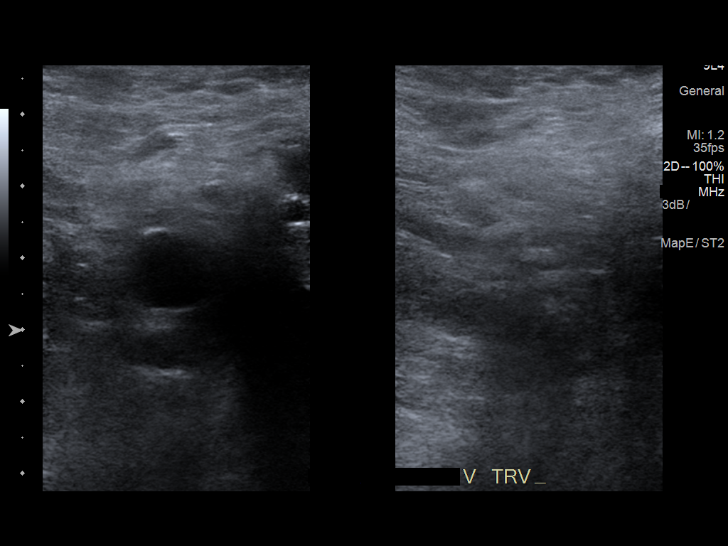
[im 5/27]
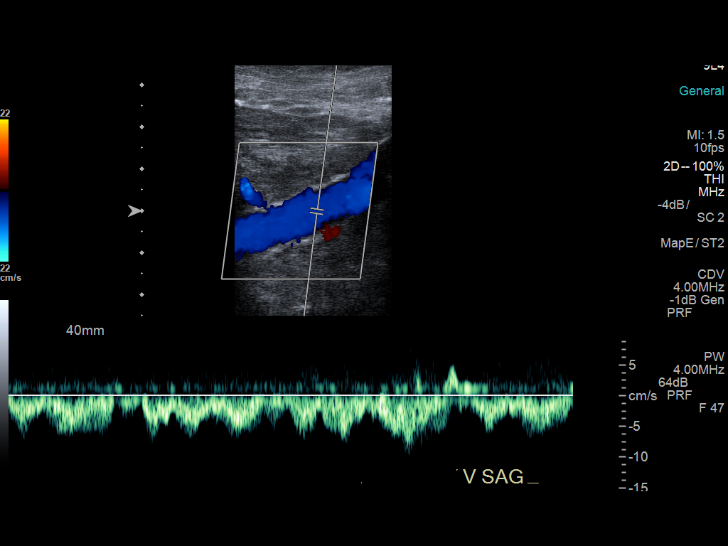
[im 7/27]
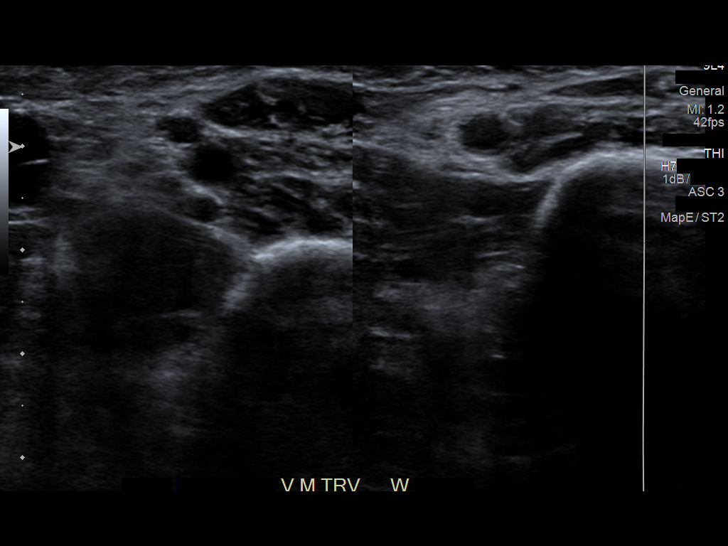
[im 10/27]
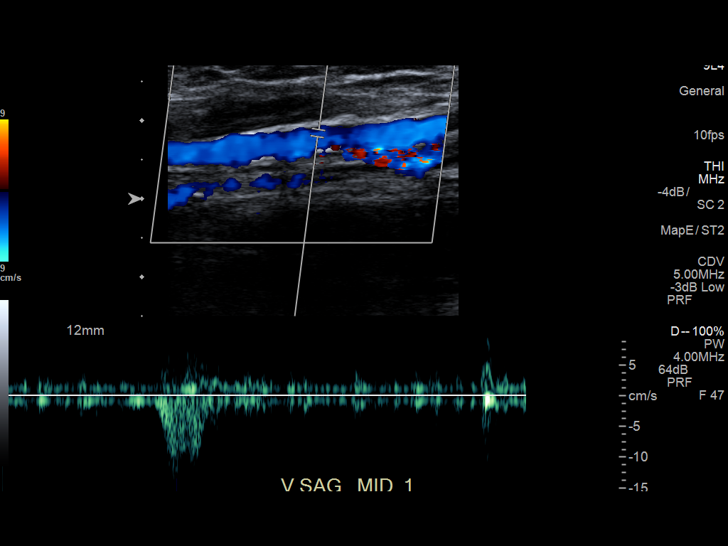
[im 12/27]
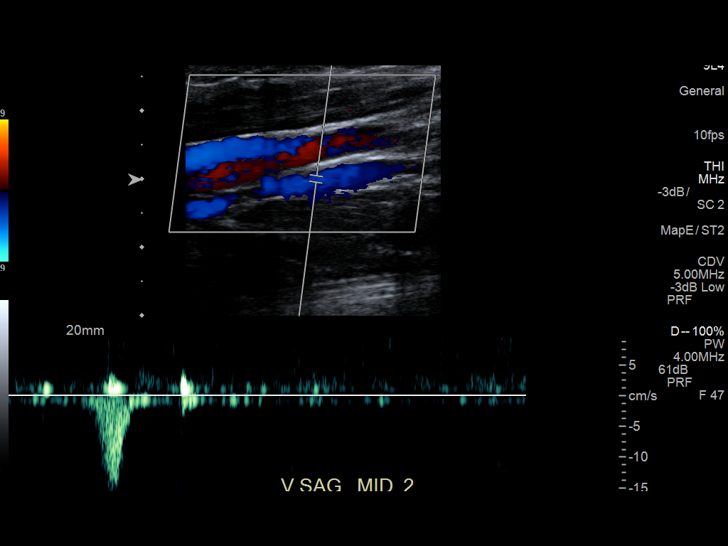
[im 14/27]
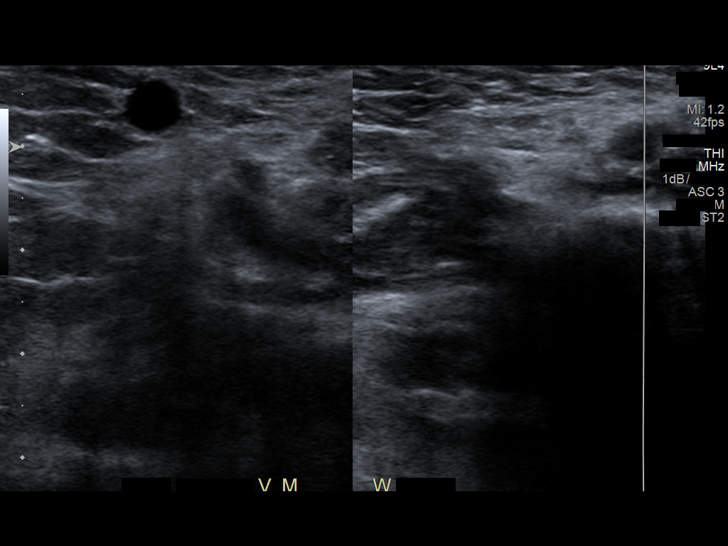
[im 15/27]
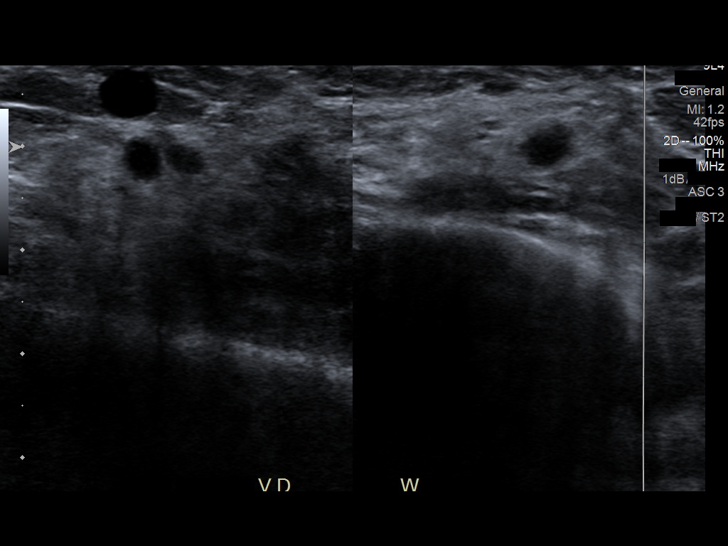
[im 17/27]
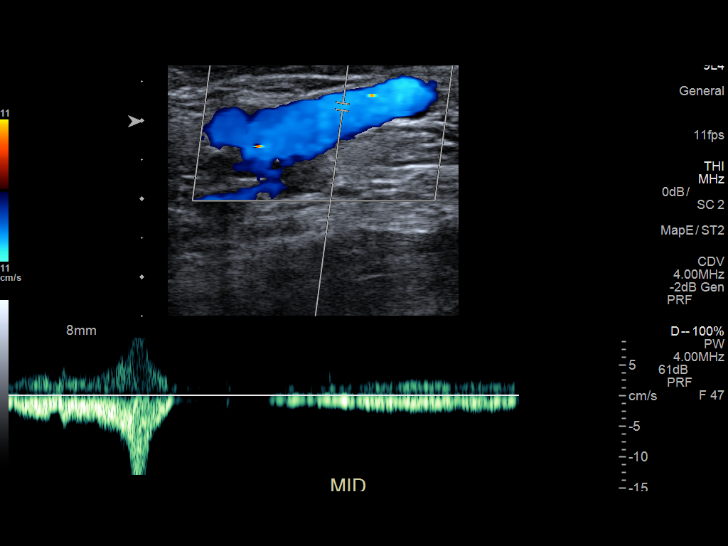
[im 20/27]
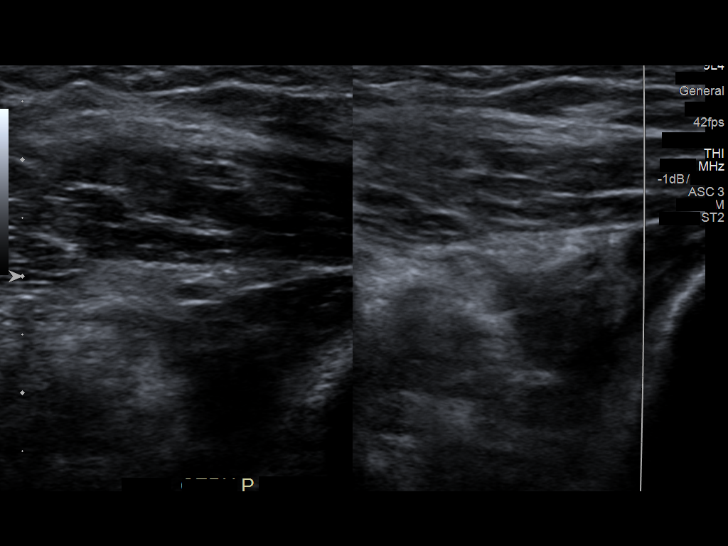
[im 22/27]
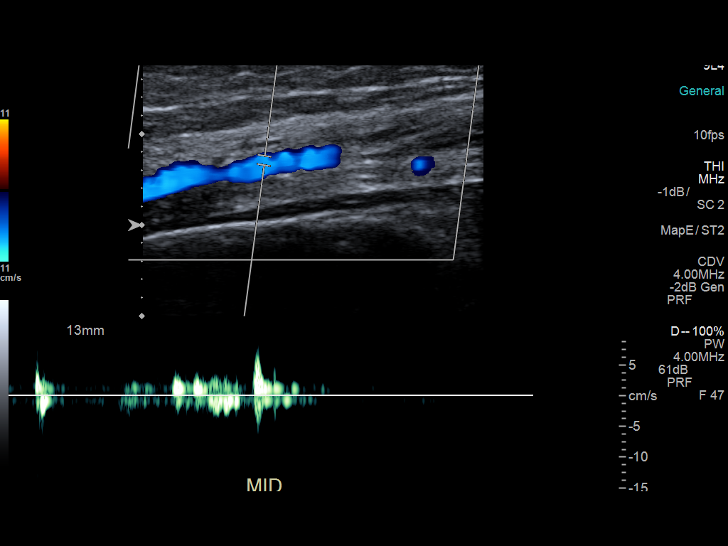
[im 24/27]
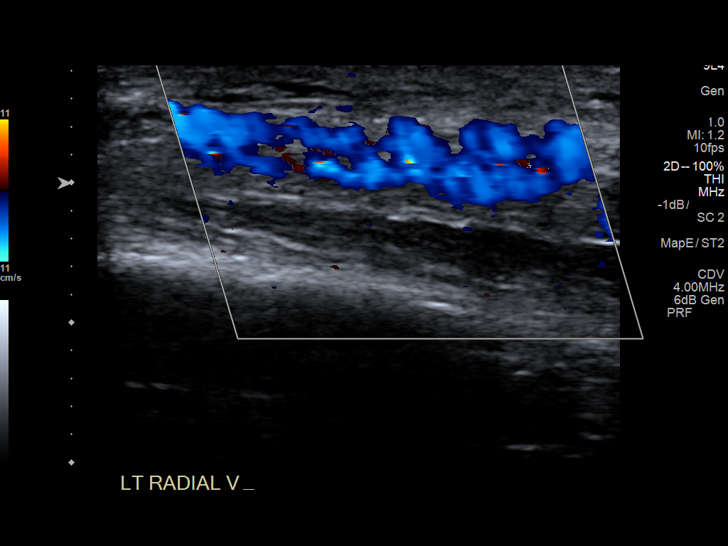
[im 27/27]
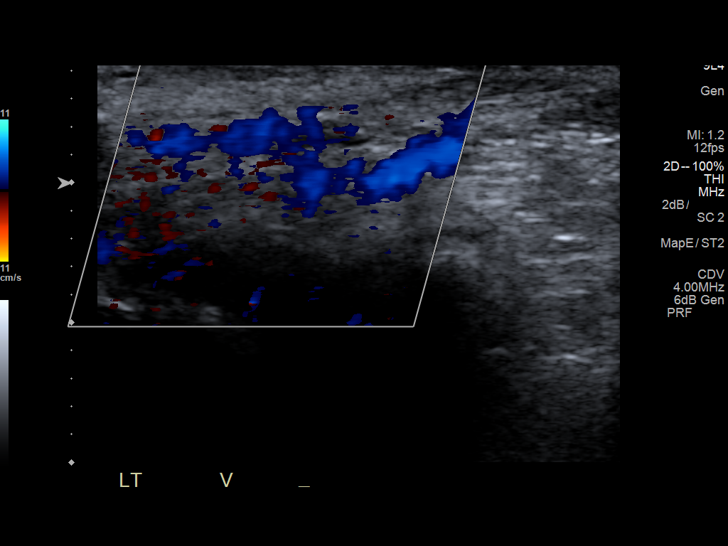

[13 of 24 positions shown; findings below may reference images not displayed]

FINDINGS: Contralateral Subclavian Vein: Respiratory phasicity is normal and
symmetric with the symptomatic side. No evidence of thrombus. Normal
compressibility.

Internal Jugular Vein: No evidence of thrombus. Normal
compressibility, respiratory phasicity and response to augmentation.

Subclavian Vein: No evidence of thrombus. Normal compressibility,
respiratory phasicity and response to augmentation.

Axillary Vein: No evidence of thrombus. Normal compressibility,
respiratory phasicity and response to augmentation.

Cephalic Vein: No evidence of thrombus. Normal compressibility,
respiratory phasicity and response to augmentation.

Basilic Vein: No evidence of thrombus. Normal compressibility,
respiratory phasicity and response to augmentation.

Brachial Veins: No evidence of thrombus. Normal compressibility,
respiratory phasicity and response to augmentation.

Radial Veins: No evidence of thrombus. Normal compressibility,
respiratory phasicity and response to augmentation.

Ulnar Veins: No evidence of thrombus. Normal compressibility,
respiratory phasicity and response to augmentation.

Venous Reflux:  None visualized.

Other Findings:  None visualized.
IMPRESSION: No evidence of DVT within the left upper extremity.
